# Patient Record
Sex: Female | Born: 1960 | Race: White | Hispanic: No | Marital: Married | Smoking: Never smoker
Health system: Southern US, Community
[De-identification: ages and names within clinical notes are randomized; demographics above are authoritative.]

## PROBLEM LIST (undated history)

## (undated) DIAGNOSIS — I10 Essential (primary) hypertension: Secondary | ICD-10-CM

## (undated) DIAGNOSIS — D219 Benign neoplasm of connective and other soft tissue, unspecified: Secondary | ICD-10-CM

## (undated) DIAGNOSIS — R768 Other specified abnormal immunological findings in serum: Secondary | ICD-10-CM

## (undated) DIAGNOSIS — G709 Myoneural disorder, unspecified: Secondary | ICD-10-CM

## (undated) DIAGNOSIS — K222 Esophageal obstruction: Secondary | ICD-10-CM

## (undated) DIAGNOSIS — E119 Type 2 diabetes mellitus without complications: Secondary | ICD-10-CM

## (undated) DIAGNOSIS — K219 Gastro-esophageal reflux disease without esophagitis: Secondary | ICD-10-CM

## (undated) DIAGNOSIS — E785 Hyperlipidemia, unspecified: Secondary | ICD-10-CM

## (undated) DIAGNOSIS — D259 Leiomyoma of uterus, unspecified: Secondary | ICD-10-CM

## (undated) DIAGNOSIS — E669 Obesity, unspecified: Secondary | ICD-10-CM

## (undated) DIAGNOSIS — Z87442 Personal history of urinary calculi: Secondary | ICD-10-CM

## (undated) DIAGNOSIS — Z8719 Personal history of other diseases of the digestive system: Secondary | ICD-10-CM

## (undated) HISTORY — DX: Essential (primary) hypertension: I10

## (undated) HISTORY — DX: Benign neoplasm of connective and other soft tissue, unspecified: D21.9

## (undated) HISTORY — DX: Obesity, unspecified: E66.9

## (undated) HISTORY — DX: Gastro-esophageal reflux disease without esophagitis: K21.9

## (undated) HISTORY — PX: COLONOSCOPY: SHX174

## (undated) HISTORY — DX: Hyperlipidemia, unspecified: E78.5

## (undated) HISTORY — DX: Leiomyoma of uterus, unspecified: D25.9

## (undated) HISTORY — DX: Other specified abnormal immunological findings in serum: R76.8

## (undated) HISTORY — PX: EXPLORATORY LAPAROTOMY: SUR591

## (undated) HISTORY — DX: Esophageal obstruction: K22.2

---

## 1999-05-27 ENCOUNTER — Other Ambulatory Visit: Admission: RE | Admit: 1999-05-27 | Discharge: 1999-05-27 | Payer: Self-pay | Admitting: Obstetrics and Gynecology

## 1999-08-23 ENCOUNTER — Encounter (INDEPENDENT_AMBULATORY_CARE_PROVIDER_SITE_OTHER): Payer: Self-pay

## 1999-08-23 ENCOUNTER — Ambulatory Visit (HOSPITAL_COMMUNITY): Admission: RE | Admit: 1999-08-23 | Discharge: 1999-08-23 | Payer: Self-pay | Admitting: Obstetrics and Gynecology

## 2000-02-12 ENCOUNTER — Encounter: Payer: Self-pay | Admitting: Family Medicine

## 2000-02-12 ENCOUNTER — Encounter: Admission: RE | Admit: 2000-02-12 | Discharge: 2000-02-12 | Payer: Self-pay | Admitting: Family Medicine

## 2000-06-30 ENCOUNTER — Other Ambulatory Visit: Admission: RE | Admit: 2000-06-30 | Discharge: 2000-06-30 | Payer: Self-pay | Admitting: Obstetrics and Gynecology

## 2001-07-23 ENCOUNTER — Other Ambulatory Visit: Admission: RE | Admit: 2001-07-23 | Discharge: 2001-07-23 | Payer: Self-pay | Admitting: Obstetrics and Gynecology

## 2002-08-23 ENCOUNTER — Other Ambulatory Visit: Admission: RE | Admit: 2002-08-23 | Discharge: 2002-08-23 | Payer: Self-pay | Admitting: Obstetrics and Gynecology

## 2003-09-18 ENCOUNTER — Other Ambulatory Visit: Admission: RE | Admit: 2003-09-18 | Discharge: 2003-09-18 | Payer: Self-pay | Admitting: Obstetrics and Gynecology

## 2004-10-28 ENCOUNTER — Other Ambulatory Visit: Admission: RE | Admit: 2004-10-28 | Discharge: 2004-10-28 | Payer: Self-pay | Admitting: Obstetrics and Gynecology

## 2005-06-23 ENCOUNTER — Ambulatory Visit: Payer: Self-pay | Admitting: Internal Medicine

## 2005-06-24 ENCOUNTER — Ambulatory Visit: Payer: Self-pay | Admitting: Cardiology

## 2005-07-10 ENCOUNTER — Ambulatory Visit: Payer: Self-pay | Admitting: Internal Medicine

## 2005-07-29 ENCOUNTER — Ambulatory Visit: Payer: Self-pay | Admitting: Internal Medicine

## 2005-09-08 HISTORY — PX: BREAST CYST EXCISION: SHX579

## 2006-01-29 ENCOUNTER — Encounter: Admission: RE | Admit: 2006-01-29 | Discharge: 2006-01-29 | Payer: Self-pay | Admitting: Obstetrics and Gynecology

## 2006-02-05 ENCOUNTER — Ambulatory Visit (HOSPITAL_COMMUNITY): Admission: RE | Admit: 2006-02-05 | Discharge: 2006-02-05 | Payer: Self-pay | Admitting: Obstetrics and Gynecology

## 2006-03-16 ENCOUNTER — Ambulatory Visit (HOSPITAL_BASED_OUTPATIENT_CLINIC_OR_DEPARTMENT_OTHER): Admission: RE | Admit: 2006-03-16 | Discharge: 2006-03-16 | Payer: Self-pay | Admitting: General Surgery

## 2006-03-16 ENCOUNTER — Encounter (INDEPENDENT_AMBULATORY_CARE_PROVIDER_SITE_OTHER): Payer: Self-pay | Admitting: General Surgery

## 2006-05-27 ENCOUNTER — Encounter: Admission: RE | Admit: 2006-05-27 | Discharge: 2006-05-27 | Payer: Self-pay | Admitting: Obstetrics and Gynecology

## 2006-09-08 HISTORY — PX: ABDOMINAL HYSTERECTOMY: SHX81

## 2007-05-04 ENCOUNTER — Encounter (INDEPENDENT_AMBULATORY_CARE_PROVIDER_SITE_OTHER): Payer: Self-pay | Admitting: Obstetrics and Gynecology

## 2007-05-04 ENCOUNTER — Ambulatory Visit (HOSPITAL_COMMUNITY): Admission: RE | Admit: 2007-05-04 | Discharge: 2007-05-05 | Payer: Self-pay | Admitting: Obstetrics and Gynecology

## 2007-08-11 ENCOUNTER — Ambulatory Visit (HOSPITAL_COMMUNITY): Admission: RE | Admit: 2007-08-11 | Discharge: 2007-08-11 | Payer: Self-pay | Admitting: Obstetrics and Gynecology

## 2007-09-03 ENCOUNTER — Ambulatory Visit: Payer: Self-pay | Admitting: Internal Medicine

## 2008-05-03 ENCOUNTER — Ambulatory Visit: Payer: Self-pay | Admitting: Cardiology

## 2010-06-19 ENCOUNTER — Encounter: Admission: RE | Admit: 2010-06-19 | Discharge: 2010-06-19 | Payer: Self-pay | Admitting: Obstetrics and Gynecology

## 2010-11-12 ENCOUNTER — Ambulatory Visit (INDEPENDENT_AMBULATORY_CARE_PROVIDER_SITE_OTHER): Payer: Self-pay | Admitting: Family Medicine

## 2010-11-12 DIAGNOSIS — Z713 Dietary counseling and surveillance: Secondary | ICD-10-CM

## 2011-01-21 NOTE — H&P (Signed)
NAMESUMIRE, HALBLEIB                 ACCOUNT NO.:  [id number]   MEDICAL RECORD NO.:  [id number]          PATIENT TYPE:  OIB   LOCATION:  9313                          FACILITY:  [facility]   PHYSICIAN:  Juluis Mire, M.D.   DATE OF BIRTH:  [birth year]   DATE OF ADMISSION:  05/04/2007  DATE OF DISCHARGE:                              HISTORY & PHYSICAL   The patient is a 50 year old nulligravida female presents for LAVH,  possible BSO.   HISTORY OF PRESENT ILLNESS:  The patient has had a longstanding history  of chronic pelvic pain.  She had previous laparoscopy and hysteroscopy  with findings of small fibroids and findings suggestive of adenomyosis  as well as some minimal pelvic endometriosis.  She is continued to have  pain and discomfort despite attempts at medical management.  Because of  this and increasing bleeding the patient now presents for laparoscopic-  assisted vaginal hysterectomy.  Recently she has been on Depo-Provera  and again with no response in terms of her pain.  She has had a  urological workup has revealed some minimal interstitial cystitis and  has been under evaluation by Loraine Leriche C. Vernie Ammons, M.D. and managed, she also  a complete GI evaluation with negative findings.   ALLERGIES:  The patient is allergic to DARVOCET.   MEDICATIONS:  Including Lipitor, multivitamins and calcium.   PAST MEDICAL HISTORY:  She had usual childhood disease without any  significant sequelae.  It is of note she does have that history of  interstitial cystitis for which she is under management.  She is also  had a history of cervical dysplasia treated cryotherapy in the past.   PAST SURGICAL HISTORY:  She has had a previous hysteroscopy, laparoscopy  in 2003 and has had previous breast surgery.   FAMILY HISTORY:  Mother has a history of hypertension.  Mother with  history of diabetes and heart disease.  Father has a history of heart  disease also.   SOCIAL HISTORY:  There is no tobacco or  alcohol use.   REVIEW OF SYSTEMS:  Noncontributory.   PHYSICAL EXAMINATION:  VITAL SIGNS:  The patient is afebrile, stable  vital signs.  HEENT:  Patient is normocephalic.  Pupils equal, round and reactive to  light and accommodation.  Extraocular movements were intact.  Sclerae  and conjunctivae are clear.  Oropharynx clear.  NECK:  Without thyromegaly.  BREASTS:  No discrete masses.  LUNGS:  Clear.  HEART:  Regular rhythm and rate without murmurs or gallops.  ABDOMEN:  Benign.  No mass, organomegaly or tenderness.  PELVIC:  Normal external genitalia.  Vaginal mucosa is clear.  Cervix  unremarkable.  Uterus upper limits of normal size irregular.  Adnexa  unremarkable.  EXTREMITIES:  Trace edema.  NEUROLOGY:  Grossly within normal limits.   IMPRESSION:  1. Continued pelvic pain and abnormal bleeding unresponsive      conservative management, possible adenomyosis.  2. Uterine fibroids.  3. Interstitial cystitis.  4. Hypercholesterolemia.   PLAN:  The patient will undergo laparoscopic assisted vaginal  hysterectomy, possible bilateral salpingo-oophorectomy.  If  ovaries are  removed the patient is understand the potential need for hormone  replacement therapy and its inherent risks.  The risks of surgery  explained including the risk of infection.  The risk of hemorrhage that  could require transfusion with the risk of AIDS or hepatitis.  Risk of  injury to adjacent organs including bladder, bowel, ureters that could  require further exploratory surgery.  Risk of deep venous thrombosis and  pulmonary emboli.  The patient does understand indications and risks.      Juluis Mire, M.D.  Electronically Signed     JSM/MEDQ  D:  05/04/2007  T:  05/04/2007  Job:  [id number]

## 2011-01-21 NOTE — Assessment & Plan Note (Signed)
[facility]                            CARDIOLOGY OFFICE NOTE   NAME:[name], Summer KAMAKA                        MRN:          [id number]  DATE:05/03/2008                            DOB:          08-Nov-1960    I was asked by Dr. Richardean Chimera to do a cardiovascular risk assessment on  [name].  She is 50 years of age and has a very strong family  history of coronary disease.  Her father specifically died of a massive  heart attack at age 89, was a nonsmoker.  She has a sister, at around  age 66 had several coronary stents placed.  She also had a brother who  died of heart attack at age 46, but he had diabetes, hyperlipidemia, and  hypertension.   I came to find out, she is on Crestor 5 mg a day with Dr. Nadyne Coombes.  She is due for followup blood work in about 4-6 weeks.  She has been on  Lipitor for about 5 years and her last lipid profile on 10 of Lipitor  showed a total cholesterol of 155, HDL 41, triglycerides of 93, LDL of  95.  Her HDL in the past has been in the low 30s.  Her LFTs were normal.   Fortunately she does not smoke.  She exercises on a regular basis,  walking daily greater than 3 hours a week.  She has no diabetes.  She  has no history of hypertension.   PAST MEDICAL HISTORY:  She is currently on Crestor 5 mg a day, she is on  81 mg of aspirin.  She is on calcium, multivitamin, and imipramine 25  mg.   ALLERGIES:  She has intolerance to DARVOCET, it causes nausea.  She has  no history of dye allergy.   PAST SURGICAL HISTORY:  She had a hysterectomy in August 2008 and breast  cyst removal in July 2007.   FAMILY HISTORY:  As above.   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  She is married and has no children.   REVIEW OF SYSTEMS:  All negative.  All systems queried.   PHYSICAL EXAMINATION:  VITAL SIGNS:  Her blood pressure today is 114/76,  her pulse was 107, she is kind of anxious.  She  says her heart rate is  usually normal.  She has had recent CBC and thyroid profile.  Her weight  is 153.  She is 5 feet and 3 inches.  GENERAL:  She has a plethoric color to her face, which might be just  from anxiety.  She is in no acute distress.  Alert and oriented x3.  SKIN:  Warm and dry.  HEENT:  Otherwise is negative.  Carotid upstrokes are equal bilaterally  without bruits.  No JVD.  Thyroid is not enlarged.  Trachea is midline.  LUNGS:  Clear.  HEART:  Reveals a regular rate and rhythm.  No gallop.  ABDOMEN:  Soft.  Good bowel sounds.  No midline bruit.  EXTREMITIES:  No cyanosis, clubbing,  or edema.  Pulses are intact.  NEURO:  Intact.   Electrocardiogram today shows sinus tachycardia, otherwise normal.   I have had a long talk with [name] today.  She is doing all what she  can to reduce her risk of cardiovascular disease in the future.  She is  being managed by Dr. Nadyne Coombes who does an excellent job.  I have  advised to get followup lipids on 5 of Crestor.  Her goal LDL, I would  treat at 70 or as close to 70 as possible.  Hope her HDL will remain  higher than it has in the past.  I will see her back on a p.r.n. basis.     Thomas C. Daleen Squibb, MD, [facility]  Electronically Signed    TCW/MedQ  DD: 05/03/2008  DT: 05/04/2008  Job #: [id number]   cc:   Juluis Mire, M.D.  Marcos Eke. Hal Hope, M.D.

## 2011-01-21 NOTE — Assessment & Plan Note (Signed)
[facility]                         GASTROENTEROLOGY OFFICE NOTE   NAME:[name], [name]                        MRN:          [id number]  DATE:09/03/2007                            DOB:          03-11-1961    REFERRING PHYSICIAN:  Juluis Mire, M.D.   REASON FOR CONSULTATION:  Chronic abdominal pain and abnormal CT scan.   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 Arelia Sneddon regarding left-sided  abdominal pain and abnormal CT scan. The patient has had chronic left-  sided pain for at least 8 years. She was evaluated in this office in  October 2006 for the same. At that time, she underwent laboratories and  urinalysis. These were unremarkable. She also underwent a CT scan of the  abdomen and pelvis. She was noted to have a uterine fibroid. No other  abnormalities. The rectosigmoid colon was incompletely opacified. She  subsequently underwent complete colonoscopy July 29, 2005. This was  entirely normal including visualization of the ileum. She was not  suspected to have a GI cause for her pain. Since that time, she tells me  that she underwent hysterectomy in hopes of alleviating the pain.  Unfortunately this did not help. She does have a history of  endometriosis for which she underwent surgical therapy about 8 years  ago. She underwent repeat CT scan at [facility] August 11, 2007.  There was a left-sided ovarian cyst. As well, a suggestion of thickening  of the sigmoid colon which have possibly been related to under  distention of the organ. In terms of the patient's discomfort, she tells  me that it occurs daily and has so for years. She describes it as a  knife twisting in her side. The intensity can vary but generally bothers  her most of the day. She sleeps okay without pain disrupting her sleep  pattern. She has taken Advil which eases the discomfort. She has also  tried  Vicodin though this makes her a bit too sleepy. She describes her  bowel habits as normal. Despite this, she was given fiber and MiraLax to  see if this would help the pain. No effect on the pain. She has had  gradual weight gain since her last office visit here though recently  losing some weight by intent as she is on a diet. No problems with back  pain. No urinary complaints, no bleeding.   CURRENT MEDICATIONS:  Lipitor, baby aspirin and calcium. She is  intolerant to DARVOCET which cause nausea.   PAST MEDICAL HISTORY:  1. Hyperlipidemia.  2. Endometriosis status post laparoscopy about 8 years ago.  3. Status post hysterectomy.   FAMILY HISTORY:  Negative for gastrointestinal disorders.   SOCIAL HISTORY:  The patient is married without children. 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   PHYSICAL EXAMINATION:  Well-appearing female in no acute distress.  Blood pressure is 122/70, heart rate 72, weight is 148.2 pounds.  HEENT:  Sclera anicteric, conjunctiva are pink.  LUNGS:  Clear.  HEART:  Regular.  ABDOMEN:  Soft. There is regional tenderness in the left mid abdomen,  left lower quadrant, in the muscle layer. No mass, good bowel sounds  heard.  EXTREMITIES:  Without edema.   IMPRESSION:  1. Abdominal wall tenderness. The patient's chronic pain is muscular      wall tenderness and not a primary gastrointestinal disorder.  2. Questionable thickening of the sigmoid colon on CT scan. A similar      finding two years ago with subsequent negative colonoscopy. Indeed,      I believe this is artifact secondary under distention, as      suggested, rather than true pathology.   RECOMMENDATIONS:  The patient can consider a heating pad to her abdomen.  As well, she can be given symptomatic therapies per her primary care  physician or even be referred to a pain clinic if these measures are not  effective and the discomfort affects her quality of  life. She needs no  further GI evaluation or therapies.     Wilhemina Bonito. Marina Goodell, MD  Electronically Signed    JNP/MedQ  DD: 09/03/2007  DT: 09/03/2007  Job #: [id number]   cc:   Juluis Mire, M.D.  Marcos Eke. Hal Hope, M.D.

## 2011-01-21 NOTE — Op Note (Signed)
[name], [name]                 ACCOUNT NO.:  [id number]   MEDICAL RECORD NO.:  [id number]          PATIENT TYPE:  OIB   LOCATION:  9313                          FACILITY:  [facility]   PHYSICIAN:  Juluis Mire, M.D.   DATE OF BIRTH:  19-Nov-1960   DATE OF PROCEDURE:  05/04/2007  DATE OF DISCHARGE:                               OPERATIVE REPORT   PREOPERATIVE DIAGNOSIS:  Menorrhagia, dysmenorrhea unresponsive  conservative management with known uterine fibroids.   POSTOPERATIVE DIAGNOSIS:  Menorrhagia, dysmenorrhea unresponsive  conservative management with known uterine fibroids.  Pathology pending.   PROCEDURES:  Laparoscopic-assisted vaginal hysterectomy.   SURGEON:  Juluis Mire, M.D.   ASSISTANT:  Zelphia Cairo, M.D.   ESTIMATED BLOOD LOSS:  Was 200 mL.   PACKS AND DRAINS:  None.   BLOOD REPLACED:  None.   COMPLICATIONS:  None.   INDICATIONS:  Noted in history and physical.   DESCRIPTION OF PROCEDURE:  The patient was taken to OR, placed supine  position.  After satisfactory level of general endotracheal anesthesia  obtained, the patient was placed in dorsal lithotomy position using the  Allen stirrups.  The abdomen, perineum and vagina were prepped out  Betadine.  A Hulka tenaculum put in place.  Bladder was emptied by in-  and-out catheterization.  Patient draped in sterile field.  A  subumbilical incision made knife extend to the fascia. Fascia was  entered sharply and incision to fascia extended laterally. We were able  to separate the rectus muscles into the peritoneum with blunt pressure.  An open laparoscopic trocar was put in place and secured. The abdomen  inflated with carbon dioxide.  Laparoscope was introduced.  A 5-mm  trocar was put in place in suprapubic area under direct visualization.  Visualization revealed uterus be enlarged with multiple fibroids.  Tubes  and ovaries were unremarkable with no evidence of pelvic pathology.  The  view of the upper  abdomen was benign, liver was smooth and unremarkable,  tip the gallbladder was seen and the appendix was noted be normal.  This  point time using the gyrus the right ovarian pedicle was cauterized  incised, the right tube and mesosalpinx as well as the right round  ligament cauterized incised.  Then went the left side with left utero-  ovarian pedicle was cauterized incised, the left tube and mesosalpinx  cauterized incised, the left round ligament cauterized incised. We had  good freeing of the uterus and good hemostasis.   Laparoscope was removed.  The abdomen was deflated of carbon dioxide.  The Hulka tenaculum was then taken out, the patient's legs were  repositioned.  Weighted speculum placed in vaginal vault.  The cervix  was grasped with Christella Hartigan tenaculum.  Cul-de-sac was entered sharply.  Both uterosacral ligaments were clamped, cut and suture ligated 0  Vicryl.  Reflection vaginal mucosa anteriorly was incised and bladder  was dissected superiorly. Using clamp, cut and tie technique with suture  ligature of 0 Vicryl the parametrium serially separated from side the  uterus. Vesicouterine space was entered and retractors put in place.  Uterus was then flipped, remaining pedicles were clamped and cut.  Uterus and cervix were passed off the operative field sent to pathology.  Held pedicles secured with three free ties of 0 Vicryl.  Uterosacral  plication stitch of 0 Vicryl was put in place and secured.  Vaginal  mucosa was closed with figure-of-eights of 0 Monocryl.  We had good  hemostasis and Foley was placed straight drain with clear urine output.   Laparoscope was reintroduced and abdomen inflated carbon dioxide.  Visualization revealed basically good hemostasis in all areas, small  amount and noted from the right side of the vaginal cuff brought under  control with the gyrus.  This point time and abdomen deflated carbon  dioxide.  All trocars removed.  Subumbilical fascia  closed figure-of-  eight of 0 Vicryl, skin with interrupted subcuticulars of 4-0 Vicryl.  Suprapubic incision closed Dermabond.  Sponge, instrument, and needle  count was correct by circulating nurse x2.  The patient tolerated the  procedure well and once alert and extubated, transferred to recovery  room in good condition.      Juluis Mire, M.D.  Electronically Signed     JSM/MEDQ  D:  05/04/2007  T:  05/04/2007  Job:  [id number]

## 2011-01-21 NOTE — Discharge Summary (Signed)
[name], [name]                 ACCOUNT NO.:  [id number]   MEDICAL RECORD NO.:  [id number]          PATIENT TYPE:  OIB   LOCATION:  9313                          [facility]ACILITY[facility]   PHYSICIAN:  Juluis Mire, M.D.   DATE OF BIRTH:  08/10/1961   DATE OF ADMISSION:  05/04/2007  DATE OF DISCHARGE:  05/05/2007                               DISCHARGE SUMMARY   POSTOPERATIVE DIAGNOSIS:  Uterine fibroids.   POSTOPERATIVE DIAGNOSIS:  Uterine fibroids.   OPERATIVE PROCEDURE:  Laparoscopic-assisted vaginal hysterectomy.   For complete history and physical, please see dictated note.   COURSE IN THE HOSPITAL:  The patient under the above-noted surgery.  Postoperatively, did well.  Postoperative hemoglobin was 12.1.  Discharged home the first postoperative day.  Pathology is still  pending.  At the time of discharge she was afebrile with stable vital  signs.  She was tolerating a regular diet and voiding without  difficulty.  Her abdominal exam is benign, all incisions were intact,  and bowel sounds were active.  She had no active vaginal bleeding.   COMPLICATIONS:  None encountered during her stay in the hospital.  The  patient is discharged home in stable condition.   DISPOSITION:  Routine postoperative instructions and orders were given.  She is to avoid heavy lifting, vaginal entrance, and should be careful  driving a car.  She should watch for signs of infection, nausea,  vomiting, increasing abdominal pain, or active vaginal bleeding.  She  also instructed to watch for signs of deep venous thrombosis and  pulmonary embolus.  Follow up in the office in 1 week.  Discharged home  on Vicodin as she needs for pain.      Juluis Mire, M.D.  Electronically Signed     JSM/MEDQ  D:  05/05/2007  T:  05/05/2007  Job:  [id number]

## 2011-01-24 NOTE — Op Note (Signed)
NAMEJULLIANNA, [name]                 ACCOUNT NO.:  [id number]   MEDICAL RECORD NO.:  [id number]          PATIENT TYPE:  AMB   LOCATION:  DSC                          FACILITY:  [facility]   PHYSICIAN:  Gabrielle Dare. Janee Morn, M.D.DATE OF BIRTH:  [birth year]   DATE OF PROCEDURE:  DATE OF DISCHARGE:                                 OPERATIVE REPORT   PREOPERATIVE DIAGNOSIS:  Right breast mass.   POSTOPERATIVE DIAGNOSIS:  Right breast mass.   PROCEDURES:  Right breast biopsy   SURGEON:  Violeta Gelinas, MD   HISTORY OF PRESENT ILLNESS:  The patient is a 50 year old female who I  evaluated for a right breast mass deep to the inferior edge of her areola.  Mammographic findings were indeterminate.  However due to the superficial  nature of it, surgical resection was recommended.   PROCEDURE IN DETAIL:  Informed consent was obtained.  The patient's site was  marked.  She received intravenous antibiotics.  She was brought to the  operating room.  MAC anesthesia was administered.  Her right breast was  prepped and draped in sterile fashion.  A mixture of 0.25% Marcaine and 1%  lidocaine was injected along the inferior portion of her areola.  The  incision was made along the lower edge of her areola.  Subcutaneous tissues  were dissected down.  This area was easily palpable.  It was  circumferentially dissected and about 5-6 mm in size.  A small extra piece  of tissue was also taken both superficial and deep to this area.  Specimens  were sent to pathology.  The wound was copiously irrigated.  Some additional  local anesthetic was injected.  Further palpation revealed no other  abnormalities.  Meticulous hemostasis was assured and the wound was closed  in two layers with the subcutaneous tissues approximated with interrupted 3-  0 Vicryl sutures and the skin closed with a running 4-0 Monocryl  subcuticular stitch.  Sponge, needle and instrument counts were correct.  Benzoin, Steri-Strips and sterile  dressings were applied.  The patient  tolerated the procedure well without apparent complication and was taken  recovery in stable condition.      Gabrielle Dare Janee Morn, M.D.  Electronically Signed     BET/MEDQ  D:  03/16/2006  T:  03/16/2006  Job:  [id number]   cc:   Bryan Lemma. Manus Gunning, M.D.  Fax: [phone]   Juluis Mire, M.D.  Fax: [phone]

## 2011-01-24 NOTE — H&P (Signed)
[name], [name]                 ACCOUNT NO.:  [id number]   MEDICAL RECORD NO.:  [id number]          PATIENT TYPE:  AMB   LOCATION:  SDC                           FACILITY[facility]   PHYSICIAN:  Juluis Mire, M.D.   DATE OF BIRTH:  Jun 20, 1961   DATE OF ADMISSION:  02/05/2006  DATE OF DISCHARGE:                                HISTORY & PHYSICAL   The patient is a 50 year old nulligravida married female who presents for  laparoscopic evaluation.   In relation to the present admission, the patient has a history of chronic  left lower quadrant discomfort.  Previous laparoscopic evaluation had been  performed in 2000 with finding of endometriosis that was treated.  She also  had a hysteroscopy at that time.  Pain has become more persistent and more  uncomfortable.  She has undergone GI evaluation with negative findings.  She  is being managed for interstitial cystitis, but this remains relatively  asymptomatic.  She has undergone ultrasound evaluation, which had been  unremarkable.  In view of the continued pain and discomfort, the patient  wished to have repeat laparoscopy to evaluate for recurrent endometriosis,  for which she is brought in at the present time.   In terms of allergies, she is allergic to DARVOCET.   MEDICATIONS:  1.  Lipitor 10 mg a day.  2.  Yasmin for birth control purposes.   PAST SURGICAL AND OBSTETRICAL HISTORY:  Past surgical history includes the  noted laparoscopy and hysteroscopy.  She has no obstetrical history.   FAMILY HISTORY:  Mother has a history of hypertension.  Father and brother  have what is described as heart disease, and brother has diabetes.   SOCIAL HISTORY:  No tobacco or alcohol use.   REVIEW OF SYSTEMS:  Noncontributory.   PHYSICAL EXAMINATION:  VITAL SIGNS:  The patient is afebrile with stable  vital signs.  HEENT:  The patient is normocephalic.  Pupils equal, round and reactive to  light and accommodation.  Extraocular movements  were intact.  Sclerae and  conjunctivae were clear.  Oropharynx clear.  NECK:  Without thyromegaly.  LUNGS:  Clear.  CARDIAC:  Regular rhythm and rate.  No murmurs or gallops.  ABDOMEN:  Benign.  No mass, organomegaly or tenderness.  PELVIC:  Normal external genitalia.  Vaginal mucosa clear.  Cervix  unremarkable.  Uterus normal size, shape and contour.  Adnexa free of masses  or tenderness.  EXTREMITIES:  Trace edema.  NEUROLOGIC:  Grossly within normal limits.   IMPRESSION:  1.  Persistent pelvic pain, possible recurrent endometriosis.  2.  Hypercholesterolemia.   PLAN:  The patient will undergo laparoscopic evaluation with a laser  standby.  The risks of surgery have been discussed, including the risk of  infection; the risk of hemorrhage that could require transfusion and the  risk of AIDS or hepatitis; the risk of injury to adjacent organs including  bladder, bowel or ureters that could require further exploratory surgery;  the risk of deep venous thrombosis and pulmonary embolus.  The patient  expressed an understanding of indications and  risks.      Juluis Mire, M.D.  Electronically Signed     JSM/MEDQ  D:  02/05/2006  T:  02/05/2006  Job:  [id number]

## 2011-01-24 NOTE — Op Note (Signed)
[name], [name]                 ACCOUNT NO.:  [id number]   MEDICAL RECORD NO.:  [id number]          PATIENT TYPE:  AMB   LOCATION:  SDC                           FACILITY:  [facility]   PHYSICIAN:  Juluis Mire, M.D.   DATE OF BIRTH:  1961-03-06   DATE OF PROCEDURE:  02/05/2006  DATE OF DISCHARGE:                                 OPERATIVE REPORT   PREOPERATIVE DIAGNOSIS:  Chronic pelvic pain, rule out endometriosis.   POSTOPERATIVE DIAGNOSIS:  Multiple uterine fibroids.   PROCEDURE:  Open laparoscopy.   SURGEON:  Juluis Mire, M.D.   ANESTHESIA:  General.   ESTIMATED BLOOD LOSS:  Minimal.   PACKS AND DRAINS:  None.   INTRAOPERATIVE BLOOD REPLACED:  None.   COMPLICATIONS:  None.   INDICATIONS FOR PROCEDURE:  As dictated in the history and physical.   DESCRIPTION OF PROCEDURE:  The patient was taken to the OR and placed in the  supine position.  After a satisfactory level of general anesthesia was  obtained, the patient was placed in the dorsal lithotomy position using the  Allen stirrups.  The abdomen, perineum, and vagina were prepped out with  Betadine.  The bladder was emptied via catheterization.  A Hulka tenaculum  was put in place and secured.  The patient was then draped as a sterile  field.   A subumbilical incision was made with the knife and extended to the  subcutaneous tissue.  The fascia was identified and entered sharply and the  incision in the fascia extended laterally.  The peritoneum was entered with  blunt pressure.  The laparoscopic trocar was put in place and secured.  The  laparoscope was introduced.  There was no evidence of injury to adjacent  organs.  A 5-mm trocar was put in place in the suprapubic area under direct  visualization.  Visualization revealed both ovaries to be normal.  The  uterus was enlarged with multiple fibroids.  There was no active  endometriosis or other signs of pelvic pathology.  The appendix was  visualized and noted to  be normal.  The upper abdomen, including the liver  and tip of the gallbladder were clear.  At this point in time, the abdomen  was deflated of the carbon dioxide.  All trocars were removed.  The  suprapubic incision fascia was closed with two figure-of-eight sutures of 0  Vicryl, the skin with interrupted subcuticular sutures of 4-0 Vicryl, and  the suprapubic incision was closed with Dermabond.  The Hulka tenaculum was  then removed.   The patient was taken out of the dorsal lithotomy position and once alert  and extubated was transferred to the recovery room in good condition.  Sponge, instrument, and needle counts were reported as correct by the  circulating nurse.      Juluis Mire, M.D.  Electronically Signed     JSM/MEDQ  D:  02/05/2006  T:  02/05/2006  Job:  [id number]

## 2011-03-26 ENCOUNTER — Ambulatory Visit: Payer: Self-pay | Admitting: *Deleted

## 2011-04-03 ENCOUNTER — Encounter: Payer: Self-pay | Admitting: *Deleted

## 2011-04-03 ENCOUNTER — Encounter: Payer: 59 | Attending: Family Medicine | Admitting: *Deleted

## 2011-04-03 DIAGNOSIS — R7309 Other abnormal glucose: Secondary | ICD-10-CM | POA: Insufficient documentation

## 2011-04-03 DIAGNOSIS — I10 Essential (primary) hypertension: Secondary | ICD-10-CM | POA: Insufficient documentation

## 2011-04-03 DIAGNOSIS — Z713 Dietary counseling and surveillance: Secondary | ICD-10-CM | POA: Insufficient documentation

## 2011-04-03 NOTE — Patient Instructions (Addendum)
Goals:  1200-1400 calories per day   Follow Diabetes Meal Plan as instructed (yellow card).  Eat 3 meals and 2 snacks, every 3-5 hrs  Limit sodium to <2000 mg and cholesterol to <300 mg daily  Aim for 25-30 g of fiber daily  Add lean protein foods to meals/snacks  Monitor glucose levels as instructed by your doctor  Aim for >30-45 mins of physical activity daily  Bring glucose log and food record to your next nutrition visit

## 2011-04-03 NOTE — Progress Notes (Signed)
  Medical Nutrition Therapy:  Appt start time: 0730 end time:  0830.   Assessment:  Primary concerns today: Pre-DM, Hypertension, Elevated TG. Pt here through [employer] for weight mgmt and achievement of prediabetes mgmt goals.  Pt has elevated BP (143/91 on 02/24/11), TGs (225 mg/dL on 16/1/09), and an U0A of 6.4% (11/13/2010).  Checks BG BID; fasting and 2 hrs post-prandial (rotates between lunch/dinner). Recent FBGs: 95-120 mg/dL; PP: 540-981 mg/dL.  Reports dietary changes including decreased soda from 5-6 cans to 1 can a day ~6 mos ago. Pt exercises regularly and does not eat increased amounts of red meat.   MEDICATIONS/SUPPLEMENTS: Losartan-HCTZ, buproprion, ASA 81mg , imipramine, crestor, nexium, caltrate 600+D    DIETARY INTAKE:  Usual eating pattern includes 3 meals and 1-2 snacks per day.  24-hr recall:  B ( AM): bagel w/ cream cheese; orange Hi-C juice box  Snk ( AM): apple  L ( PM): Chicken pot pie, grapes, 12 oz pepsi Snk ( PM): BBQ chips (1-2 oz) D ( PM): Lean Cuisine (spaghetti) Snk ( PM): none *Pt reports additional intake of ~50 oz of water daily.  Usual physical activity:  Walks 3-5 days/week from 30-60 min.  Estimated Nutrient Needs: 1200-1400 calories 140-165 g carbohydrates 85-95 g protein 35-40 g fat <10-12 g saturated fat 25-30 g fiber <2000 mg sodium <300 mg dietary cholesterol  Progress Towards Goal(s):  NEW.   Nutritional Diagnosis:  [place]-2.1 Inpaired nutrition utilization related to glucose as evidenced by A1c of 6.4% and diagnosis of pre-diabetes.    Intervention/Goals:  1200-1400 calories per day   Follow Diabetes Meal Plan as instructed (yellow card).  Eat 3 meals and 2 snacks, every 3-5 hrs  Limit sodium to <2000 mg and cholesterol to <300 mg daily  Aim for 25-30 g of fiber daily  Add lean protein foods to meals/snacks  Monitor glucose levels as instructed by your doctor  Aim for >30-45 mins of physical activity  daily  Bring glucose log and food record to your next nutrition visit  Monitoring/Evaluation:  Dietary intake, exercise, A1c, BG, and body weight in 6 week(s).

## 2011-05-07 ENCOUNTER — Ambulatory Visit: Payer: Self-pay | Admitting: Internal Medicine

## 2011-05-19 ENCOUNTER — Ambulatory Visit: Payer: Self-pay | Admitting: Internal Medicine

## 2011-05-19 ENCOUNTER — Ambulatory Visit: Payer: 59 | Admitting: *Deleted

## 2011-05-20 ENCOUNTER — Ambulatory Visit: Payer: Self-pay | Admitting: Internal Medicine

## 2011-05-20 ENCOUNTER — Encounter: Payer: Self-pay | Admitting: Internal Medicine

## 2011-05-20 ENCOUNTER — Ambulatory Visit (INDEPENDENT_AMBULATORY_CARE_PROVIDER_SITE_OTHER): Payer: 59 | Admitting: Internal Medicine

## 2011-05-20 VITALS — BP 138/74 | HR 72 | Ht 63.0 in | Wt 184.2 lb

## 2011-05-20 DIAGNOSIS — K219 Gastro-esophageal reflux disease without esophagitis: Secondary | ICD-10-CM

## 2011-05-20 DIAGNOSIS — R059 Cough, unspecified: Secondary | ICD-10-CM

## 2011-05-20 DIAGNOSIS — R1310 Dysphagia, unspecified: Secondary | ICD-10-CM

## 2011-05-20 DIAGNOSIS — R05 Cough: Secondary | ICD-10-CM

## 2011-05-20 NOTE — Patient Instructions (Signed)
EGD [facility] 07/04/11 11:00 am arrive at 10:00 am on 4th floor EGD brochure given for you to read.

## 2011-05-20 NOTE — Progress Notes (Signed)
HISTORY OF PRESENT ILLNESS:  [name] is a 50 y.o. female with the below listed medical history who presents today for evaluation of dysphagia.She reports being on Nexium 40 mg daily, presumably for GERD, for the past 12 - 18 months. She now reports a 6 month history of intermittent solid food dysphagia with transient impaction requiring self induced emesis for relief. No active GERD symptoms and stable weight. The frequency of dysphagia has not progressed.She does report having had her Nexium increased to bid, about a month ago, in an effort to treat chronic cough ( > 1 year) of uncertain cause (? GERD). GI ROS is otherwise negative. Prior colonoscopy 07-2005, to evaluate lower abdominal / pelvic pain, was normal.  REVIEW OF SYSTEMS:  All non-GI ROS negative except for cough.  Past Medical History  Diagnosis Date  . Hyperlipidemia   . Hypertension   . Pre-diabetes   . Obesity (BMI 30-39.9)   . Endometriosis     Past Surgical History  Procedure Date  . Abdominal hysterectomy 2008  . Breast surgery 2007    Removed cyst   . Exploratory laparotomy     Social History [name]  reports that she has never smoked. She has never used smokeless tobacco. She reports that she drinks alcohol. She reports that she does not use illicit drugs.  family history includes Diabetes in her brother and mother; Early death in her father; Heart disease in her brothers and sister; Hypertension in her father; and Stroke in her brother.  There is no history of Colon cancer.  Allergies  Allergen Reactions  . Darvocet (Propoxyphene N-Acetaminophen) Nausea Only       PHYSICAL EXAMINATION: Vital signs: BP 138/74  Pulse 72  Ht 5\' 3"  (1.6 m)  Wt 184 lb 3.2 oz (83.553 kg)  BMI 32.63 kg/m2  Constitutional: generally well-appearing, no acute distress Psychiatric: alert and oriented x3, cooperative Eyes: extraocular movements intact, anicteric, conjunctiva pink Mouth: oral pharynx moist, no  lesions Neck: supple no lymphadenopathy Cardiovascular: heart regular rate and rhythm, no murmur Lungs: clear to auscultation bilaterally Abdomen: soft,obese, nontender, nondistended, no obvious ascites, no peritoneal signs, normal bowel sounds, no organomegaly Extremities: no lower extremity edema bilaterally Skin: no lesions on visible extremities Neuro: No focal deficits.    ASSESSMENT:  #1. Dysphagia. Rule out stricture or ring. #2. GERD. No classic esophageal symptoms on Nexium #3. Cough. Unclear cause. No response to bid PPI  PLAN:  #1. EGD with esophageal dilation.The nature of the procedure, as well as the risks, benefits, and alternatives were carefully and thoroughly reviewed with the patient. Ample time for discussion and questions allowed. The patient understood, was satisfied, and agreed to proceed.  #2. Continue bid PPI for an additional 2 months. If no improvement in cough, return to lowest dose of PPI to control classic GERD symptoms and return to Dr. Hal Hope for further assessment of cough

## 2011-05-27 ENCOUNTER — Encounter: Payer: 59 | Attending: Family Medicine | Admitting: *Deleted

## 2011-05-27 ENCOUNTER — Encounter: Payer: Self-pay | Admitting: *Deleted

## 2011-05-27 DIAGNOSIS — R7309 Other abnormal glucose: Secondary | ICD-10-CM | POA: Insufficient documentation

## 2011-05-27 DIAGNOSIS — I10 Essential (primary) hypertension: Secondary | ICD-10-CM | POA: Insufficient documentation

## 2011-05-27 DIAGNOSIS — Z713 Dietary counseling and surveillance: Secondary | ICD-10-CM | POA: Insufficient documentation

## 2011-05-27 NOTE — Patient Instructions (Addendum)
Goals:  Continue previous nutrition goals.   Increase exercise to previous level.   See ADA's website (www.diabetes.org) for good recipe ideas.

## 2011-05-27 NOTE — Progress Notes (Signed)
  Medical Nutrition Therapy:  Appt start time:  5:30p  End time:  6:00p.  Assessment:  Primary concerns today: Pre-DM, Hypertension, Elevated TG. Pt here for follow up.  Reports elevated GERD sx recently and increased Nexium.  Consumes ~125-130 oz water daily and has d/c'd all sodas. Reports FBGs of 105-112 mg/dL and slightly decreased exercise.  Requests ideas for dinners other than chicken.   MEDICATIONS:  Nexium increased    DIETARY INTAKE:  Usual eating pattern includes 3 meals and 1-2 snacks per day.  Usual physical activity:  Walks 3 days/week for 30 min.  Estimated Nutrient Needs: 1200-1400 calories 140-165 g carbohydrates 85-95 g protein 35-40 g fat <10-12 g saturated fat 25-30 g fiber <2000 mg sodium <300 mg dietary cholesterol  Progress Towards Goal(s):  Some progress - continue.   Nutritional Diagnosis:  [place]-2.1 Impaired nutrient utilization related to previously excessive CHO intake as evidenced by A1c of 6.4% and diagnosis of pre-diabetes.    Intervention/Goals:  Continue previous nutrition goals.   Increase exercise to previous level.   See ADA's website (www.diabetes.org) for good recipe ideas.  Monitoring/Evaluation:  Dietary intake, exercise, A1c, BGs, and body weight in 6 week(s).

## 2011-05-29 ENCOUNTER — Encounter: Payer: Self-pay | Admitting: *Deleted

## 2011-06-20 LAB — CBC
HCT: 34.4 — ABNORMAL LOW
HCT: 39.8
Hemoglobin: 12.1
MCHC: 35.3
MCV: 89.6
RBC: 3.84 — ABNORMAL LOW
RBC: 4.44
RDW: 13.4
WBC: 11.1 — ABNORMAL HIGH
WBC: 7.9

## 2011-06-20 LAB — HCG, SERUM, QUALITATIVE: Preg, Serum: NEGATIVE

## 2011-06-30 ENCOUNTER — Encounter: Payer: Self-pay | Admitting: Internal Medicine

## 2011-07-04 ENCOUNTER — Ambulatory Visit (AMBULATORY_SURGERY_CENTER): Payer: 59 | Admitting: Internal Medicine

## 2011-07-04 ENCOUNTER — Encounter: Payer: Self-pay | Admitting: Internal Medicine

## 2011-07-04 VITALS — BP 148/87 | HR 111 | Temp 97.8°F | Resp 16 | Ht 63.0 in | Wt 184.0 lb

## 2011-07-04 DIAGNOSIS — K222 Esophageal obstruction: Secondary | ICD-10-CM

## 2011-07-04 DIAGNOSIS — R1310 Dysphagia, unspecified: Secondary | ICD-10-CM

## 2011-07-04 DIAGNOSIS — K220 Achalasia of cardia: Secondary | ICD-10-CM

## 2011-07-04 DIAGNOSIS — K219 Gastro-esophageal reflux disease without esophagitis: Secondary | ICD-10-CM

## 2011-07-04 MED ORDER — SODIUM CHLORIDE 0.9 % IV SOLN: 500.0000 mL | INTRAVENOUS | Status: DC

## 2011-07-04 NOTE — Patient Instructions (Signed)
Please read the handouts given to you by your recovery room nurse.    Take your nexium every day as directed to decrease the acid in your stomach.  Please, follow the dilation diet for today.   Make an appointment to see Dr. Marina Goodell in 6 weeks.   Resume your routine medications today.  If you have any questions, please call us at [phone].  Thank-you for choosing [place] for your medical needs today.

## 2011-07-07 ENCOUNTER — Telehealth: Payer: Self-pay | Admitting: *Deleted

## 2011-07-07 NOTE — Telephone Encounter (Signed)
No ID on answering machine, no message left 

## 2011-08-12 ENCOUNTER — Ambulatory Visit (INDEPENDENT_AMBULATORY_CARE_PROVIDER_SITE_OTHER): Payer: 59 | Admitting: Internal Medicine

## 2011-08-12 ENCOUNTER — Encounter: Payer: Self-pay | Admitting: Internal Medicine

## 2011-08-12 VITALS — BP 144/68 | HR 84 | Ht 63.0 in | Wt 185.6 lb

## 2011-08-12 DIAGNOSIS — K222 Esophageal obstruction: Secondary | ICD-10-CM

## 2011-08-12 DIAGNOSIS — R1310 Dysphagia, unspecified: Secondary | ICD-10-CM

## 2011-08-12 DIAGNOSIS — K219 Gastro-esophageal reflux disease without esophagitis: Secondary | ICD-10-CM

## 2011-08-12 NOTE — Progress Notes (Signed)
HISTORY OF PRESENT ILLNESS:  [name] is a 50 y.o. female with the below list of medical history who presents today for followup. She was seen in 05/20/2011 regarding dysphagia. See that dictation for details. Also problems with cough. She subsequently underwent upper endoscopy 07/04/2011. She was found to have a distal esophageal stricture which was dilated with a 54 [place] dilator. She is continued on Nexium 80 mg at night. She presents now for followup. She does tell me that her cough stopped after discontinuation of losartan. She is having no reflux symptoms. Her dysphagia has resolved.  REVIEW OF SYSTEMS:  All non-GI ROS negative. Past Medical History  Diagnosis Date  . Hyperlipidemia   . Hypertension   . Pre-diabetes   . Obesity (BMI 30-39.9)   . Endometriosis   . GERD (gastroesophageal reflux disease)   . Fibroids     uterine    Past Surgical History  Procedure Date  . Abdominal hysterectomy 2008  . Breast surgery 2007    Removed cyst   . Exploratory laparotomy     Social History CHELLSIE GOMER  reports that she has never smoked. She has never used smokeless tobacco. She reports that she drinks about .6 ounces of alcohol per week. She reports that she does not use illicit drugs.  family history includes Diabetes in her brother and mother; Heart attack in her father; Heart disease in her brother and sister; Hyperlipidemia in her brother; Hypertension in her father; and Stroke in her brother.  There is no history of Colon cancer.  Allergies  Allergen Reactions  . Darvocet (Propoxyphene N-Acetaminophen) Nausea Only       PHYSICAL EXAMINATION: Vital signs: BP 144/68  Pulse 84 General: Well-developed, well-nourished, no acute distress Abdomen: Not reexamined Psychiatric: alert and oriented x3. Cooperative    ASSESSMENT:  #1. GERD complicated by peptic stricture. Asymptomatic post dilation on PPI #2. Cough. Presumably due to losartan #3. Screening  colonoscopy. Index exam November 2006. Normal.   PLAN:  #1. Reflux precautions #2. Change Nexium to 40 mg every morning 30 minutes before her first meal #3. GI followup when necessary. Resume care with primary provider

## 2011-08-12 NOTE — Patient Instructions (Signed)
Follow up as needed

## 2011-11-19 ENCOUNTER — Other Ambulatory Visit (HOSPITAL_COMMUNITY): Payer: Self-pay | Admitting: Obstetrics and Gynecology

## 2011-11-19 DIAGNOSIS — R1032 Left lower quadrant pain: Secondary | ICD-10-CM

## 2011-11-20 ENCOUNTER — Ambulatory Visit (HOSPITAL_COMMUNITY)
Admission: RE | Admit: 2011-11-20 | Discharge: 2011-11-20 | Disposition: A | Discharge status: Home / Self Care | Payer: 59 | Source: Ambulatory Visit | Attending: Obstetrics and Gynecology | Admitting: Obstetrics and Gynecology

## 2011-11-20 DIAGNOSIS — R1032 Left lower quadrant pain: Secondary | ICD-10-CM

## 2011-11-20 DIAGNOSIS — K449 Diaphragmatic hernia without obstruction or gangrene: Secondary | ICD-10-CM | POA: Insufficient documentation

## 2011-11-20 DIAGNOSIS — R1031 Right lower quadrant pain: Secondary | ICD-10-CM | POA: Insufficient documentation

## 2011-11-20 MED ORDER — IOHEXOL 300 MG/ML  SOLN
80.0000 mL | Freq: Once | INTRAMUSCULAR | Status: AC | PRN
  Administered 2011-11-20: 80 mL via INTRAVENOUS

## 2011-12-29 ENCOUNTER — Ambulatory Visit: Payer: 59 | Admitting: Internal Medicine

## 2011-12-31 ENCOUNTER — Ambulatory Visit (INDEPENDENT_AMBULATORY_CARE_PROVIDER_SITE_OTHER): Payer: 59 | Admitting: Internal Medicine

## 2011-12-31 ENCOUNTER — Encounter: Payer: Self-pay | Admitting: Internal Medicine

## 2011-12-31 VITALS — BP 136/78 | HR 88 | Ht 63.0 in | Wt 182.0 lb

## 2011-12-31 DIAGNOSIS — IMO0001 Reserved for inherently not codable concepts without codable children: Secondary | ICD-10-CM

## 2011-12-31 DIAGNOSIS — K219 Gastro-esophageal reflux disease without esophagitis: Secondary | ICD-10-CM

## 2011-12-31 DIAGNOSIS — R933 Abnormal findings on diagnostic imaging of other parts of digestive tract: Secondary | ICD-10-CM

## 2011-12-31 DIAGNOSIS — R1032 Left lower quadrant pain: Secondary | ICD-10-CM

## 2011-12-31 DIAGNOSIS — K222 Esophageal obstruction: Secondary | ICD-10-CM

## 2011-12-31 NOTE — Patient Instructions (Signed)
Please follow up as needed 

## 2011-12-31 NOTE — Progress Notes (Signed)
HISTORY OF PRESENT ILLNESS:  [name] is a 51 y.o. female with the below listed medical history who presents today regarding lower abdominal pain and abnormal abdominal imaging. She is sent by her gynecology team. She has been seen in this office previously for GERD complicated by peptic stricture requiring esophageal dilation. As well, screening colonoscopy (normal) in November 2006. At the time of her last office visit (December 2012) she was doing well. Current history dates back about 5 weeks ago when she developed generalized malaise followed by left lower quadrant/left side discomfort. Actually, pain. The discomfort progress over the next few days to include lower pelvic cramping discomfort. No nausea, vomiting, fever, change in bowel habits, or bleeding. She saw her gynecology team (outside records reviewed). Blood work was unremarkable. Pelvic ultrasound was negative for gynecologic abnormalities. A CT scan of the abdomen and pelvis, with contrast, revealed mild inflammatory changes involving a loop of proximal sigmoid colon said to represent possible mild diverticulitis or colitis. She was treated with ciprofloxacin, which she took for 10 days. Her discomfort has subsequently resolved. I have reviewed the CT scan personally as well as with the radiologist, Dr. Lowella Dandy. There are no diverticula observed. The picture is most consistent with epiploic appendagitis. Her last colonoscopy did not show diverticula.  REVIEW OF SYSTEMS:  All non-GI ROS negative.  Past Medical History  Diagnosis Date  . Hyperlipidemia   . Hypertension   . Pre-diabetes   . Obesity (BMI 30-39.9)   . Endometriosis   . GERD (gastroesophageal reflux disease)   . Fibroids     uterine  . Esophageal stricture   . Hiatal hernia     Past Surgical History  Procedure Date  . Abdominal hysterectomy 2008  . Breast surgery 2007    Removed cyst   . Exploratory laparotomy     Social History LAXMI CHOUNG  reports that  she has never smoked. She has never used smokeless tobacco. She reports that she drinks about .6 ounces of alcohol per week. She reports that she does not use illicit drugs.  family history includes Diabetes in her brother and mother; Heart attack in her father; Heart disease in her brother and sister; Hyperlipidemia in her brother; Hypertension in her father; and Stroke in her brother.  There is no history of Colon cancer.  Allergies  Allergen Reactions  . Darvocet (Propoxacet-N) Nausea Only       PHYSICAL EXAMINATION: Vital signs: BP 136/78  Pulse 88  Ht 5\' 3"  (1.6 m)  Wt 182 lb (82.555 kg)  BMI 32.24 kg/m2 General: Well-developed, well-nourished, no acute distress HEENT: Sclerae are anicteric, conjunctiva pink. Oral mucosa intact Lungs: Clear Heart: Regular Abdomen: soft, nontender, nondistended, no obvious ascites, no peritoneal signs, normal bowel sounds. No organomegaly. Extremities: No edema Psychiatric: alert and oriented x3. Cooperative    ASSESSMENT:  #1. Recent problems with left lower quadrant pain and mild inflammatory changes on CT scan most likely epiploic appendagitis. Resolved #2. GERD complicated by peptic stricture. Currently asymptomatic post dilation on PPI #3. Negative screening colonoscopy 2006   PLAN:  #1. Expectant management #2. Continue PPI. Followup when necessary for recurrent dysphagia #3. Routine followup screening colonoscopy 2016. Interval GI followup as needed

## 2012-05-13 ENCOUNTER — Encounter: Payer: Self-pay | Admitting: Family Medicine

## 2012-06-01 ENCOUNTER — Encounter: Payer: Self-pay | Admitting: Family Medicine

## 2012-06-11 ENCOUNTER — Encounter: Payer: Self-pay | Admitting: Family Medicine

## 2012-06-11 ENCOUNTER — Ambulatory Visit (INDEPENDENT_AMBULATORY_CARE_PROVIDER_SITE_OTHER): Payer: 59 | Admitting: Family Medicine

## 2012-06-11 VITALS — BP 140/78 | HR 104 | Temp 98.6°F | Resp 16 | Ht 63.0 in | Wt 183.6 lb

## 2012-06-11 DIAGNOSIS — E785 Hyperlipidemia, unspecified: Secondary | ICD-10-CM

## 2012-06-11 DIAGNOSIS — I10 Essential (primary) hypertension: Secondary | ICD-10-CM

## 2012-06-11 DIAGNOSIS — Z79899 Other long term (current) drug therapy: Secondary | ICD-10-CM

## 2012-06-11 DIAGNOSIS — R7989 Other specified abnormal findings of blood chemistry: Secondary | ICD-10-CM

## 2012-06-11 LAB — COMPREHENSIVE METABOLIC PANEL
ALT: 27 U/L (ref 0–35)
Albumin: 4.8 g/dL (ref 3.5–5.2)
BUN: 14 mg/dL (ref 6–23)
Chloride: 103 mEq/L (ref 96–112)
Creat: 0.71 mg/dL (ref 0.50–1.10)
Total Bilirubin: 0.4 mg/dL (ref 0.3–1.2)

## 2012-06-11 MED ORDER — IMIPRAMINE HCL 50 MG PO TABS: 50.0000 mg | ORAL_TABLET | Freq: Every day | ORAL | Status: DC

## 2012-06-11 MED ORDER — ESOMEPRAZOLE MAGNESIUM 40 MG PO CPDR: 40.0000 mg | DELAYED_RELEASE_CAPSULE | Freq: Every day | ORAL | Status: DC

## 2012-06-11 MED ORDER — METFORMIN HCL 500 MG PO TABS: 500.0000 mg | ORAL_TABLET | Freq: Two times a day (BID) | ORAL | Status: DC

## 2012-06-11 MED ORDER — ATORVASTATIN CALCIUM 20 MG PO TABS: 20.0000 mg | ORAL_TABLET | Freq: Every day | ORAL | Status: DC

## 2012-06-18 ENCOUNTER — Other Ambulatory Visit: Payer: Self-pay | Admitting: Family Medicine

## 2012-06-18 ENCOUNTER — Encounter: Payer: Self-pay | Admitting: *Deleted

## 2012-06-18 ENCOUNTER — Other Ambulatory Visit (INDEPENDENT_AMBULATORY_CARE_PROVIDER_SITE_OTHER): Payer: 59 | Admitting: *Deleted

## 2012-06-18 DIAGNOSIS — I10 Essential (primary) hypertension: Secondary | ICD-10-CM

## 2012-06-18 DIAGNOSIS — E785 Hyperlipidemia, unspecified: Secondary | ICD-10-CM

## 2012-06-18 MED ORDER — AMLODIPINE BESYLATE 5 MG PO TABS: 5.0000 mg | ORAL_TABLET | Freq: Every day | ORAL | Status: DC

## 2012-06-18 NOTE — Progress Notes (Signed)
Pt here for labs only. 

## 2012-06-19 LAB — LIPID PANEL
Cholesterol: 169 mg/dL (ref 0–200)
LDL Cholesterol: 94 mg/dL (ref 0–99)
Total CHOL/HDL Ratio: 4.8 Ratio
VLDL: 40 mg/dL (ref 0–40)

## 2012-06-22 ENCOUNTER — Other Ambulatory Visit: Payer: Self-pay | Admitting: Family Medicine

## 2012-06-22 MED ORDER — ATORVASTATIN CALCIUM 20 MG PO TABS: 20.0000 mg | ORAL_TABLET | Freq: Every day | ORAL | Status: DC

## 2012-07-05 ENCOUNTER — Ambulatory Visit (INDEPENDENT_AMBULATORY_CARE_PROVIDER_SITE_OTHER): Payer: Self-pay | Admitting: Family Medicine

## 2012-07-05 DIAGNOSIS — R7309 Other abnormal glucose: Secondary | ICD-10-CM

## 2012-07-05 DIAGNOSIS — R7303 Prediabetes: Secondary | ICD-10-CM

## 2012-07-05 NOTE — Progress Notes (Signed)
Patient presents today for 3 month pre-diabetes follow-up as part of the employer-sponsored [employer] program. Medications and glucose readings have been reviewed. I have also discussed with patient lifestyle interventions such as healthy eating choices and physical activity. Details of this visit can be found in [employer] documenting program available through [employer] [facility]). Patient has set a series of personal goals and will follow-up in 3 months for further review of DM.

## 2012-07-10 ENCOUNTER — Emergency Department (HOSPITAL_BASED_OUTPATIENT_CLINIC_OR_DEPARTMENT_OTHER)
Admission: EM | Admit: 2012-07-10 | Discharge: 2012-07-10 | Disposition: A | Discharge status: Home / Self Care | Payer: 59 | Attending: Emergency Medicine | Admitting: Emergency Medicine

## 2012-07-10 ENCOUNTER — Emergency Department (HOSPITAL_BASED_OUTPATIENT_CLINIC_OR_DEPARTMENT_OTHER): Payer: 59

## 2012-07-10 ENCOUNTER — Encounter (HOSPITAL_BASED_OUTPATIENT_CLINIC_OR_DEPARTMENT_OTHER): Payer: Self-pay | Admitting: *Deleted

## 2012-07-10 DIAGNOSIS — Z8719 Personal history of other diseases of the digestive system: Secondary | ICD-10-CM | POA: Insufficient documentation

## 2012-07-10 DIAGNOSIS — I10 Essential (primary) hypertension: Secondary | ICD-10-CM | POA: Insufficient documentation

## 2012-07-10 DIAGNOSIS — Z7982 Long term (current) use of aspirin: Secondary | ICD-10-CM | POA: Insufficient documentation

## 2012-07-10 DIAGNOSIS — Z8742 Personal history of other diseases of the female genital tract: Secondary | ICD-10-CM | POA: Insufficient documentation

## 2012-07-10 DIAGNOSIS — Z79899 Other long term (current) drug therapy: Secondary | ICD-10-CM | POA: Insufficient documentation

## 2012-07-10 DIAGNOSIS — K219 Gastro-esophageal reflux disease without esophagitis: Secondary | ICD-10-CM | POA: Insufficient documentation

## 2012-07-10 DIAGNOSIS — R109 Unspecified abdominal pain: Secondary | ICD-10-CM | POA: Insufficient documentation

## 2012-07-10 DIAGNOSIS — E119 Type 2 diabetes mellitus without complications: Secondary | ICD-10-CM | POA: Insufficient documentation

## 2012-07-10 DIAGNOSIS — E669 Obesity, unspecified: Secondary | ICD-10-CM | POA: Insufficient documentation

## 2012-07-10 DIAGNOSIS — R319 Hematuria, unspecified: Secondary | ICD-10-CM | POA: Insufficient documentation

## 2012-07-10 DIAGNOSIS — E785 Hyperlipidemia, unspecified: Secondary | ICD-10-CM | POA: Insufficient documentation

## 2012-07-10 LAB — URINE MICROSCOPIC-ADD ON

## 2012-07-10 LAB — URINALYSIS, ROUTINE W REFLEX MICROSCOPIC
Bilirubin Urine: NEGATIVE
Nitrite: NEGATIVE
Protein, ur: NEGATIVE mg/dL
Urobilinogen, UA: 0.2 mg/dL (ref 0.0–1.0)

## 2012-07-10 MED ORDER — HYDROCODONE-ACETAMINOPHEN 5-325 MG PO TABS: 2.0000 | ORAL_TABLET | ORAL | Status: DC | PRN

## 2012-07-10 MED ORDER — PHENAZOPYRIDINE HCL 200 MG PO TABS: 200.0000 mg | ORAL_TABLET | Freq: Three times a day (TID) | ORAL | Status: DC

## 2012-07-10 MED ORDER — PHENAZOPYRIDINE HCL 100 MG PO TABS
200.0000 mg | ORAL_TABLET | Freq: Once | ORAL | Status: AC
  Administered 2012-07-10: 200 mg via ORAL
  Filled 2012-07-10: qty 2

## 2012-07-10 NOTE — ED Notes (Addendum)
C/o urgency with urine that started last night. C/o right flank pain that started at 615pm this evening. Denies fevers. States she is only able to "dribble urine" since 6pm today. Has not seen any blood in urine. Denies hx of kidney stone

## 2012-07-10 NOTE — ED Provider Notes (Signed)
History     CSN: [id number]  Arrival date & time 07/10/12  2014   First MD Initiated Contact with Patient 07/10/12 2132      Chief Complaint  Patient presents with  . Urinary Tract Infection    (Consider location/radiation/quality/duration/timing/severity/associated sxs/prior treatment) Patient is a 51 y.o. female presenting with dysuria. The history is provided by the patient. No language interpreter was used.  Dysuria  This is a new problem. The current episode started yesterday. The problem occurs every urination. The problem has been gradually worsening. The quality of the pain is described as burning. The pain is moderate. There has been no fever. Associated symptoms include flank pain. Pertinent negatives include no vomiting, no hematuria and no urgency. She has tried nothing for the symptoms. Her past medical history does not include kidney stones.   Pt reports she began having urgency last pm with decreased urine output.  Pt reports pain in right back tonight.   Past Medical History  Diagnosis Date  . Hyperlipidemia   . Hypertension   . Pre-diabetes   . Obesity (BMI 30-39.9)   . Endometriosis   . GERD (gastroesophageal reflux disease)   . Fibroids     uterine  . Esophageal stricture   . Hiatal hernia     Past Surgical History  Procedure Date  . Abdominal hysterectomy 2008  . Breast surgery 2007    Removed cyst   . Exploratory laparotomy     Family History  Problem Relation Age of Onset  . Diabetes Mother   . Hypertension Father   . Heart attack Father   . Heart disease Sister   . Heart disease Brother   . Diabetes Brother   . Hyperlipidemia Brother   . Stroke Brother   . Colon cancer Neg Hx     History  Substance Use Topics  . Smoking status: Never Smoker   . Smokeless tobacco: Never Used  . Alcohol Use: 0.6 oz/week    1 Glasses of wine per week     1-2 per week    OB History    Grav Para Term Preterm Abortions TAB SAB Ect Mult Living           Review of Systems  Gastrointestinal: Negative for vomiting.  Genitourinary: Positive for dysuria and flank pain. Negative for urgency and hematuria.  All other systems reviewed and are negative.    Allergies  Darvocet  Home Medications   Current Outpatient Rx  Name Route Sig Dispense Refill  . AMLODIPINE BESYLATE 5 MG PO TABS Oral Take 1 tablet (5 mg total) by mouth daily. 90 tablet 3  . ASPIRIN 81 MG PO TABS Oral Take 81 mg by mouth daily.      . ATORVASTATIN CALCIUM 20 MG PO TABS Oral Take 1 tablet (20 mg total) by mouth daily. 90 tablet 2  . CALTRATE 600 PLUS-VIT D PO Oral Take 1 tablet by mouth 2 (two) times daily.      Marland Kitchen ESOMEPRAZOLE MAGNESIUM 40 MG PO CPDR Oral Take 1 capsule (40 mg total) by mouth at bedtime. 30 capsule 11  . IMIPRAMINE HCL 50 MG PO TABS Oral Take 1 tablet (50 mg total) by mouth at bedtime. 30 tablet 11  . METFORMIN HCL 500 MG PO TABS Oral Take 1 tablet (500 mg total) by mouth 2 (two) times daily with a meal. 60 tablet 5    BP 151/91  Temp 98.1 F (36.7 C) (Oral)  Resp 18  Ht 5'  3" (1.6 m)  Wt 180 lb (81.647 kg)  BMI 31.89 kg/m2  SpO2 99%  Physical Exam  Nursing note and vitals reviewed. Constitutional: She is oriented to person, place, and time. She appears well-developed and well-nourished.  HENT:  Head: Normocephalic.  Right Ear: External ear normal.  Left Ear: External ear normal.  Eyes: Conjunctivae normal and EOM are normal. Pupils are equal, round, and reactive to light.  Neck: Normal range of motion. Neck supple.  Cardiovascular: Normal rate and normal heart sounds.   Pulmonary/Chest: Effort normal and breath sounds normal.  Abdominal: Soft. There is tenderness.       Tender right flank,  Abdomen soft,  nontender  Musculoskeletal: Normal range of motion.  Neurological: She is alert and oriented to person, place, and time. She has normal reflexes.  Skin: Skin is warm.    ED Course  Procedures (including critical care  time)  Labs Reviewed  URINALYSIS, ROUTINE W REFLEX MICROSCOPIC - Abnormal; Notable for the following:    Hgb urine dipstick LARGE (*)     All other components within normal limits  URINE MICROSCOPIC-ADD ON - Abnormal; Notable for the following:    Bacteria, UA FEW (*)     All other components within normal limits   No results found.   No diagnosis found.    MDM    Urine large blood 7-10 rbc's in urine.  Ct no stone,  No abnormality.   I will send a urine culture.  Pt given rx for pyridium and hydrocodone for pain.   Pt may have passed a stone.  I advised follow up with primary Md to make sure hematuria clears      Elson Areas, [place] 07/10/12 2322

## 2012-07-11 NOTE — ED Provider Notes (Signed)
Medical screening examination/treatment/procedure(s) were performed by non-physician practitioner and as supervising physician I was immediately available for consultation/collaboration.  John-Adam Keelon Zurn, M.D.     John-Adam Deamonte Sayegh, MD 07/11/12 1016 

## 2012-07-12 ENCOUNTER — Encounter: Payer: Self-pay | Admitting: Family Medicine

## 2012-07-12 ENCOUNTER — Ambulatory Visit (INDEPENDENT_AMBULATORY_CARE_PROVIDER_SITE_OTHER): Payer: 59 | Admitting: Family Medicine

## 2012-07-12 VITALS — BP 159/84 | HR 109 | Temp 97.9°F | Resp 20 | Ht 63.5 in | Wt 180.0 lb

## 2012-07-12 DIAGNOSIS — R319 Hematuria, unspecified: Secondary | ICD-10-CM

## 2012-07-12 DIAGNOSIS — N390 Urinary tract infection, site not specified: Secondary | ICD-10-CM

## 2012-07-12 LAB — POCT UA - MICROSCOPIC ONLY
Casts, Ur, LPF, POC: NEGATIVE
Mucus, UA: NEGATIVE
Yeast, UA: NEGATIVE

## 2012-07-12 LAB — POCT URINALYSIS DIPSTICK
Bilirubin, UA: NEGATIVE
Ketones, UA: NEGATIVE
Nitrite, UA: NEGATIVE
Protein, UA: NEGATIVE
pH, UA: 7

## 2012-07-12 MED ORDER — CIPROFLOXACIN HCL 500 MG PO TABS: 500.0000 mg | ORAL_TABLET | Freq: Two times a day (BID) | ORAL | Status: DC

## 2012-07-12 NOTE — Progress Notes (Signed)
Subjective:    Patient ID: [name], female    DOB: 1961-07-03, 51 y.o.   MRN: [id number]  HPI  Was asymptomatic till Fri night when she had nocturia about 6 times, not feeling great on Sat then Sat night had severe pain in right abd rad to back/flank with urinary hesitancy. Pain was so bad she could hardly sit in the car, was writhing while her husband drove her, and then while getting triaged at the hosp had to urinate really bad and a large volume came out and then pain went away almost totally.  Urine always looked normal.  Now sxs back to normal except for some dysuria.  At there ER there was a large amount of blood in her urine but a renal CT was neg so thought was that she had passed a kidney stone.    Past Medical History  Diagnosis Date  . Hyperlipidemia   . Hypertension   . Pre-diabetes   . Obesity (BMI 30-39.9)   . Endometriosis   . GERD (gastroesophageal reflux disease)   . Fibroids     uterine  . Esophageal stricture   . Hiatal hernia      Review of Systems  Constitutional: Negative for fever, chills, diaphoresis, activity change, appetite change, fatigue and unexpected weight change.  Gastrointestinal: Positive for abdominal pain. Negative for nausea, vomiting, diarrhea, constipation, blood in stool, abdominal distention, anal bleeding and rectal pain.  Genitourinary: Positive for dysuria. Negative for urgency, frequency, hematuria, flank pain, decreased urine volume, vaginal bleeding, vaginal discharge, difficulty urinating, genital sores, vaginal pain, menstrual problem, pelvic pain and dyspareunia.  Musculoskeletal: Negative for gait problem.  Skin: Negative for rash.  Neurological: Negative for weakness and light-headedness.  Hematological: Negative for adenopathy.      BP 159/84  Pulse 109  Temp 97.9 F (36.6 C) (Oral)  Resp 20  Ht 5' 3.5" (1.613 m)  Wt 180 lb (81.647 kg)  BMI 31.39 kg/m2  SpO2 97%  Objective:   Physical Exam  Constitutional: She is  oriented to person, place, and time. She appears well-developed and well-nourished. No distress.  HENT:  Head: Normocephalic and atraumatic.  Cardiovascular: Normal rate, regular rhythm, normal heart sounds and intact distal pulses.   Pulmonary/Chest: Effort normal and breath sounds normal.  Abdominal: Soft. Bowel sounds are normal. She exhibits no distension. There is no hepatosplenomegaly. There is no tenderness. There is no rebound, no guarding and no CVA tenderness.  Neurological: She is alert and oriented to person, place, and time.  Skin: Skin is warm and dry. She is not diaphoretic.  Psychiatric: She has a normal mood and affect. Her behavior is normal.          Results for orders placed in visit on 07/12/12  POCT URINALYSIS DIPSTICK      Component Value Range   Color, UA yellow     Clarity, UA clear     Glucose, UA neg     Bilirubin, UA neg     Ketones, UA neg     Spec Grav, UA 1.020     Blood, UA mod     pH, UA 7.0     Protein, UA neg     Urobilinogen, UA 0.2     Nitrite, UA neg     Leukocytes, UA small (1+)    POCT UA - MICROSCOPIC ONLY      Component Value Range   WBC, Ur, HPF, POC 0-2     RBC, urine, microscopic  2-6     Bacteria, U Microscopic trace     Mucus, UA neg     Epithelial cells, urine per micros neg     Crystals, Ur, HPF, POC neg     Casts, Ur, LPF, POC neg     Yeast, UA neg      Assessment & Plan:   1. Hematuria  POCT urinalysis dipstick, POCT UA - Microscopic Only  2. Urinary tract infection     Still w/ mod amount of blood but now some leuks and urine clx from the hosp grew out some e.coli so will go ahead and cover w/ cipro bid x 3d.  Recheck in 5d and if still having hematuria, will refer to urology at that time. However, suspect that this was all due to a passed kidney stone from the hx.

## 2012-07-12 NOTE — Progress Notes (Signed)
Patient ID: [name], female   DOB: [birth year], 51 y.o.   MRN: [id number] Reviewed.  Agree with management and documentation.

## 2012-07-13 LAB — URINE CULTURE

## 2012-07-14 NOTE — ED Notes (Signed)
+  Urine. Patient treated with Cipro. Sensitive to same. Per protocol MD. °

## 2012-07-17 ENCOUNTER — Encounter: Payer: Self-pay | Admitting: Family Medicine

## 2012-07-17 ENCOUNTER — Ambulatory Visit (INDEPENDENT_AMBULATORY_CARE_PROVIDER_SITE_OTHER): Payer: 59 | Admitting: Family Medicine

## 2012-07-17 VITALS — BP 155/80 | HR 112 | Temp 98.0°F | Resp 16 | Ht 63.5 in | Wt 182.0 lb

## 2012-07-17 DIAGNOSIS — I10 Essential (primary) hypertension: Secondary | ICD-10-CM

## 2012-07-17 DIAGNOSIS — R319 Hematuria, unspecified: Secondary | ICD-10-CM

## 2012-07-17 LAB — POCT UA - MICROSCOPIC ONLY: Bacteria, U Microscopic: NEGATIVE

## 2012-07-17 LAB — POCT URINALYSIS DIPSTICK
Bilirubin, UA: NEGATIVE
Glucose, UA: 100
Leukocytes, UA: NEGATIVE
Nitrite, UA: NEGATIVE
Urobilinogen, UA: 0.2

## 2012-07-17 MED ORDER — AMLODIPINE BESYLATE 10 MG PO TABS: 10.0000 mg | ORAL_TABLET | Freq: Every day | ORAL | Status: DC

## 2012-07-17 NOTE — Progress Notes (Signed)
  Subjective:    Patient ID: [name], female    DOB: 01/23/1961, 51 y.o.   MRN: [id number]  HPI  Is doing well. The pain has resolved. Norm urination.  Not checking BP outside office.  Has not taken her BP med yet today - often forgets on the wkend.  Past Medical History  Diagnosis Date  . Hyperlipidemia   . Hypertension   . Pre-diabetes   . Obesity (BMI 30-39.9)   . Endometriosis   . GERD (gastroesophageal reflux disease)   . Fibroids     uterine  . Esophageal stricture   . Hiatal hernia      Review of Systems  Constitutional: Positive for fatigue. Negative for fever, chills, diaphoresis and appetite change.  Cardiovascular: Negative for chest pain, palpitations and leg swelling.  Gastrointestinal: Negative for abdominal pain.  Genitourinary: Negative for dysuria, urgency, frequency, hematuria, flank pain, decreased urine volume and difficulty urinating.  Neurological: Negative for syncope.  Hematological: Negative for adenopathy. Does not bruise/bleed easily.      BP 155/80  Pulse 112  Temp 98 F (36.7 C) (Oral)  Resp 16  Ht 5' 3.5" (1.613 m)  Wt 182 lb (82.555 kg)  BMI 31.73 kg/m2  SpO2 97% Objective:   Physical Exam  Constitutional: She is oriented to person, place, and time. She appears well-developed and well-nourished. No distress.  HENT:  Head: Normocephalic and atraumatic.  Right Ear: External ear normal.  Eyes: Conjunctivae normal are normal. No scleral icterus.  Cardiovascular: Intact distal pulses.   Pulmonary/Chest: Effort normal.  Neurological: She is alert and oriented to person, place, and time.  Skin: Skin is warm and dry. She is not diaphoretic. No erythema.  Psychiatric: She has a normal mood and affect. Her behavior is normal.   Results for orders placed in visit on 07/17/12  POCT URINALYSIS DIPSTICK      Component Value Range   Color, UA yellow     Clarity, UA clear     Glucose, UA 100     Bilirubin, UA neg     Ketones, UA neg     Spec Grav, UA 1.020     Blood, UA neg     pH, UA 6.5     Protein, UA neg     Urobilinogen, UA 0.2     Nitrite, UA neg     Leukocytes, UA Negative    POCT UA - MICROSCOPIC ONLY      Component Value Range   WBC, Ur, HPF, POC 0-1     RBC, urine, microscopic 0-1     Bacteria, U Microscopic neg     Mucus, UA neg     Epithelial cells, urine per micros 0-1     Crystals, Ur, HPF, POC neg     Casts, Ur, LPF, POC neg     Yeast, UA neg            Assessment & Plan:   1. Hematuria  POCT urinalysis dipstick, POCT UA - Microscopic Only. Hematuria resolved - suspect she did have a kidney stone but reassuring that renal CT shows no others so needs no further trx at this time  2. Hypertension  amLODipine (NORVASC) 10 MG tablet - increase from 5mg  as BP persistently elev in office

## 2012-07-27 NOTE — Progress Notes (Signed)
Subjective:    Patient ID: [name], female    DOB: [birth year], 51 y.o.   MRN: [id number]  HPI  [name] is a delightful 51 yo woman who was a former co-worker of mine at [employer]. She is now working as a Associate Professor at [employer].  She is here today for med refills. Reports that she continues to be frustrated by her weight gain. Has been struggling with diet and exercise or years but don't seem to be making helping.  She is very interested in exploring medication options.  Pt has a support program through work which she is going to get involved with to support her tlc. Prev tried metformin but no longer needing anything for mood and did not see any weight loss with this. Otherwise is doing great w/o complaints.  Past Medical History  Diagnosis Date  . Hyperlipidemia   . Hypertension   . Pre-diabetes   . Obesity (BMI 30-39.9)   . Endometriosis   . GERD (gastroesophageal reflux disease)   . Fibroids     uterine  . Esophageal stricture   . Hiatal hernia    Current Outpatient Prescriptions on File Prior to Visit  Medication Sig Dispense Refill  . aspirin 81 MG tablet Take 81 mg by mouth daily.        . Calcium-Vitamin D (CALTRATE 600 PLUS-VIT D PO) Take 1 tablet by mouth 2 (two) times daily.        Marland Kitchen esomeprazole (NEXIUM) 40 MG capsule Take 1 capsule (40 mg total) by mouth at bedtime.  30 capsule  11  . imipramine (TOFRANIL) 50 MG tablet Take 1 tablet (50 mg total) by mouth at bedtime.  30 tablet  11  . metFORMIN (GLUCOPHAGE) 500 MG tablet Take 1 tablet (500 mg total) by mouth 2 (two) times daily with a meal.  60 tablet  5  . atorvastatin (LIPITOR) 20 MG tablet Take 1 tablet (20 mg total) by mouth daily.  90 tablet  2     Review of Systems  Constitutional: Positive for fatigue and unexpected weight change. Negative for fever, chills, diaphoresis, activity change and appetite change.  Eyes: Negative for visual disturbance.  Respiratory: Negative for cough and shortness of breath.    Cardiovascular: Negative for chest pain, palpitations and leg swelling.  Gastrointestinal: Positive for abdominal pain.  Genitourinary: Negative for decreased urine volume.  Musculoskeletal: Positive for arthralgias.  Neurological: Negative for syncope and headaches.  Hematological: Does not bruise/bleed easily.  Psychiatric/Behavioral: Positive for sleep disturbance. Negative for behavioral problems, confusion and dysphoric mood. The patient is nervous/anxious.       BP 140/78  Pulse 104  Temp 98.6 F (37 C) (Oral)  Resp 16  Ht 5\' 3"  (1.6 m)  Wt 183 lb 9.6 oz (83.28 kg)  BMI 32.52 kg/m2  SpO2 97% Objective:   Physical Exam  Constitutional: She is oriented to person, place, and time. She appears well-developed and well-nourished. No distress.  HENT:  Head: Normocephalic and atraumatic.  Right Ear: External ear normal.  Left Ear: External ear normal.  Eyes: Conjunctivae normal are normal. No scleral icterus.  Neck: Normal range of motion. Neck supple. No thyromegaly present.  Cardiovascular: Normal rate, regular rhythm, normal heart sounds and intact distal pulses.   Pulmonary/Chest: Effort normal and breath sounds normal. No respiratory distress.  Musculoskeletal: She exhibits no edema.  Lymphadenopathy:    She has no cervical adenopathy.  Neurological: She is alert and oriented to person, place, and  time.  Skin: Skin is warm and dry. She is not diaphoretic. No erythema.  Psychiatric: She has a normal mood and affect. Her behavior is normal.          Results for orders placed in visit on 06/11/12  POCT GLYCOSYLATED HEMOGLOBIN (HGB A1C)      Component Value Range   Hemoglobin A1C 6.0    COMPREHENSIVE METABOLIC PANEL      Component Value Range   Sodium 139  135 - 145 mEq/L   Potassium 3.9  3.5 - 5.3 mEq/L   Chloride 103  96 - 112 mEq/L   CO2 27  19 - 32 mEq/L   Glucose, Bld 104 (*) 70 - 99 mg/dL   BUN 14  6 - 23 mg/dL   Creat 1.61  0.96 - 0.45 mg/dL   Total  Bilirubin 0.4  0.3 - 1.2 mg/dL   Alkaline Phosphatase 103  39 - 117 U/L   AST 18  0 - 37 U/L   ALT 27  0 - 35 U/L   Total Protein 8.0  6.0 - 8.3 g/dL   Albumin 4.8  3.5 - 5.2 g/dL   Calcium 9.5  8.4 - 40.9 mg/dL    Assessment & Plan:   1. Encounter for long-term (current) use of other medications  POCT glycosylated hemoglobin (Hb A1C), Comprehensive metabolic panel  2. Other abnormal blood chemistry - glucose intolerance POCT glycosylated hemoglobin (Hb A1C), Comprehensive metabolic panel  3. Hyperlipidemia  Lipid panel.  On crestor 10 - will try switching back to atorvastatin 20 as should have equal efficacy and less expense.  4. Hypertension  BP not at goal. Pt wants to work on tlc but cons starting acei at f/u if cont to be elev.  5. Obesity - try metformin 500 bid insteady of 1000qhs and hoping that this can work with weight loss. If pt can start exercising and see some weightloss, I will be willing to consider a trial of lorcaserin (Belviq) though I am reluctant since I have not used this med before and since it is newer on the market may have some as of now unforseen side effects. 6. GERD - restart ppi

## 2012-08-27 ENCOUNTER — Encounter: Payer: Self-pay | Admitting: *Deleted

## 2012-08-30 ENCOUNTER — Ambulatory Visit: Payer: 59 | Admitting: Internal Medicine

## 2012-09-28 ENCOUNTER — Ambulatory Visit: Payer: 59 | Admitting: Internal Medicine

## 2012-10-29 ENCOUNTER — Ambulatory Visit (INDEPENDENT_AMBULATORY_CARE_PROVIDER_SITE_OTHER): Payer: Self-pay | Admitting: Family Medicine

## 2012-10-29 VITALS — BP 147/81 | HR 110 | Ht 63.0 in | Wt 183.0 lb

## 2012-10-29 DIAGNOSIS — R7303 Prediabetes: Secondary | ICD-10-CM

## 2012-10-29 DIAGNOSIS — R739 Hyperglycemia, unspecified: Secondary | ICD-10-CM

## 2012-10-29 DIAGNOSIS — R7309 Other abnormal glucose: Secondary | ICD-10-CM

## 2012-10-29 LAB — HEMOGLOBIN A1C: Hgb A1c MFr Bld: 6.4 % — AB (ref 4.0–6.0)

## 2012-11-01 DIAGNOSIS — R739 Hyperglycemia, unspecified: Secondary | ICD-10-CM | POA: Insufficient documentation

## 2012-11-01 NOTE — Progress Notes (Signed)
Patient presents today for 3 month pre-diabetes follow-up as part of the employer-sponsored [employer] program. Medications and glucose readings have been reviewed. I have also discussed with patient lifestyle interventions such as healthy eating choices and physical activity. Details of this visit can be found in [facility] documenting program available through [facility]). Patient has set a series of personal goals and will follow-up in 3 months for further review of pre-diabetes.

## 2012-11-05 ENCOUNTER — Ambulatory Visit: Payer: 59 | Admitting: Internal Medicine

## 2012-11-17 ENCOUNTER — Encounter: Payer: Self-pay | Admitting: Internal Medicine

## 2012-11-17 ENCOUNTER — Ambulatory Visit (INDEPENDENT_AMBULATORY_CARE_PROVIDER_SITE_OTHER): Payer: 59 | Admitting: Internal Medicine

## 2012-11-17 VITALS — BP 130/84 | HR 111 | Ht 63.0 in | Wt 181.0 lb

## 2012-11-17 DIAGNOSIS — E119 Type 2 diabetes mellitus without complications: Secondary | ICD-10-CM

## 2012-11-17 DIAGNOSIS — K219 Gastro-esophageal reflux disease without esophagitis: Secondary | ICD-10-CM

## 2012-11-17 DIAGNOSIS — R1310 Dysphagia, unspecified: Secondary | ICD-10-CM

## 2012-11-17 DIAGNOSIS — K222 Esophageal obstruction: Secondary | ICD-10-CM

## 2012-11-17 LAB — HM DIABETES EYE EXAM: HM Diabetic Eye Exam: NORMAL

## 2012-11-17 NOTE — Patient Instructions (Addendum)
You have been scheduled for an endoscopy with propofol. Please follow written instructions given to you at your visit today. If you use inhalers (even only as needed), please bring them with you on the day of your procedure. 

## 2012-11-17 NOTE — Progress Notes (Signed)
HISTORY OF PRESENT ILLNESS:  [name] is a 52 y.o. female with hypertension, hyperlipidemia, diabetes, and GERD complicated by peptic stricture requiring prior esophageal dilation. She presents today regarding worsening reflux symptoms and recurrent dysphagia. The patient was last seen in April of 2013 regarding abdominal pain and an abnormal CT scan. She was felt to have epiploic appendagitis which had resolved. She did well since that time until about 2 months ago when she developed significant problems with regurgitation. She increased her Nexium to twice daily. Those symptoms improved. She returned Nexium to once daily dosage. About 4-5 weeks ago developed recurrent intermittent solid food dysphagia. Not as bad as previous when she underwent upper endoscopy with [address] dilation (October 2012). Her GI review of systems is otherwise negative.  REVIEW OF SYSTEMS:  All non-GI ROS negative after complete review   Past Medical History  Diagnosis Date  . Hyperlipidemia   . Hypertension   . Pre-diabetes   . Obesity (BMI 30-39.9)   . Endometriosis   . GERD (gastroesophageal reflux disease)   . Fibroids     uterine  . Hiatal hernia   . Peptic stricture of esophagus   . Uterine fibroid   . Kidney stone     Past Surgical History  Procedure Laterality Date  . Abdominal hysterectomy  2008  . Breast cyst excision  2007  . Exploratory laparotomy      Social History [name]  reports that she has never smoked. She has never used smokeless tobacco. She reports that she drinks about 0.6 ounces of alcohol per week. She reports that she does not use illicit drugs.  family history includes Diabetes in her brother and mother; Heart attack in her father; Heart disease in her brother and sister; Hyperlipidemia in her brother; Hypertension in her father; and Stroke in her brother.  There is no history of Colon cancer.  Allergies  Allergen Reactions  . Darvocet  (Propoxyphene-Acetaminophen) Nausea Only       PHYSICAL EXAMINATION: Vital signs: BP 130/84  Pulse 111  Ht 5\' 3"  (1.6 m)  Wt 181 lb (82.101 kg)  BMI 32.07 kg/m2 General: Well-developed, well-nourished, no acute distress HEENT: Sclerae are anicteric, conjunctiva pink. Oral mucosa intact Lungs: Clear Heart: Regular Abdomen: soft, nontender, nondistended, no obvious ascites, no peritoneal signs, normal bowel sounds. No organomegaly. Extremities: No edema Psychiatric: alert and oriented x3. Cooperative     ASSESSMENT:  #1. GERD. Recent exacerbation as manifested by regurgitation #2. Recurrent dysphagia likely secondary to known peptic stricture #3. Negative screening colonoscopy 2006 #4. Multiple medical problems, stable   PLAN:  #1. Continue Nexium 40 mg daily #2. Reflux precautions #3. Upper endoscopy with esophageal dilation.The nature of the procedure, as well as the risks, benefits, and alternatives were carefully and thoroughly reviewed with the patient. Ample time for discussion and questions allowed. The patient understood, was satisfied, and agreed to proceed. #4. Hold diabetic medications the morning of the examination to avoid and wanted hypoglycemia #5. Continue with plans for routine followup screening colonoscopy 2016

## 2012-11-27 ENCOUNTER — Encounter: Payer: Self-pay | Admitting: *Deleted

## 2012-11-29 NOTE — Progress Notes (Signed)
Patient ID: [name], female   DOB: 1960-09-25, 52 y.o.   MRN: [id number] ATTENDING PHYSICIAN NOTE: I have reviewed the chart and agree with the plan as detailed above. Denny Levy MD Pager [phone]

## 2012-11-30 ENCOUNTER — Encounter: Payer: Self-pay | Admitting: *Deleted

## 2012-12-09 ENCOUNTER — Ambulatory Visit: Payer: 59 | Admitting: Internal Medicine

## 2012-12-13 ENCOUNTER — Other Ambulatory Visit: Payer: Self-pay | Admitting: Family Medicine

## 2013-01-03 ENCOUNTER — Encounter: Payer: 59 | Admitting: Internal Medicine

## 2013-01-07 ENCOUNTER — Encounter: Payer: 59 | Admitting: Internal Medicine

## 2013-01-11 ENCOUNTER — Other Ambulatory Visit: Payer: Self-pay | Admitting: Physician Assistant

## 2013-01-20 ENCOUNTER — Other Ambulatory Visit: Payer: Self-pay | Admitting: Internal Medicine

## 2013-01-20 ENCOUNTER — Encounter: Payer: Self-pay | Admitting: Internal Medicine

## 2013-01-20 ENCOUNTER — Ambulatory Visit (AMBULATORY_SURGERY_CENTER): Payer: 59 | Admitting: Internal Medicine

## 2013-01-20 VITALS — BP 121/64 | HR 92 | Temp 97.9°F | Resp 17 | Ht 63.0 in | Wt 181.0 lb

## 2013-01-20 DIAGNOSIS — K219 Gastro-esophageal reflux disease without esophagitis: Secondary | ICD-10-CM

## 2013-01-20 DIAGNOSIS — R1310 Dysphagia, unspecified: Secondary | ICD-10-CM

## 2013-01-20 DIAGNOSIS — K222 Esophageal obstruction: Secondary | ICD-10-CM

## 2013-01-20 LAB — GLUCOSE, CAPILLARY
Glucose-Capillary: 121 mg/dL — ABNORMAL HIGH (ref 70–99)
Glucose-Capillary: 109 mg/dL — ABNORMAL HIGH (ref 70–99)

## 2013-01-20 MED ORDER — SODIUM CHLORIDE 0.9 % IV SOLN: 500.0000 mL | INTRAVENOUS | Status: DC

## 2013-01-20 NOTE — Progress Notes (Signed)
Called to room to assist during endoscopic procedure.  Patient ID and intended procedure confirmed with present staff. Received instructions for my participation in the procedure from the performing physician.  

## 2013-01-20 NOTE — Op Note (Signed)
[facility] [address] [place], [address]   ENDOSCOPY PROCEDURE REPORT  PATIENT: [name], [name]  MR#: [id number] BIRTHDATE: [birth year] , 52  yrs. old GENDER: Female ENDOSCOPIST: Roxy Cedar, MD REFERRED BY:  .  Self / Office PROCEDURE DATE:  01/20/2013 PROCEDURE:  Balloon dilation of esophagus - 18,19,79mm ASA CLASS:     Class II INDICATIONS:  Dysphagia. MEDICATIONS: MAC sedation, administered by CRNA and propofol (Diprivan) 300mg  IV TOPICAL ANESTHETIC: Cetacaine Spray  DESCRIPTION OF PROCEDURE: After the risks benefits and alternatives of the procedure were thoroughly explained, informed consent was obtained.  The LB [id number]-[id number] endoscope was introduced through the mouth and advanced to the second portion of the duodenum. Without limitations.  The instrument was slowly withdrawn as the mucosa was fully examined.      EXAM: Shelf-like 14mm stricture at GEJ (35cm).  Stomach with benign fundic gland type polyps, but otherwise normal.  Normal duodenum. Retroflexed views revealed a hiatal hernia.  THERAPY: TTS sequential balloon to dilate stricture - 18-19-20mm. Heme present. Tolerated well. The scope was then withdrawn from the patient and the procedure completed.  COMPLICATIONS: There were no complications. ENDOSCOPIC IMPRESSION: 1. Esophageal stricture s/p dilation to 20mm 2. GERD  RECOMMENDATIONS: 1.  Clear liquids until 12 noon, then soft foods rest of day. Resume prior diet tomorrow. 2.  Continue Nexium  REPEAT EXAM:  eSigned:  Roxy Cedar, MD 01/20/2013 10:23 AM   XB:JYNW, Carley Hammed MD and The Patient

## 2013-01-20 NOTE — Progress Notes (Signed)
Patient did not experience any of the following events: a burn prior to discharge; a fall within the facility; wrong site/side/patient/procedure/implant event; or a hospital transfer or hospital admission upon discharge from the facility. (G8907) Patient did not have preoperative order for IV antibiotic SSI prophylaxis. (G8918)  

## 2013-01-20 NOTE — Patient Instructions (Addendum)
YOU HAD AN ENDOSCOPIC PROCEDURE TODAY AT THE [facility]: Refer to the procedure report that was given to you for any specific questions about what was found during the examination.  If the procedure report does not answer your questions, please call your gastroenterologist to clarify.  If you requested that your care partner not be given the details of your procedure findings, then the procedure report has been included in a sealed envelope for you to review at your convenience later.  YOU SHOULD EXPECT: Some feelings of bloating in the abdomen. Passage of more gas than usual.  Walking can help get rid of the air that was put into your GI tract during the procedure and reduce the bloating. If you had a lower endoscopy (such as a colonoscopy or flexible sigmoidoscopy) you may notice spotting of blood in your stool or on the toilet paper. If you underwent a bowel prep for your procedure, then you may not have a normal bowel movement for a few days.  DIET: see Dilation diet-  Drink plenty of fluids but you should avoid alcoholic beverages for 24 hours.  ACTIVITY: Your care partner should take you home directly after the procedure.  You should plan to take it easy, moving slowly for the rest of the day.  You can resume normal activity the day after the procedure however you should NOT DRIVE or use heavy machinery for 24 hours (because of the sedation medicines used during the test).    SYMPTOMS TO REPORT IMMEDIATELY: A gastroenterologist can be reached at any hour.  During normal business hours, 8:30 AM to 5:00 PM Monday through Friday, call [phone].  After hours and on weekends, please call the GI answering service at [phone] who will take a message and have the physician on call contact you.   Following upper endoscopy (EGD)  Vomiting of blood or coffee ground material  New chest pain or pain under the shoulder blades  Painful or persistently difficult swallowing  New  shortness of breath  Fever of 100F or higher  Black, tarry-looking stools  FOLLOW UP: If any biopsies were taken you will be contacted by phone or by letter within the next 1-3 weeks.  Call your gastroenterologist if you have not heard about the biopsies in 3 weeks.  Our staff will call the home number listed on your records the next business day following your procedure to check on you and address any questions or concerns that you may have at that time regarding the information given to you following your procedure. This is a courtesy call and so if there is no answer at the home number and we have not heard from you through the emergency physician on call, we will assume that you have returned to your regular daily activities without incident.  SIGNATURES/CONFIDENTIALITY: You and/or your care partner have signed paperwork which will be entered into your electronic medical record.  These signatures attest to the fact that that the information above on your After Visit Summary has been reviewed and is understood.  Full responsibility of the confidentiality of this discharge information lies with you and/or your care-partner.  Stricture, GERD, Dilation Diet-handouts given  Clear liquids til 12 noon, then soft diet rest of day, resume prior diet tomorrow  Continue nexium

## 2013-01-21 ENCOUNTER — Telehealth: Payer: Self-pay | Admitting: *Deleted

## 2013-01-21 NOTE — Telephone Encounter (Signed)
  Follow up Call-  Call back number 01/20/2013 07/04/2011  Post procedure Call Back phone  # [phone]  Permission to leave phone message Yes -     Patient questions:  Do you have a fever, pain , or abdominal swelling? no Pain Score  0 *  Have you tolerated food without any problems? yes  Have you been able to return to your normal activities? yes  Do you have any questions about your discharge instructions: Diet   no Medications  no Follow up visit  no  Do you have questions or concerns about your Care? no  Actions: * If pain score is 4 or above: No action needed, pain <4.

## 2013-02-11 ENCOUNTER — Other Ambulatory Visit: Payer: Self-pay | Admitting: Family Medicine

## 2013-02-18 LAB — HEMOGLOBIN A1C: Hgb A1c MFr Bld: 6.2 % — AB (ref 4.0–6.0)

## 2013-02-21 ENCOUNTER — Ambulatory Visit (INDEPENDENT_AMBULATORY_CARE_PROVIDER_SITE_OTHER): Payer: Self-pay | Admitting: Family Medicine

## 2013-02-21 VITALS — BP 138/82 | HR 108 | Ht 63.0 in | Wt 183.4 lb

## 2013-02-21 DIAGNOSIS — R7303 Prediabetes: Secondary | ICD-10-CM

## 2013-02-21 DIAGNOSIS — R7309 Other abnormal glucose: Secondary | ICD-10-CM

## 2013-02-21 NOTE — Progress Notes (Signed)
Subjective:  Patient presents today for 3 month diabetes follow-up as part of the employer-sponsored [employer] program. Current diabetes regimen includes metformin. Patient also continues on daily ASA and statin. Most recent MD follow-up was in October 2013. No med changes or major health changes at this time.   Recently hurt foot, not broken but had significant inflammation. This happened 4/29 and hasn't been able to walk much since then.    Disease Assessments:  Diabetes:  Type of Diabetes: Type 2; takes medications as prescribed; takes an aspirin a day; Sees Diabetes provider 2 times per year; uses glucometer; MD managing Diabetes Dr. Clelia Croft; checks blood glucose 1-2 times a week; checks feet weekly;  Other Diabetes History: She is still taking metformin 500 mg by mouth twice daily. Patient did not bring her meter to her appointment. She states that she is occasionally checking her blood sugar and it's running 125-130 in the mornings Social History:  Caffeine use: colaPepsi 16 oz daily; Denies alcohol use; Social work consult was not done; Denies drug use; Medication adherence adherent; Patient knows the purpose/use of medications; Pharmacist consult was done; Pharmacy assist consult was not needed; Patient cannot afford medications;   Diet adherence 50-75% of the time; 120 minutes of exercise per week; Exercise adherence Never.  Occupation: Associate Professor- [employer]   Physical Activity- since she hurt her foot, she has been unable to walk as much now  Nutrition- Has been eating more fruits and vegetables, drinking more milk (2%, 2 glasses/day). She continues snacking on sweet snacks 2-3 times a week. She has not reduced Pepsi down from 16 oz/day  B- grits or oatmeal + couple pieces of toast  L- Bring leftovers from home or sandwich  snack- fruit - grapes or apple, banana  D- meat 2-3x/week, salad, vegetables.   Drinks - water or [employer] mostly, plus one 16 oz Pepsi        Testing:  Blood Sugar Tests:  Hemoglobin A1c: 6.2    Assessment/Plan: 52 year old female with pre-diabetes. Glycemic control has improved since last visit and A1C has decreased to 6.2% (from 6.4%). She has not been exercsing for the last month and a half due to a foot inujury but she wants to get back into walking again.  Significant roadblocks for patient are eating habits. She drinks regular Pepsi, ~ 16 ounces each day. She has tried diet sodas before and does not like drinking them. After her last visit, she tried to cut back to 12 oz Pepsi daily but she said with the smaller bottle, she just opened another one so it didn't help. She continues to want to cut this back.   Will fax A1C results to Dr. Clelia Croft. Follow up with patient in 3 months..    Goals for Next Visit-  1. Start walking 2-3x/week again. Start slowly so you don't hurt over do it and hurt your foot again. 2. Cut back your Pepsi to half of a 16 oz drink.   Follow up in 3 months Next Appointment: September 12 at 3:30 pm

## 2013-03-13 ENCOUNTER — Emergency Department (HOSPITAL_COMMUNITY): Payer: No Typology Code available for payment source

## 2013-03-13 ENCOUNTER — Encounter (HOSPITAL_COMMUNITY): Payer: Self-pay | Admitting: Radiology

## 2013-03-13 ENCOUNTER — Other Ambulatory Visit: Payer: Self-pay | Admitting: Emergency Medicine

## 2013-03-13 ENCOUNTER — Emergency Department (HOSPITAL_COMMUNITY)
Admission: EM | Admit: 2013-03-13 | Discharge: 2013-03-13 | Disposition: A | Discharge status: Home / Self Care | Payer: No Typology Code available for payment source | Attending: Emergency Medicine | Admitting: Emergency Medicine

## 2013-03-13 DIAGNOSIS — Z8742 Personal history of other diseases of the female genital tract: Secondary | ICD-10-CM | POA: Insufficient documentation

## 2013-03-13 DIAGNOSIS — Z8719 Personal history of other diseases of the digestive system: Secondary | ICD-10-CM | POA: Insufficient documentation

## 2013-03-13 DIAGNOSIS — Y92410 Unspecified street and highway as the place of occurrence of the external cause: Secondary | ICD-10-CM | POA: Insufficient documentation

## 2013-03-13 DIAGNOSIS — S92002A Unspecified fracture of left calcaneus, initial encounter for closed fracture: Secondary | ICD-10-CM

## 2013-03-13 DIAGNOSIS — E785 Hyperlipidemia, unspecified: Secondary | ICD-10-CM | POA: Insufficient documentation

## 2013-03-13 DIAGNOSIS — Z7982 Long term (current) use of aspirin: Secondary | ICD-10-CM | POA: Insufficient documentation

## 2013-03-13 DIAGNOSIS — Z87442 Personal history of urinary calculi: Secondary | ICD-10-CM | POA: Insufficient documentation

## 2013-03-13 DIAGNOSIS — S6990XA Unspecified injury of unspecified wrist, hand and finger(s), initial encounter: Secondary | ICD-10-CM | POA: Insufficient documentation

## 2013-03-13 DIAGNOSIS — Z90710 Acquired absence of both cervix and uterus: Secondary | ICD-10-CM | POA: Insufficient documentation

## 2013-03-13 DIAGNOSIS — I10 Essential (primary) hypertension: Secondary | ICD-10-CM | POA: Insufficient documentation

## 2013-03-13 DIAGNOSIS — S0993XA Unspecified injury of face, initial encounter: Secondary | ICD-10-CM | POA: Insufficient documentation

## 2013-03-13 DIAGNOSIS — E669 Obesity, unspecified: Secondary | ICD-10-CM | POA: Insufficient documentation

## 2013-03-13 DIAGNOSIS — R1032 Left lower quadrant pain: Secondary | ICD-10-CM | POA: Insufficient documentation

## 2013-03-13 DIAGNOSIS — Z79899 Other long term (current) drug therapy: Secondary | ICD-10-CM | POA: Insufficient documentation

## 2013-03-13 DIAGNOSIS — S92009A Unspecified fracture of unspecified calcaneus, initial encounter for closed fracture: Secondary | ICD-10-CM | POA: Insufficient documentation

## 2013-03-13 DIAGNOSIS — K219 Gastro-esophageal reflux disease without esophagitis: Secondary | ICD-10-CM | POA: Insufficient documentation

## 2013-03-13 DIAGNOSIS — S3981XA Other specified injuries of abdomen, initial encounter: Secondary | ICD-10-CM | POA: Insufficient documentation

## 2013-03-13 DIAGNOSIS — S59909A Unspecified injury of unspecified elbow, initial encounter: Secondary | ICD-10-CM | POA: Insufficient documentation

## 2013-03-13 DIAGNOSIS — Y9389 Activity, other specified: Secondary | ICD-10-CM | POA: Insufficient documentation

## 2013-03-13 LAB — URINALYSIS, ROUTINE W REFLEX MICROSCOPIC
Glucose, UA: NEGATIVE mg/dL
Ketones, ur: 15 mg/dL — AB
Nitrite: NEGATIVE
Protein, ur: NEGATIVE mg/dL
Urobilinogen, UA: 0.2 mg/dL (ref 0.0–1.0)

## 2013-03-13 LAB — URINE MICROSCOPIC-ADD ON

## 2013-03-13 MED ORDER — OXYCODONE-ACETAMINOPHEN 5-325 MG PO TABS: 1.0000 | ORAL_TABLET | Freq: Four times a day (QID) | ORAL | Status: DC | PRN

## 2013-03-13 MED ORDER — IOHEXOL 300 MG/ML  SOLN
100.0000 mL | Freq: Once | INTRAMUSCULAR | Status: AC | PRN
  Administered 2013-03-13: 100 mL via INTRAVENOUS

## 2013-03-13 MED ORDER — KETOROLAC TROMETHAMINE 30 MG/ML IJ SOLN
30.0000 mg | Freq: Once | INTRAMUSCULAR | Status: AC
  Administered 2013-03-13: 30 mg via INTRAVENOUS
  Filled 2013-03-13: qty 1

## 2013-03-13 NOTE — ED Notes (Signed)
EDPA at [facility], c-collar removed by PA, pt reports thirst and hunger, food & drink given by PA, pt also reports L ankle pain, CMS intact, (denies: other sx), pt alert, NAD, calm, interactive, skin W&D, resps e/u, speaking in clear complete sentences, pharm tech at [facility], family at [facility] x2, ortho tech paged for splint and crutches, page returned pending arrival,

## 2013-03-13 NOTE — ED Notes (Addendum)
No deformity noted to left wrist or left ankle. Pulses good. Able to move all extremities. GCS 15.
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
## 2013-03-13 NOTE — ED Provider Notes (Signed)
Medical screening examination/treatment/procedure(s) were performed by non-physician practitioner and as supervising physician I was immediately available for consultation/collaboration.   Everette Dimauro R. Francine Hannan, MD 03/13/13 2352 

## 2013-03-13 NOTE — Progress Notes (Signed)
Orthopedic Tech Progress Note Patient Details:  [name] [birth year] [id number]  Ortho Devices Type of Ortho Device: Ace wrap;Post (short leg) splint;Crutches Ortho Device/Splint Location: left leg Ortho Device/Splint Interventions: Application   [name] 03/13/2013, 11:19 PM

## 2013-03-13 NOTE — ED Notes (Signed)
Pt not in room, pt in radiology.  

## 2013-03-13 NOTE — ED Notes (Signed)
orthotech at [facility] splinting leg, pt eating crackers.

## 2013-03-15 LAB — URINE CULTURE: Colony Count: 35000

## 2013-03-25 ENCOUNTER — Ambulatory Visit (INDEPENDENT_AMBULATORY_CARE_PROVIDER_SITE_OTHER): Payer: 59 | Admitting: Family Medicine

## 2013-03-25 ENCOUNTER — Encounter: Payer: Self-pay | Admitting: Family Medicine

## 2013-03-25 ENCOUNTER — Ambulatory Visit: Payer: 59 | Admitting: Family Medicine

## 2013-03-25 VITALS — BP 154/83 | HR 110 | Temp 98.8°F | Resp 16 | Ht 63.0 in | Wt 178.0 lb

## 2013-03-25 DIAGNOSIS — E785 Hyperlipidemia, unspecified: Secondary | ICD-10-CM | POA: Insufficient documentation

## 2013-03-25 DIAGNOSIS — R739 Hyperglycemia, unspecified: Secondary | ICD-10-CM

## 2013-03-25 DIAGNOSIS — I10 Essential (primary) hypertension: Secondary | ICD-10-CM | POA: Insufficient documentation

## 2013-03-25 DIAGNOSIS — Z79899 Other long term (current) drug therapy: Secondary | ICD-10-CM

## 2013-03-25 DIAGNOSIS — R7309 Other abnormal glucose: Secondary | ICD-10-CM

## 2013-03-25 MED ORDER — LISINOPRIL 10 MG PO TABS: 10.0000 mg | ORAL_TABLET | Freq: Every day | ORAL | Status: DC

## 2013-03-25 MED ORDER — METFORMIN HCL 1000 MG PO TABS: 500.0000 mg | ORAL_TABLET | Freq: Two times a day (BID) | ORAL | Status: DC

## 2013-03-25 MED ORDER — IMIPRAMINE HCL 50 MG PO TABS: 50.0000 mg | ORAL_TABLET | Freq: Every day | ORAL | Status: DC

## 2013-03-25 MED ORDER — AMLODIPINE BESYLATE 10 MG PO TABS: 10.0000 mg | ORAL_TABLET | Freq: Every day | ORAL | Status: DC

## 2013-03-25 MED ORDER — ESOMEPRAZOLE MAGNESIUM 40 MG PO CPDR: 40.0000 mg | DELAYED_RELEASE_CAPSULE | Freq: Every day | ORAL | Status: DC

## 2013-03-25 MED ORDER — ATORVASTATIN CALCIUM 20 MG PO TABS: 20.0000 mg | ORAL_TABLET | Freq: Every day | ORAL | Status: DC
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
## 2013-03-30 ENCOUNTER — Other Ambulatory Visit: Payer: Self-pay

## 2013-03-30 MED ORDER — LANCETS MISC: Status: AC

## 2013-03-30 MED ORDER — GLUCOSE BLOOD VI STRP: ORAL_STRIP | Status: AC

## 2013-04-07 ENCOUNTER — Encounter: Payer: Self-pay | Admitting: *Deleted

## 2013-04-08 ENCOUNTER — Ambulatory Visit: Payer: 59 | Attending: Orthopedic Surgery

## 2013-04-08 DIAGNOSIS — IMO0001 Reserved for inherently not codable concepts without codable children: Secondary | ICD-10-CM | POA: Insufficient documentation

## 2013-04-08 DIAGNOSIS — M25676 Stiffness of unspecified foot, not elsewhere classified: Secondary | ICD-10-CM | POA: Insufficient documentation

## 2013-04-08 DIAGNOSIS — R262 Difficulty in walking, not elsewhere classified: Secondary | ICD-10-CM | POA: Insufficient documentation

## 2013-04-08 DIAGNOSIS — M25673 Stiffness of unspecified ankle, not elsewhere classified: Secondary | ICD-10-CM | POA: Insufficient documentation

## 2013-04-11 ENCOUNTER — Ambulatory Visit: Payer: 59 | Admitting: Physical Therapy

## 2013-04-11 ENCOUNTER — Telehealth: Payer: Self-pay

## 2013-04-11 NOTE — Telephone Encounter (Signed)
PT STATES THE LISINOPRIL IS STILL MAKING HER COUGH THE SAME. WAS TOLD BY DR Clelia Croft TO LET HER KNOW PLEASE CALL [phone]

## 2013-04-14 ENCOUNTER — Other Ambulatory Visit: Payer: Self-pay | Admitting: Family Medicine

## 2013-04-14 NOTE — Telephone Encounter (Signed)
Patient advised.

## 2013-04-14 NOTE — Telephone Encounter (Signed)
Noted, placed on her allergy/intolerance list. She can stop it and I will take it off the med list.

## 2013-04-19 ENCOUNTER — Ambulatory Visit: Payer: 59 | Admitting: Physical Therapy

## 2013-04-22 ENCOUNTER — Ambulatory Visit: Payer: 59 | Admitting: Physical Therapy

## 2013-04-22 ENCOUNTER — Ambulatory Visit: Payer: 59 | Admitting: Family Medicine

## 2013-04-25 ENCOUNTER — Ambulatory Visit: Payer: 59 | Admitting: Physical Therapy

## 2013-04-28 ENCOUNTER — Ambulatory Visit: Payer: 59 | Admitting: Physical Therapy

## 2013-05-04 ENCOUNTER — Ambulatory Visit: Payer: 59 | Admitting: Physical Therapy

## 2013-05-06 ENCOUNTER — Ambulatory Visit: Payer: 59 | Admitting: Physical Therapy

## 2013-05-10 ENCOUNTER — Other Ambulatory Visit (HOSPITAL_COMMUNITY): Payer: Self-pay | Admitting: Orthopedic Surgery

## 2013-05-10 DIAGNOSIS — M25572 Pain in left ankle and joints of left foot: Secondary | ICD-10-CM

## 2013-05-11 ENCOUNTER — Ambulatory Visit: Payer: 59 | Attending: Orthopedic Surgery | Admitting: Physical Therapy

## 2013-05-11 DIAGNOSIS — M25676 Stiffness of unspecified foot, not elsewhere classified: Secondary | ICD-10-CM | POA: Insufficient documentation

## 2013-05-11 DIAGNOSIS — R262 Difficulty in walking, not elsewhere classified: Secondary | ICD-10-CM | POA: Insufficient documentation

## 2013-05-11 DIAGNOSIS — IMO0001 Reserved for inherently not codable concepts without codable children: Secondary | ICD-10-CM | POA: Insufficient documentation

## 2013-05-11 DIAGNOSIS — M25673 Stiffness of unspecified ankle, not elsewhere classified: Secondary | ICD-10-CM | POA: Insufficient documentation

## 2013-05-13 ENCOUNTER — Ambulatory Visit: Payer: 59 | Admitting: Physical Therapy

## 2013-05-16 ENCOUNTER — Ambulatory Visit (HOSPITAL_COMMUNITY)
Admission: RE | Admit: 2013-05-16 | Discharge: 2013-05-16 | Disposition: A | Discharge status: Home / Self Care | Payer: 59 | Source: Ambulatory Visit | Attending: Orthopedic Surgery | Admitting: Orthopedic Surgery

## 2013-05-16 ENCOUNTER — Ambulatory Visit: Payer: 59 | Admitting: Physical Therapy

## 2013-05-16 DIAGNOSIS — IMO0002 Reserved for concepts with insufficient information to code with codable children: Secondary | ICD-10-CM | POA: Insufficient documentation

## 2013-05-16 DIAGNOSIS — S92213A Displaced fracture of cuboid bone of unspecified foot, initial encounter for closed fracture: Secondary | ICD-10-CM | POA: Insufficient documentation

## 2013-05-16 DIAGNOSIS — M898X9 Other specified disorders of bone, unspecified site: Secondary | ICD-10-CM | POA: Insufficient documentation

## 2013-05-16 DIAGNOSIS — M6580 Other synovitis and tenosynovitis, unspecified site: Secondary | ICD-10-CM | POA: Insufficient documentation

## 2013-05-16 DIAGNOSIS — M25572 Pain in left ankle and joints of left foot: Secondary | ICD-10-CM

## 2013-05-16 DIAGNOSIS — M7750 Other enthesopathy of unspecified foot: Secondary | ICD-10-CM | POA: Insufficient documentation

## 2013-05-18 ENCOUNTER — Ambulatory Visit: Payer: 59 | Admitting: Physical Therapy

## 2013-05-23 ENCOUNTER — Ambulatory Visit: Payer: 59 | Admitting: Physical Therapy

## 2013-05-25 ENCOUNTER — Encounter: Payer: 59 | Admitting: Physical Therapy

## 2013-05-30 ENCOUNTER — Ambulatory Visit: Payer: 59 | Admitting: Physical Therapy

## 2013-05-30 NOTE — Progress Notes (Signed)
Patient ID: [name], female   DOB: Mar 25, 1961, 52 y.o.   MRN: [id number] ATTENDING PHYSICIAN NOTE: I have reviewed the chart and agree with the plan as detailed above. Denny Levy MD Pager [phone]

## 2013-06-01 ENCOUNTER — Ambulatory Visit: Payer: 59 | Admitting: Physical Therapy

## 2013-06-06 ENCOUNTER — Ambulatory Visit: Payer: 59 | Admitting: Physical Therapy

## 2013-06-08 ENCOUNTER — Ambulatory Visit: Payer: 59 | Attending: Orthopedic Surgery | Admitting: Physical Therapy

## 2013-06-08 DIAGNOSIS — R262 Difficulty in walking, not elsewhere classified: Secondary | ICD-10-CM | POA: Insufficient documentation

## 2013-06-08 DIAGNOSIS — IMO0001 Reserved for inherently not codable concepts without codable children: Secondary | ICD-10-CM | POA: Insufficient documentation

## 2013-06-08 DIAGNOSIS — M25673 Stiffness of unspecified ankle, not elsewhere classified: Secondary | ICD-10-CM | POA: Insufficient documentation

## 2013-06-08 DIAGNOSIS — M25676 Stiffness of unspecified foot, not elsewhere classified: Secondary | ICD-10-CM | POA: Insufficient documentation

## 2013-06-13 ENCOUNTER — Ambulatory Visit: Payer: 59 | Admitting: Physical Therapy

## 2013-06-15 ENCOUNTER — Ambulatory Visit: Payer: 59 | Admitting: Physical Therapy

## 2013-06-20 ENCOUNTER — Ambulatory Visit: Payer: 59 | Admitting: Physical Therapy

## 2013-06-22 ENCOUNTER — Ambulatory Visit: Payer: 59 | Admitting: Physical Therapy

## 2013-06-28 ENCOUNTER — Ambulatory Visit: Payer: 59 | Admitting: Physical Therapy

## 2013-06-30 ENCOUNTER — Ambulatory Visit: Payer: 59 | Admitting: Physical Therapy

## 2013-07-05 ENCOUNTER — Ambulatory Visit: Payer: 59 | Admitting: Physical Therapy

## 2013-07-07 ENCOUNTER — Ambulatory Visit: Payer: 59 | Admitting: Physical Therapy

## 2013-07-12 ENCOUNTER — Ambulatory Visit: Payer: 59 | Attending: Orthopedic Surgery | Admitting: Physical Therapy

## 2013-07-12 DIAGNOSIS — M25673 Stiffness of unspecified ankle, not elsewhere classified: Secondary | ICD-10-CM | POA: Insufficient documentation

## 2013-07-12 DIAGNOSIS — M25676 Stiffness of unspecified foot, not elsewhere classified: Secondary | ICD-10-CM | POA: Insufficient documentation

## 2013-07-12 DIAGNOSIS — R262 Difficulty in walking, not elsewhere classified: Secondary | ICD-10-CM | POA: Insufficient documentation

## 2013-07-12 DIAGNOSIS — IMO0001 Reserved for inherently not codable concepts without codable children: Secondary | ICD-10-CM | POA: Insufficient documentation

## 2013-07-14 ENCOUNTER — Ambulatory Visit: Payer: 59 | Admitting: Physical Therapy

## 2013-07-19 ENCOUNTER — Ambulatory Visit: Payer: 59 | Admitting: Physical Therapy

## 2013-07-21 ENCOUNTER — Ambulatory Visit: Payer: 59 | Admitting: Physical Therapy

## 2013-08-26 ENCOUNTER — Encounter: Payer: Self-pay | Admitting: Family Medicine

## 2013-08-26 ENCOUNTER — Ambulatory Visit (INDEPENDENT_AMBULATORY_CARE_PROVIDER_SITE_OTHER): Payer: 59 | Admitting: Family Medicine

## 2013-08-26 VITALS — BP 128/74 | HR 107 | Temp 98.2°F | Resp 16 | Ht 63.5 in | Wt 183.4 lb

## 2013-08-26 DIAGNOSIS — Z79899 Other long term (current) drug therapy: Secondary | ICD-10-CM

## 2013-08-26 DIAGNOSIS — E66811 Obesity, class 1: Secondary | ICD-10-CM

## 2013-08-26 DIAGNOSIS — E785 Hyperlipidemia, unspecified: Secondary | ICD-10-CM

## 2013-08-26 DIAGNOSIS — I10 Essential (primary) hypertension: Secondary | ICD-10-CM

## 2013-08-26 DIAGNOSIS — E119 Type 2 diabetes mellitus without complications: Secondary | ICD-10-CM

## 2013-08-26 DIAGNOSIS — E669 Obesity, unspecified: Secondary | ICD-10-CM

## 2013-08-26 LAB — CBC WITH DIFFERENTIAL/PLATELET
Basophils Absolute: 0 10*3/uL (ref 0.0–0.1)
Basophils Relative: 0 % (ref 0–1)
Lymphocytes Relative: 30 % (ref 12–46)
MCHC: 34.9 g/dL (ref 30.0–36.0)
Monocytes Absolute: 0.6 10*3/uL (ref 0.1–1.0)
Neutro Abs: 4.2 10*3/uL (ref 1.7–7.7)
Platelets: 369 10*3/uL (ref 150–400)
RDW: 14.2 % (ref 11.5–15.5)
WBC: 7.2 10*3/uL (ref 4.0–10.5)

## 2013-08-26 LAB — COMPREHENSIVE METABOLIC PANEL
AST: 18 U/L (ref 0–37)
Albumin: 4.7 g/dL (ref 3.5–5.2)
Alkaline Phosphatase: 124 U/L — ABNORMAL HIGH (ref 39–117)
BUN: 12 mg/dL (ref 6–23)
Glucose, Bld: 78 mg/dL (ref 70–99)
Potassium: 4 mEq/L (ref 3.5–5.3)
Sodium: 139 mEq/L (ref 135–145)
Total Bilirubin: 0.4 mg/dL (ref 0.3–1.2)

## 2013-08-26 LAB — POCT GLYCOSYLATED HEMOGLOBIN (HGB A1C): Hemoglobin A1C: 6.1
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
## 2013-08-26 NOTE — Patient Instructions (Signed)
Lorcaserin oral tablets  What is this medicine?  LORCASERIN (lor ca SER in) is used to promote and maintain weight loss in obese patients. This medicine should be used with a reduced calorie diet and, if appropriate, an exercise program.  This medicine may be used for other purposes; ask your health care provider or pharmacist if you have questions.  COMMON BRAND NAME(S): Belviq  What should I tell my health care provider before I take this medicine?  They need to know if you have any of these conditions:  -anatomical deformation of the penis, Peyronie's disease, or history of priapism (painful and prolonged erection)  -diabetes  -heart disease  -history of blood diseases, like sickle cell anemia or leukemia  -history of irregular heartbeat  -kidney disease  -liver disease  -suicidal thoughts, plans, or attempt; a previous suicide attempt by you or a family member  -an unusual or allergic reaction to lorcaserin, other medicines, foods, dyes, or preservatives  -pregnant or trying to get pregnant  -breast-feeding  How should I use this medicine?  Take this medicine by mouth with a glass of water. Follow the directions on the prescription label. You can take it with or without food. Take your medicine at regular intervals. Do not take it more often than directed. Do not stop taking except on your doctor's advice.  Talk to your pediatrician regarding the use of this medicine in children. Special care may be needed.  Overdosage: If you think you've taken too much of this medicine contact a poison control center or emergency room at once.  Overdosage: If you think you have taken too much of this medicine contact a poison control center or emergency room at once.  NOTE: This medicine is only for you. Do not share this medicine with others.  What if I miss a dose?  If you miss a dose, take it as soon as you can. If it is almost time for your next dose, take only that dose. Do not take double or extra doses.  What may  interact with this medicine?  -cabergoline  -certain medicines for depression, anxiety, or psychotic disturbances  -certain medicines for erectile dysfunction  -certain medicines for migraine headache like almotriptan, eletriptan, frovatriptan, naratriptan, rizatriptan, sumatriptan, zolmitriptan  -dextromethorphan  -linezolid  -MAOIs like Carbex, Eldepryl, Marplan, Nardil, and Parnate  -medicines for diabetes  -orlistat  -tramadol  -St. John's Wort  -stimulant medicines for attention disorders, weight loss, or to stay awake  This list may not describe all possible interactions. Give your health care provider a list of all the medicines, herbs, non-prescription drugs, or dietary supplements you use. Also tell them if you smoke, drink alcohol, or use illegal drugs. Some items may interact with your medicine.  What should I watch for while using this medicine?  This medicine is intended to be used in addition to a healthy diet and appropriate exercise. The best results are achieved this way. Do not increase or in any way change your dose without consulting your doctor or health care professional. Your doctor should tell you to stop taking this medicine if you do not lose a certain amount of weight within the first 12 weeks of treatment.  Visit your doctor or health care professional for regular checkups. Your doctor may order blood tests or other tests to see how you are doing.  Do not drive, use machinery, or do anything that needs mental alertness until you know how this medicine affects you.  This medicine   may affect blood sugar levels. If you have diabetes, check with your doctor or health care professional before you change your diet or the dose of your diabetic medicine.  Patients and their families should watch out for worsening depression or thoughts of suicide. Also watch out for sudden changes in feelings such as feeling anxious, agitated, panicky, irritable, hostile, aggressive, impulsive, severely restless,  overly excited and hyperactive, or not being able to sleep. If this happens, especially at the beginning of treatment or after a change in dose, call your health care professional.  Contact you doctor or health care professional right away if the erection lasts longer than 4 hours or if it becomes painful. This may be a sign of serious problem and must be treated right away to prevent permanent damage.  What side effects may I notice from receiving this medicine?  Side effects that you should report to your doctor or health care professional as soon as possible:  -allergic reactions like skin rash, itching or hives, swelling of the face, lips, or tongue  -abnormal production of milk  -breast enlargement in both males and females  -breathing problems  -changes in emotions or moods  -changes in vision  -confusion  -erection lasting more than 4 hours  -fast or irregular heart beat  -feeling faint or lightheaded, falls  -fever or chills, sore throat  -hallucination, loss of contact with reality  -high or low blood pressure  -menstrual changes  -restlessness  -seizures  -slow or irregular heartbeat  -stiff muscles  -sweating  -suicidal thoughts or other mood changes  -tremors  -trouble walking  -unusually weak or tired  -vomiting   Side effects that usually do not require medical attention (Report these to your doctor or health care professional if they continue or are bothersome.):  -back pain  -constipation  -cough  -dizziness  -dry mouth  -low blood sugar if you have diabetes (ask your doctor or healthcare professional for a list of these symptoms)  -nausea  -tiredness  This list may not describe all possible side effects. Call your doctor for medical advice about side effects. You may report side effects to FDA at 1-800-FDA-1088.  Where should I keep my medicine?  Keep out of the reach of children.  Store at room temperature between 15 and 30 degrees C (59 and 86 degrees F). Throw away any unused medicine after the  expiration date.  NOTE: This sheet is a summary. It may not cover all possible information. If you have questions about this medicine, talk to your doctor, pharmacist, or health care provider.  © 2014, [employer]. (2011-03-11 17:15:17)

## 2013-08-27 ENCOUNTER — Encounter: Payer: Self-pay | Admitting: Family Medicine

## 2013-08-27 MED ORDER — METFORMIN HCL 1000 MG PO TABS: ORAL_TABLET | ORAL | Status: DC

## 2013-08-27 MED ORDER — ATORVASTATIN CALCIUM 40 MG PO TABS: 40.0000 mg | ORAL_TABLET | Freq: Every day | ORAL | Status: DC
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
## 2014-06-23 LAB — HM DIABETES EYE EXAM

## 2014-07-26 ENCOUNTER — Encounter: Payer: Self-pay | Admitting: Family Medicine

## 2014-08-11 ENCOUNTER — Ambulatory Visit: Payer: 59 | Admitting: Family Medicine

## 2014-09-25 LAB — HM MAMMOGRAPHY

## 2014-09-29 ENCOUNTER — Ambulatory Visit: Payer: 59 | Admitting: Family Medicine

## 2014-10-27 ENCOUNTER — Ambulatory Visit (INDEPENDENT_AMBULATORY_CARE_PROVIDER_SITE_OTHER): Payer: 59 | Admitting: Family Medicine

## 2014-10-27 ENCOUNTER — Encounter: Payer: Self-pay | Admitting: Family Medicine

## 2014-10-27 ENCOUNTER — Other Ambulatory Visit: Payer: Self-pay

## 2014-10-27 VITALS — BP 124/80 | HR 95 | Temp 98.4°F | Resp 16 | Ht 63.5 in | Wt 171.8 lb

## 2014-10-27 DIAGNOSIS — M792 Neuralgia and neuritis, unspecified: Secondary | ICD-10-CM

## 2014-10-27 DIAGNOSIS — E669 Obesity, unspecified: Secondary | ICD-10-CM

## 2014-10-27 DIAGNOSIS — I10 Essential (primary) hypertension: Secondary | ICD-10-CM

## 2014-10-27 DIAGNOSIS — K219 Gastro-esophageal reflux disease without esophagitis: Secondary | ICD-10-CM

## 2014-10-27 DIAGNOSIS — J309 Allergic rhinitis, unspecified: Secondary | ICD-10-CM

## 2014-10-27 DIAGNOSIS — E785 Hyperlipidemia, unspecified: Secondary | ICD-10-CM

## 2014-10-27 DIAGNOSIS — E119 Type 2 diabetes mellitus without complications: Secondary | ICD-10-CM

## 2014-10-27 LAB — COMPLETE METABOLIC PANEL WITH GFR
ALBUMIN: 4.6 g/dL (ref 3.5–5.2)
ALT: 18 U/L (ref 0–35)
AST: 14 U/L (ref 0–37)
Alkaline Phosphatase: 119 U/L — ABNORMAL HIGH (ref 39–117)
BUN: 10 mg/dL (ref 6–23)
CO2: 27 meq/L (ref 19–32)
Calcium: 9.4 mg/dL (ref 8.4–10.5)
Chloride: 101 mEq/L (ref 96–112)
Creat: 0.64 mg/dL (ref 0.50–1.10)
GLUCOSE: 107 mg/dL — AB (ref 70–99)
POTASSIUM: 3.8 meq/L (ref 3.5–5.3)
Sodium: 139 mEq/L (ref 135–145)
TOTAL PROTEIN: 7.8 g/dL (ref 6.0–8.3)
Total Bilirubin: 0.6 mg/dL (ref 0.2–1.2)

## 2014-10-27 LAB — POCT GLYCOSYLATED HEMOGLOBIN (HGB A1C): Hemoglobin A1C: 6

## 2014-10-27 LAB — LIPID PANEL
CHOL/HDL RATIO: 3.3 ratio
Cholesterol: 161 mg/dL (ref 0–200)
HDL: 49 mg/dL (ref >39)
LDL CALC: 89 mg/dL (ref 0–99)
TRIGLYCERIDES: 114 mg/dL (ref <150)
VLDL: 23 mg/dL (ref 0–40)

## 2014-10-27 LAB — MICROALBUMIN, URINE: Microalb, Ur: 0.7 mg/dL (ref <2.0)

## 2014-10-27 MED ORDER — CETIRIZINE HCL 10 MG PO TABS: 10.0000 mg | ORAL_TABLET | Freq: Every day | ORAL | Status: DC

## 2014-10-27 MED ORDER — METFORMIN HCL 1000 MG PO TABS: ORAL_TABLET | ORAL | Status: DC

## 2014-10-27 MED ORDER — IMIPRAMINE HCL 50 MG PO TABS: 50.0000 mg | ORAL_TABLET | Freq: Every day | ORAL | Status: DC

## 2014-10-27 MED ORDER — METFORMIN HCL ER (OSM) 1000 MG PO TB24: 2000.0000 mg | ORAL_TABLET | Freq: Every day | ORAL | Status: DC

## 2014-10-27 MED ORDER — BENZONATATE 200 MG PO CAPS: 200.0000 mg | ORAL_CAPSULE | Freq: Three times a day (TID) | ORAL | Status: DC | PRN

## 2014-10-27 MED ORDER — ATORVASTATIN CALCIUM 40 MG PO TABS: 40.0000 mg | ORAL_TABLET | Freq: Every day | ORAL | Status: DC

## 2014-10-27 MED ORDER — FLUTICASONE PROPIONATE 50 MCG/ACT NA SUSP: 2.0000 | Freq: Every day | NASAL | Status: DC

## 2014-10-27 MED ORDER — METFORMIN HCL ER 500 MG PO TB24: 1500.0000 mg | ORAL_TABLET | Freq: Every day | ORAL | Status: DC

## 2014-10-27 MED ORDER — AMLODIPINE BESYLATE 10 MG PO TABS: 10.0000 mg | ORAL_TABLET | Freq: Every day | ORAL | Status: DC

## 2014-10-27 MED ORDER — ESOMEPRAZOLE MAGNESIUM 40 MG PO CPDR: 40.0000 mg | DELAYED_RELEASE_CAPSULE | Freq: Every day | ORAL | Status: DC

## 2014-10-27 NOTE — Progress Notes (Signed)
Subjective:    Patient ID: [name], female    DOB: 17-Jul-1961, 54 y.o.   MRN: [id number]  Chief Complaint  Patient presents with  . Medication Refill    all meds  . Bloodwork   Patient Active Problem List   Diagnosis Date Noted  . Other and unspecified hyperlipidemia 03/25/2013  . Hypertension 03/25/2013  . Hyperglycemia 11/01/2012   Prior to Admission medications   Medication Sig Start Date End Date Taking? Authorizing Provider  amLODipine (NORVASC) 10 MG tablet Take 1 tablet (10 mg total) by mouth daily. 03/25/13  Yes Shawnee Knapp, MD  aspirin 81 MG tablet Take 81 mg by mouth daily.     Yes Historical Provider, MD  atorvastatin (LIPITOR) 40 MG tablet Take 1 tablet (40 mg total) by mouth at bedtime. 08/27/13  Yes Shawnee Knapp, MD  Calcium-Vitamin D (CALTRATE 600 PLUS-VIT D PO) Take 1 tablet by mouth 2 (two) times daily.     Yes Historical Provider, MD  esomeprazole (NEXIUM) 40 MG capsule Take 1 capsule (40 mg total) by mouth at bedtime. 03/25/13  Yes Shawnee Knapp, MD  glucose blood test strip Use to check blood sugar daily. 03/30/13  Yes Shawnee Knapp, MD  imipramine (TOFRANIL) 50 MG tablet Take 1 tablet (50 mg total) by mouth at bedtime. 03/25/13  Yes Shawnee Knapp, MD  Lancets MISC USE TO CHECK BLOOD SUGAR DAILY. 03/30/13  Yes Shawnee Knapp, MD  metFORMIN (GLUCOPHAGE) 1000 MG tablet Take 1/2 tab po qbreakfast and 1 tab po qsupper. 08/27/13  Yes Shawnee Knapp, MD   Medications, allergies, past medical history, surgical history, family history, social history and problem list reviewed and updated.  HPI  67 yof with pmh dm2, htn, high cho presents today for lab work/med refills.  Doing well since she last saw [place] 14 months ago.  DM - Checks BG at home approx 2x/week. Runs 90-110 in am and 120s at night. Denies polyuria, polydipsia, ha, dizziness, vomiting. Denies vision changes. 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 She still experiences a bit of nausea with her am  metformin dose.   Avg diet: B: Cereal or pop tart. L: Tuna or chicken sandwich with fries from cafeteria. D: Meat (chicken/steak, veggies, salad with [place]. Drinks: One pepsi a day. Down from 2-3 day previously. Snacks: Fruit or chips. Sometimes donuts.   When she was last here she was offered belviq for continued struggles with weight. Pt never took this med. She is down 12# from last visit 14 months ago. States she thinks its from drinking less soda and being active at work.   HTN - Does not check at home. Denies cp, dizziness, syncope, ha, vision changes, LE edema.  High cho - see diet as above. Tolerating 40 mg lipitor well as we increased to this dose from 20 mg when we last saw her 14 months ago. Denies muscle aches/cramping.   She needs refills of amlodipine, metformin, lipitor, nexium, and imipramine (prescribed by diff provider for left sided flank nerve pain)  She mentions that she has stopped taking both ace and arb due to cough.   Received flu vaccine at work. Not interested in pneumovax.   Review of Systems See HPI.     Objective:   Physical Exam  Constitutional: She is oriented to person, place, and time. She appears well-developed and well-nourished.  Non-toxic appearance. She does not have a sickly appearance.  She does not appear ill. No distress.  BP 124/80 mmHg  Pulse 95  Temp(Src) 98.4 F (36.9 C) (Oral)  Resp 16  Ht 5' 3.5" (1.613 m)  Wt 171 lb 12.8 oz (77.928 kg)  BMI 29.95 kg/m2  SpO2 98%   Cardiovascular: Normal rate, regular rhythm and normal heart sounds.  Exam reveals no gallop.   No murmur heard. Pulmonary/Chest: Effort normal and breath sounds normal. She has no decreased breath sounds. She has no wheezes. She has no rhonchi. She has no rales.  Neurological: She is alert and oriented to person, place, and time.  Psychiatric: She has a normal mood and affect. Her speech is normal.   Results for orders placed or performed in visit on 10/27/14    POCT glycosylated hemoglobin (Hb A1C)  Result Value Ref Range   Hemoglobin A1C 6.0       Assessment & Plan:   64 yof with pmh dm2, htn, high cho presents today for lab work/med refills.  Diabetes mellitus without complication - Plan: [facility] Diabetes Foot Exam, POCT glycosylated hemoglobin (Hb A1C), COMPLETE METABOLIC PANEL WITH GFR, Lipid panel, Microalbumin, urine, metFORMIN (GLUCOPHAGE) 1000 MG tablet, metformin (FORTAMET) 1000 MG (OSM) 24 hr tablet --a1c down to 6.0 from 6.1 --urine ma today --as metformin am dose continues to cause nausea, change from metformin 500 am 1000 pm to 2000 mg xr metformin in am  --continue cutting back on soda --rtc one year   Essential hypertension - Plan: COMPLETE METABOLIC PANEL WITH GFR, amLODipine (NORVASC) 10 MG tablet --well controlled today --amlodipine one year --cmp/gfr today  Obesity (BMI 30.0-34.9) --down 12# from last visit --encouraged to continue limiting soda, encouraged to increase physical activity  Hyperlipidemia - Plan: Lipid panel, atorvastatin (LIPITOR) 40 MG tablet --refilled 40 lipitor, tolerating well --fasting lipids today  Neuropathic pain - Plan: imipramine (TOFRANIL) 50 MG tablet  Gastroesophageal reflux disease, esophagitis presence not specified - Plan: esomeprazole (NEXIUM) 40 MG capsule --denies reflux sx, nexium filled today  Allergic rhinitis, unspecified allergic rhinitis type --flonase, zyrtec recommended  Julieta Gutting, PA-C Physician Assistant-Certified [facility]  10/27/2014 11:35 AM

## 2014-10-27 NOTE — Telephone Encounter (Signed)
Looks like you prescribed both metformin and metformin XR. Was that intentional?  If you DO intend for the patient to receive the extended release product, please either change the Rx to 500 mg (2 PO.Marland Kitchen) or we'll need to initiate a prior authorization for the 100 mg tablets.

## 2014-10-27 NOTE — Patient Instructions (Addendum)
Congratulations on the weight loss and the decrease in A1C from 6.1 to 6.0. Continue to try to limit soda daily and eventually work your way down to no pepsi. I'll be in contact with you with the lab results that we drew today. We've refilled your labs for one year.  Please come back to see [place] in one year for follow up for the diabetes, high blood pressure, and high cholesterol.  We have changed the metformin to 2000 mg extended release in hopes that will help with the nausea. Please take this once at night. For the chronic cough, please take use the flonase and zyrtec in case allergies are a cause. If the cough persists or worsens please let us know as a chest xray may be in line at that time.  Please come see [place] if you need anything in the mean time. Let us know if you change your mind about the pneumonia vaccine.

## 2014-10-27 NOTE — Telephone Encounter (Signed)
Pt's original metformin was discontinued. Pt is now to take 1500 mg metformin xr qd. As these are 500 mg tabs she will take 3 tabs daily.

## 2014-10-27 NOTE — Telephone Encounter (Signed)
The pharmacy called and states the Metformin that was prescribed will need a PA. The insurance will pay for 500 XR. Can we change the Rx?  metformin (FORTAMET) 1000 MG (OSM) 24 hr tablet [[id number]]      Order Details    Dose: 2,000 mg Route: Oral Frequency: Daily with breakfast   Dispense Quantity:  60 tablet Refills:  11 Fills Remaining:  11          Sig: Take 2 tablets (2,000 mg total) by mouth daily with breakfast.         Rx pended.

## 2014-10-29 NOTE — Progress Notes (Signed)
Chronic cough - no sxs of gerd but pt continues on prilosec. No a.m. Water brash, sore throat, hoarseness, heartburn. No sig improvement since stopping acei/arb. Occurs daily - people often comment about it to her.  No h/o asthma or tob use. No wheezing or SHoB. Cough dry but feels deep not from a tickle in throat.  No sig seasonal allergies.  Suspect this is due to chronic post-nasal drip/upper airway cough syndrome.  Start qhs zyrtec and flonase for at least 1 mo w/ prn tessalon or delsym - if cough does not resolve, consider CXR, PFTs, and pulm referral for further eval as needed.  Pt assessed, reviewed documentation and agree w/ assessment and plan. Delman Cheadle, MD MPH

## 2015-02-14 ENCOUNTER — Encounter: Payer: Self-pay | Admitting: *Deleted

## 2015-03-02 ENCOUNTER — Telehealth: Payer: Self-pay | Admitting: *Deleted

## 2015-03-02 NOTE — Telephone Encounter (Signed)
Patient phoned in response to my letter and stated she had her mammo done at [facility] back in January 2016.  Am faxing request for results.

## 2015-03-07 NOTE — Telephone Encounter (Signed)
Re-faxed mammo request

## 2015-03-09 NOTE — Telephone Encounter (Signed)
Received mammo report from [facility] [place] dated 09/25/2014 - no mammographic evidence of malignancy.  Updated health maintenance, care team for gyn, abstracted & sent to med rec for scanning into EMR.

## 2015-03-21 ENCOUNTER — Encounter: Payer: Self-pay | Admitting: *Deleted

## 2015-03-29 ENCOUNTER — Encounter: Payer: Self-pay | Admitting: *Deleted

## 2015-07-18 ENCOUNTER — Encounter: Payer: Self-pay | Admitting: Internal Medicine

## 2015-09-03 ENCOUNTER — Telehealth: Payer: Self-pay | Admitting: Family Medicine

## 2015-09-03 NOTE — Telephone Encounter (Signed)
lmom to call and reschedule her appt th she had with Brigitte Pulse on 10-04-15

## 2015-09-21 MED FILL — ESOMEPRAZOLE MAG DR 40 MG C: 40 | 30 days supply | Qty: 30 | Fill #7

## 2015-09-21 MED FILL — ATORVASTATIN 40 MG TABLET: 40 | 90 days supply | Qty: 90 | Fill #3

## 2015-09-21 MED FILL — METFORMIN HCL ER 500 MG TAB: 500 | 30 days supply | Qty: 90 | Fill #5

## 2015-09-21 MED FILL — IMIPRAMINE HCL 50 MG TABLET: 50 | 30 days supply | Qty: 30 | Fill #8

## 2015-09-23 ENCOUNTER — Telehealth: Payer: Self-pay | Admitting: Family Medicine

## 2015-09-23 NOTE — Telephone Encounter (Signed)
lmom to call and reschedule her appt date and time from Thursday 11-01-15 @ 11:15 to Tuesday 10-30-15 @11 :15 with shaw

## 2015-10-04 ENCOUNTER — Ambulatory Visit: Payer: 59 | Admitting: Family Medicine

## 2015-11-01 ENCOUNTER — Ambulatory Visit: Payer: 59 | Admitting: Family Medicine

## 2015-11-07 DIAGNOSIS — R768 Other specified abnormal immunological findings in serum: Secondary | ICD-10-CM

## 2015-11-07 HISTORY — DX: Other specified abnormal immunological findings in serum: R76.8

## 2015-11-15 ENCOUNTER — Ambulatory Visit (INDEPENDENT_AMBULATORY_CARE_PROVIDER_SITE_OTHER): Payer: 59 | Admitting: Family Medicine

## 2015-11-15 ENCOUNTER — Encounter: Payer: Self-pay | Admitting: Family Medicine

## 2015-11-15 VITALS — BP 130/82 | HR 105 | Temp 98.0°F | Resp 16 | Ht 64.0 in | Wt 182.0 lb

## 2015-11-15 DIAGNOSIS — E119 Type 2 diabetes mellitus without complications: Secondary | ICD-10-CM | POA: Diagnosis not present

## 2015-11-15 DIAGNOSIS — Z113 Encounter for screening for infections with a predominantly sexual mode of transmission: Secondary | ICD-10-CM | POA: Diagnosis not present

## 2015-11-15 DIAGNOSIS — E785 Hyperlipidemia, unspecified: Secondary | ICD-10-CM

## 2015-11-15 DIAGNOSIS — Z1321 Encounter for screening for nutritional disorder: Secondary | ICD-10-CM | POA: Diagnosis not present

## 2015-11-15 DIAGNOSIS — Z136 Encounter for screening for cardiovascular disorders: Secondary | ICD-10-CM

## 2015-11-15 DIAGNOSIS — K219 Gastro-esophageal reflux disease without esophagitis: Secondary | ICD-10-CM | POA: Diagnosis not present

## 2015-11-15 DIAGNOSIS — M792 Neuralgia and neuritis, unspecified: Secondary | ICD-10-CM | POA: Diagnosis not present

## 2015-11-15 DIAGNOSIS — H52223 Regular astigmatism, bilateral: Secondary | ICD-10-CM | POA: Diagnosis not present

## 2015-11-15 DIAGNOSIS — H524 Presbyopia: Secondary | ICD-10-CM | POA: Diagnosis not present

## 2015-11-15 DIAGNOSIS — I10 Essential (primary) hypertension: Secondary | ICD-10-CM

## 2015-11-15 DIAGNOSIS — Z79899 Other long term (current) drug therapy: Secondary | ICD-10-CM

## 2015-11-15 DIAGNOSIS — Z1383 Encounter for screening for respiratory disorder NEC: Secondary | ICD-10-CM

## 2015-11-15 DIAGNOSIS — Z23 Encounter for immunization: Secondary | ICD-10-CM | POA: Diagnosis not present

## 2015-11-15 DIAGNOSIS — R739 Hyperglycemia, unspecified: Secondary | ICD-10-CM

## 2015-11-15 DIAGNOSIS — Z1389 Encounter for screening for other disorder: Secondary | ICD-10-CM | POA: Diagnosis not present

## 2015-11-15 DIAGNOSIS — H5203 Hypermetropia, bilateral: Secondary | ICD-10-CM | POA: Diagnosis not present

## 2015-11-15 DIAGNOSIS — R7309 Other abnormal glucose: Secondary | ICD-10-CM | POA: Diagnosis not present

## 2015-11-15 LAB — LIPID PANEL
CHOL/HDL RATIO: 5.4 ratio — AB (ref <=5.0)
CHOLESTEROL: 190 mg/dL (ref 125–200)
HDL: 35 mg/dL — ABNORMAL LOW (ref >=46)
LDL Cholesterol: 112 mg/dL (ref <130)
TRIGLYCERIDES: 216 mg/dL — AB (ref <150)
VLDL: 43 mg/dL — AB (ref <30)

## 2015-11-15 LAB — COMPREHENSIVE METABOLIC PANEL
ALBUMIN: 4.4 g/dL (ref 3.6–5.1)
ALK PHOS: 116 U/L (ref 33–130)
ALT: 27 U/L (ref 6–29)
AST: 18 U/L (ref 10–35)
BUN: 8 mg/dL (ref 7–25)
CHLORIDE: 102 mmol/L (ref 98–110)
CO2: 24 mmol/L (ref 20–31)
CREATININE: 0.56 mg/dL (ref 0.50–1.05)
Calcium: 9.2 mg/dL (ref 8.6–10.4)
Glucose, Bld: 112 mg/dL — ABNORMAL HIGH (ref 65–99)
POTASSIUM: 3.8 mmol/L (ref 3.5–5.3)
Sodium: 138 mmol/L (ref 135–146)
TOTAL PROTEIN: 7.9 g/dL (ref 6.1–8.1)
Total Bilirubin: 0.6 mg/dL (ref 0.2–1.2)

## 2015-11-15 LAB — HM DIABETES EYE EXAM

## 2015-11-15 LAB — MICROALBUMIN / CREATININE URINE RATIO
CREATININE, URINE: 112 mg/dL (ref 20–320)
MICROALB UR: 0.7 mg/dL
Microalb Creat Ratio: 6 mcg/mg creat (ref <30)

## 2015-11-15 LAB — POCT GLYCOSYLATED HEMOGLOBIN (HGB A1C): Hemoglobin A1C: 6.5

## 2015-11-15 MED ORDER — IMIPRAMINE HCL 50 MG PO TABS: 50.0000 mg | ORAL_TABLET | Freq: Every day | ORAL | Status: DC

## 2015-11-15 MED ORDER — ESOMEPRAZOLE MAGNESIUM 40 MG PO CPDR: 40.0000 mg | DELAYED_RELEASE_CAPSULE | Freq: Every day | ORAL | Status: DC

## 2015-11-15 MED ORDER — AMLODIPINE BESYLATE 10 MG PO TABS: 10.0000 mg | ORAL_TABLET | Freq: Every day | ORAL | Status: DC

## 2015-11-15 MED FILL — AMLODIPINE BESYLATE 10 MG T: 10 | 90 days supply | Qty: 90 | Fill #0

## 2015-11-15 MED FILL — ESOMEPRAZOLE MAG DR 40 MG C: 40 | 90 days supply | Qty: 90 | Fill #0

## 2015-11-15 MED FILL — IMIPRAMINE HCL 50 MG TABLET: 50 | 90 days supply | Qty: 90 | Fill #0

## 2015-11-15 NOTE — Patient Instructions (Signed)

## 2015-11-15 NOTE — Progress Notes (Addendum)
Subjective:    Patient ID: [name], female    DOB: [birth year], 55 y.o.   MRN: [id number] Chief Complaint  Patient presents with  . Medication Refill    HPI Has been working very long hours.  Husband has had the flu and shew got a few aches but had flu shot but then had a cough for weeks.  Has been takig 2 tabs in the metformin. Walking less with foot.  Had esophageal strictutres so stays on nexium but gets upper chest sxs if skips ppi fr a few days  Past Medical History  Diagnosis Date  . Hyperlipidemia   . Hypertension   . Pre-diabetes   . Obesity (BMI 30-39.9)   . Endometriosis   . GERD (gastroesophageal reflux disease)   . Fibroids     uterine  . Hiatal hernia   . Peptic stricture of esophagus   . Uterine fibroid   . Kidney stone 09-2012   Past Surgical History  Procedure Laterality Date  . Abdominal hysterectomy  2008  . Breast cyst excision  2007  . Exploratory laparotomy     Current Outpatient Prescriptions on File Prior to Visit  Medication Sig Dispense Refill  . aspirin 81 MG tablet Take 81 mg by mouth daily.      Marland Kitchen atorvastatin (LIPITOR) 40 MG tablet Take 1 tablet (40 mg total) by mouth at bedtime. 90 tablet 3  . Calcium-Vitamin D (CALTRATE 600 PLUS-VIT D PO) Take 1 tablet by mouth 2 (two) times daily.      Marland Kitchen glucose blood test strip Use to check blood sugar daily. 100 each 3  . Lancets MISC USE TO CHECK BLOOD SUGAR DAILY. 100 each 3  . metFORMIN (GLUCOPHAGE XR) 500 MG 24 hr tablet Take 3 tablets (1,500 mg total) by mouth daily with breakfast. 90 tablet 11   No current facility-administered medications on file prior to visit.   Allergies  Allergen Reactions  . Darvocet [Propoxyphene N-Acetaminophen] Nausea Only  . Lisinopril Cough  . Losartan Cough   Family History  Problem Relation Age of Onset  . Diabetes Mother   . Hypertension Father   . Heart attack Father   . Heart disease Sister   . Diabetes Sister   . Heart disease Brother   .  Diabetes Brother   . Hyperlipidemia Brother   . Stroke Brother   . Colon cancer Neg Hx    Social History   Social History  . Marital Status: Married    Spouse Name: N/A  . Number of Children: N/A  . Years of Education: N/A   Occupational History  . Pharmacy Tech     Social History Main Topics  . Smoking status: Never Smoker   . Smokeless tobacco: Never Used  . Alcohol Use: 0.6 oz/week    1 Glasses of wine per week     Comment: 1-2 per week  . Drug Use: No  . Sexual Activity: Yes   Other Topics Concern  . None   Social History Narrative   2 caffeine drinks daily    Depression screen [facility] 2/9 11/15/2015 10/27/2014  Decreased Interest 0 0  Down, Depressed, Hopeless 0 0  PHQ - 2 Score 0 0     Review of Systems  Constitutional: Positive for activity change and fatigue. Negative for fever, chills and appetite change.  HENT: Negative for sore throat, trouble swallowing and voice change.   Respiratory: Positive for cough. Negative for chest tightness, shortness of breath  and wheezing.   Cardiovascular: Negative for chest pain.  Gastrointestinal: Positive for nausea. Negative for vomiting, abdominal pain, diarrhea and constipation.  Musculoskeletal: Positive for myalgias, back pain, joint swelling, arthralgias and gait problem.  Allergic/Immunologic: Positive for environmental allergies.  Psychiatric/Behavioral: Positive for sleep disturbance. Negative for behavioral problems, confusion and dysphoric mood. The patient is hyperactive. The patient is not nervous/anxious.        Objective:  BP 130/82 mmHg  Pulse 105  Temp(Src) 98 F (36.7 C)  Resp 16  Ht 5\' 4"  (1.626 m)  Wt 182 lb (82.555 kg)  BMI 31.22 kg/m2  Physical Exam  Constitutional: She is oriented to person, place, and time. She appears well-developed and well-nourished. No distress.  HENT:  Head: Normocephalic and atraumatic.  Right Ear: External ear normal.  Left Ear: External ear normal.  Eyes:  Conjunctivae are normal. No scleral icterus.  Neck: Normal range of motion. Neck supple. No thyromegaly present.  Cardiovascular: Normal rate, regular rhythm, normal heart sounds and intact distal pulses.   Pulmonary/Chest: Effort normal and breath sounds normal. No respiratory distress.  Musculoskeletal: She exhibits no edema.  Lymphadenopathy:    She has no cervical adenopathy.  Neurological: She is alert and oriented to person, place, and time.  Skin: Skin is warm and dry. She is not diaphoretic. No erythema.  Psychiatric: She has a normal mood and affect. Her behavior is normal.   BP 130/82   Pulse (!) 105   Temp 98 F (36.7 C)   Resp 16   Ht 5\' 4"  (1.626 m)   Wt 182 lb (82.6 kg)   BMI 31.24 kg/m   [facility] reading (PRIMARY) by  Dr. Brigitte Pulse. EKG: NSR, rate 97, no ischemic change. No prior in Epic for comparison.    Assessment & Plan:  Needs pneumovax lipitor after lipid 1. Hyperglycemia   2. Essential hypertension   3. Hyperlipidemia LDL goal <100   4. Medication management   5. Encounter for vitamin deficiency screening   6. Screening for cardiovascular, respiratory, and genitourinary diseases   7. Routine screening for STI (sexually transmitted infection)   8. Need for prophylactic vaccination and inoculation against single disease   9. Gastroesophageal reflux disease, esophagitis presence not specified   10. Neuropathic pain     Orders Placed This Encounter  Procedures  . Pneumococcal polysaccharide vaccine 23-valent greater than or equal to 2yo subcutaneous/IM  . Comprehensive metabolic panel    Order Specific Question:  Has the patient fasted?    Answer:  Yes  . Lipid panel    Order Specific Question:  Has the patient fasted?    Answer:  Yes  . Microalbumin/Creatinine Ratio, Urine  . VITAMIN D 25 Hydroxy (Vit-D Deficiency, Fractures)  . HIV antibody  . Hepatitis C Antibody  . Hepatitis B surface antibody  . Measles/Mumps/Rubella Immunity  . Hepatitis C RNA  quantitative  . POCT glycosylated hemoglobin (Hb A1C)  . EKG 12-Lead  . HM Diabetes Foot Exam    Meds ordered this encounter  Medications  . amLODipine (NORVASC) 10 MG tablet    Sig: Take 1 tablet (10 mg total) by mouth daily.    Dispense:  90 tablet    Refill:  3  . esomeprazole (NEXIUM) 40 MG capsule    Sig: Take 1 capsule (40 mg total) by mouth at bedtime.    Dispense:  90 capsule    Refill:  3  . imipramine (TOFRANIL) 50 MG tablet    Sig: Take  1 tablet (50 mg total) by mouth at bedtime.    Dispense:  90 tablet    Refill:  3    Delman Cheadle, MD MPH

## 2015-11-16 LAB — HEPATITIS B SURFACE ANTIBODY, QUANTITATIVE: Hepatitis B-Post: 0 m[IU]/mL

## 2015-11-16 LAB — HIV ANTIBODY (ROUTINE TESTING W REFLEX): HIV: NONREACTIVE

## 2015-11-16 LAB — VITAMIN D 25 HYDROXY (VIT D DEFICIENCY, FRACTURES): VIT D 25 HYDROXY: 18 ng/mL — AB (ref 30–100)

## 2015-11-16 LAB — MEASLES/MUMPS/RUBELLA IMMUNITY
Mumps IgG: 141 AU/mL — ABNORMAL HIGH (ref <9.00)
Rubella: <0.9 Index (ref <0.90)
Rubeola IgG: >300 AU/mL — ABNORMAL HIGH (ref <25.00)

## 2015-11-16 LAB — HEPATITIS C ANTIBODY: HCV AB: REACTIVE — AB

## 2015-11-19 LAB — HEPATITIS C RNA QUANTITATIVE: HCV Quantitative: NOT DETECTED IU/mL (ref <15)

## 2016-01-02 ENCOUNTER — Encounter: Payer: Self-pay | Admitting: Family Medicine

## 2016-02-19 MED FILL — IMIPRAMINE HCL 50 MG TABLET: 50 | 90 days supply | Qty: 90 | Fill #1

## 2016-02-19 MED FILL — ESOMEPRAZOLE MAG DR 40 MG C: 40 | 90 days supply | Qty: 90 | Fill #1

## 2016-02-20 ENCOUNTER — Other Ambulatory Visit: Payer: Self-pay

## 2016-02-20 MED ORDER — METFORMIN HCL ER 500 MG PO TB24: 1500.0000 mg | ORAL_TABLET | Freq: Every day | ORAL | Status: DC

## 2016-02-20 MED FILL — METFORMIN HCL ER 500 MG TAB: 500 | 90 days supply | Qty: 270 | Fill #0

## 2016-03-24 DIAGNOSIS — Z01419 Encounter for gynecological examination (general) (routine) without abnormal findings: Secondary | ICD-10-CM | POA: Diagnosis not present

## 2016-03-24 DIAGNOSIS — Z8249 Family history of ischemic heart disease and other diseases of the circulatory system: Secondary | ICD-10-CM | POA: Diagnosis not present

## 2016-03-24 DIAGNOSIS — Z6832 Body mass index (BMI) 32.0-32.9, adult: Secondary | ICD-10-CM | POA: Diagnosis not present

## 2016-03-24 DIAGNOSIS — Z1321 Encounter for screening for nutritional disorder: Secondary | ICD-10-CM | POA: Diagnosis not present

## 2016-03-24 DIAGNOSIS — Z1231 Encounter for screening mammogram for malignant neoplasm of breast: Secondary | ICD-10-CM | POA: Diagnosis not present

## 2016-04-01 DIAGNOSIS — D1722 Benign lipomatous neoplasm of skin and subcutaneous tissue of left arm: Secondary | ICD-10-CM | POA: Diagnosis not present

## 2016-05-16 DIAGNOSIS — R2232 Localized swelling, mass and lump, left upper limb: Secondary | ICD-10-CM | POA: Diagnosis not present

## 2016-05-21 ENCOUNTER — Other Ambulatory Visit: Payer: Self-pay | Admitting: Surgery

## 2016-05-21 DIAGNOSIS — R229 Localized swelling, mass and lump, unspecified: Principal | ICD-10-CM

## 2016-05-21 DIAGNOSIS — IMO0002 Reserved for concepts with insufficient information to code with codable children: Secondary | ICD-10-CM

## 2016-05-26 ENCOUNTER — Ambulatory Visit
Admission: RE | Admit: 2016-05-26 | Discharge: 2016-05-26 | Disposition: A | Discharge status: Home / Self Care | Payer: 59 | Source: Ambulatory Visit | Attending: Surgery | Admitting: Surgery

## 2016-05-26 DIAGNOSIS — M9987 Other biomechanical lesions of upper extremity: Secondary | ICD-10-CM | POA: Diagnosis not present

## 2016-05-26 DIAGNOSIS — R229 Localized swelling, mass and lump, unspecified: Principal | ICD-10-CM

## 2016-05-26 DIAGNOSIS — IMO0002 Reserved for concepts with insufficient information to code with codable children: Secondary | ICD-10-CM

## 2016-06-02 ENCOUNTER — Ambulatory Visit: Payer: Self-pay | Admitting: Surgery

## 2016-06-18 ENCOUNTER — Emergency Department (HOSPITAL_BASED_OUTPATIENT_CLINIC_OR_DEPARTMENT_OTHER)
Admission: EM | Admit: 2016-06-18 | Discharge: 2016-06-18 | Disposition: A | Discharge status: Home / Self Care | Payer: 59 | Attending: Physician Assistant | Admitting: Physician Assistant

## 2016-06-18 ENCOUNTER — Emergency Department (HOSPITAL_BASED_OUTPATIENT_CLINIC_OR_DEPARTMENT_OTHER): Payer: 59

## 2016-06-18 ENCOUNTER — Encounter (HOSPITAL_BASED_OUTPATIENT_CLINIC_OR_DEPARTMENT_OTHER): Payer: Self-pay | Admitting: *Deleted

## 2016-06-18 DIAGNOSIS — K449 Diaphragmatic hernia without obstruction or gangrene: Secondary | ICD-10-CM | POA: Diagnosis not present

## 2016-06-18 DIAGNOSIS — Z7982 Long term (current) use of aspirin: Secondary | ICD-10-CM | POA: Insufficient documentation

## 2016-06-18 DIAGNOSIS — Z79899 Other long term (current) drug therapy: Secondary | ICD-10-CM | POA: Insufficient documentation

## 2016-06-18 DIAGNOSIS — M549 Dorsalgia, unspecified: Secondary | ICD-10-CM | POA: Insufficient documentation

## 2016-06-18 DIAGNOSIS — I10 Essential (primary) hypertension: Secondary | ICD-10-CM | POA: Diagnosis not present

## 2016-06-18 DIAGNOSIS — R109 Unspecified abdominal pain: Secondary | ICD-10-CM | POA: Insufficient documentation

## 2016-06-18 LAB — URINALYSIS, ROUTINE W REFLEX MICROSCOPIC
Glucose, UA: NEGATIVE mg/dL
HGB URINE DIPSTICK: NEGATIVE
KETONES UR: NEGATIVE mg/dL
NITRITE: NEGATIVE
PH: 6 (ref 5.0–8.0)
PROTEIN: NEGATIVE mg/dL
SPECIFIC GRAVITY, URINE: 1.022 (ref 1.005–1.030)

## 2016-06-18 LAB — BASIC METABOLIC PANEL
Anion gap: 6 (ref 5–15)
BUN: 14 mg/dL (ref 6–20)
CO2: 28 mmol/L (ref 22–32)
Calcium: 9.4 mg/dL (ref 8.9–10.3)
Chloride: 103 mmol/L (ref 101–111)
Creatinine, Ser: 0.54 mg/dL (ref 0.44–1.00)
GFR calc Af Amer: >60 mL/min (ref >60)
GFR calc non Af Amer: >60 mL/min (ref >60)
Glucose, Bld: 137 mg/dL — ABNORMAL HIGH (ref 65–99)
Potassium: 3.4 mmol/L — ABNORMAL LOW (ref 3.5–5.1)
Sodium: 137 mmol/L (ref 135–145)

## 2016-06-18 LAB — URINE MICROSCOPIC-ADD ON

## 2016-06-18 NOTE — ED Provider Notes (Signed)
[facility] Provider Note   CSN: [id number] Arrival date & time: 06/18/16  1425     History   Chief Complaint Chief Complaint  Patient presents with  . Flank Pain    HPI [name] is a 55 y.o. female.  HPI   55 year old female presents today with right flank pain. Patient reports symptoms started yesterday while driving her car, describes as sharp in nature and persistent. She reports symptoms are similar to previous in which she was diagnosed with kidney stones. She denies any abdominal pain, nausea, vomiting, dysuria, or any other concerning signs or symptoms. Patient denies any injury to the back, somewhat worsened with positioning, with no significant tenderness palpation.     Past Medical History:  Diagnosis Date  . Endometriosis   . Fibroids    uterine  . GERD (gastroesophageal reflux disease)   . Hiatal hernia   . Hyperlipidemia   . Hypertension   . Kidney stone 09-2012  . Obesity (BMI 30-39.9)   . Peptic stricture of esophagus   . Pre-diabetes   . Uterine fibroid     Patient Active Problem List   Diagnosis Date Noted  . Hyperlipidemia LDL goal <100 03/25/2013  . Hypertension 03/25/2013  . Hyperglycemia 11/01/2012    Past Surgical History:  Procedure Laterality Date  . ABDOMINAL HYSTERECTOMY  2008  . BREAST CYST EXCISION  2007  . EXPLORATORY LAPAROTOMY      OB History    No data available       Home Medications    Prior to Admission medications   Medication Sig Start Date End Date Taking? Authorizing Provider  amLODipine (NORVASC) 10 MG tablet Take 1 tablet (10 mg total) by mouth daily. 11/15/15   Shawnee Knapp, MD  aspirin 81 MG tablet Take 81 mg by mouth daily.      Historical Provider, MD  atorvastatin (LIPITOR) 40 MG tablet Take 1 tablet (40 mg total) by mouth at bedtime. 10/27/14   Araceli Bouche, PA  Calcium-Vitamin D (CALTRATE 600 PLUS-VIT D PO) Take 1 tablet by mouth 2 (two) times daily.      Historical Provider, MD    esomeprazole (NEXIUM) 40 MG capsule Take 1 capsule (40 mg total) by mouth at bedtime. 11/15/15   Shawnee Knapp, MD  glucose blood test strip Use to check blood sugar daily. 03/30/13   Shawnee Knapp, MD  imipramine (TOFRANIL) 50 MG tablet Take 1 tablet (50 mg total) by mouth at bedtime. 11/15/15   Shawnee Knapp, MD  Lancets MISC USE TO CHECK BLOOD SUGAR DAILY. 03/30/13   Shawnee Knapp, MD  metFORMIN (GLUCOPHAGE XR) 500 MG 24 hr tablet Take 3 tablets (1,500 mg total) by mouth daily with breakfast. 02/20/16   Wardell Honour, MD    Family History Family History  Problem Relation Age of Onset  . Diabetes Mother   . Hypertension Father   . Heart attack Father   . Heart disease Sister   . Diabetes Sister   . Heart disease Brother   . Diabetes Brother   . Hyperlipidemia Brother   . Stroke Brother   . Colon cancer Neg Hx     Social History Social History  Substance Use Topics  . Smoking status: Never Smoker  . Smokeless tobacco: Never Used  . Alcohol use 0.6 oz/week    1 Glasses of wine per week     Comment: 1-2 per week     Allergies  Darvocet [propoxyphene n-acetaminophen]; Lisinopril; and Losartan   Review of Systems Review of Systems  All other systems reviewed and are negative.    Physical Exam Updated Vital Signs BP 130/78 (BP Location: Right Arm)   Pulse 92   Temp 98.3 F (36.8 C) (Oral)   Resp 18   Ht 5\' 3"  (1.6 m)   Wt 85 kg   SpO2 98%   BMI 33.19 kg/m   Physical Exam  Constitutional: She is oriented to person, place, and time. She appears well-developed and well-nourished.  HENT:  Head: Normocephalic and atraumatic.  Eyes: Conjunctivae are normal. Pupils are equal, round, and reactive to light. Right eye exhibits no discharge. Left eye exhibits no discharge. No scleral icterus.  Neck: Normal range of motion. No JVD present. No tracheal deviation present.  Pulmonary/Chest: Effort normal. No stridor.  Abdominal: Soft. She exhibits no distension and no mass. There is no  tenderness. There is no rebound and no guarding. No hernia.  Tenderness with percussion of the right back and flank, NTTP  Neurological: She is alert and oriented to person, place, and time. Coordination normal.  Psychiatric: She has a normal mood and affect. Her behavior is normal. Judgment and thought content normal.  Nursing note and vitals reviewed.    ED Treatments / Results  Labs (all labs ordered are listed, but only abnormal results are displayed) Labs Reviewed  URINALYSIS, ROUTINE W REFLEX MICROSCOPIC (NOT AT [facility]) - Abnormal; Notable for the following:       Result Value   Color, Urine AMBER (*)    Bilirubin Urine SMALL (*)    Leukocytes, UA MODERATE (*)    All other components within normal limits  URINE MICROSCOPIC-ADD ON - Abnormal; Notable for the following:    Squamous Epithelial / LPF 0-5 (*)    Bacteria, UA RARE (*)    All other components within normal limits  BASIC METABOLIC PANEL - Abnormal; Notable for the following:    Potassium 3.4 (*)    Glucose, Bld 137 (*)    All other components within normal limits  URINE CULTURE    EKG  EKG Interpretation None       Radiology Ct Renal Stone Study  Result Date: 06/18/2016 CLINICAL DATA:  No CT findings to account for the EXAM: CT ABDOMEN AND PELVIS WITHOUT CONTRAST TECHNIQUE: Multidetector CT imaging of the abdomen and pelvis was performed following the standard protocol without IV contrast. COMPARISON:  03/13/2013 FINDINGS: Lower chest: The lung bases are clear of acute process. No pleural effusion or pulmonary lesions. The heart is normal in size. No pericardial effusion. The distal esophagus and aorta are unremarkable. Moderate-sized hiatal hernia. Hepatobiliary: No focal hepatic lesions or intrahepatic biliary dilatation. The gallbladder is normal. No common bile duct dilatation. Pancreas: No mass, inflammation or ductal dilatation. Spleen: Normal size.  No focal lesions. Adrenals/Urinary Tract: The adrenal  glands and the kidneys are unremarkable. No renal, ureteral or bladder calculi or mass. Stomach/Bowel: The stomach, duodenum, small bowel and colon are unremarkable without oral contrast. No inflammatory changes, mass lesions or obstructive findings. Masslike lesion near the ileocecal valve appears to be succus entericus entering from the terminal ileum. The terminal ileum is normal. The appendix is normal. Vascular/Lymphatic: No significant vascular findings are present. No enlarged abdominal or pelvic lymph nodes. Small periumbilical abdominal wall hernia containing fat. Reproductive: Surgically absent. Other: No pelvic mass or adenopathy. No free pelvic fluid collections. No inguinal mass or adenopathy. Musculoskeletal: No significant bony findings.  IMPRESSION: No CT findings to account the patient's right flank pain. No renal, ureteral or bladder calculi or mass. No acute abdominal/ pelvic findings, mass lesions or lymphadenopathy. Moderate-sized hiatal hernia. Electronically Signed   By: Marijo Sanes M.D.   On: 06/18/2016 16:43    Procedures Procedures (including critical care time)  Medications Ordered in ED Medications - No data to display   Initial Impression / Assessment and Plan / ED Course  I have reviewed the triage vital signs and the nursing notes.  Pertinent labs & imaging results that were available during my care of the patient were reviewed by me and considered in my medical decision making (see chart for details).  Clinical Course     Final Clinical Impressions(s) / ED Diagnoses   Final diagnoses:  Flank pain   Labs: BMP, urinalysis  Imaging: CT renal  Consults:  Therapeutics:  Discharge Meds:   Assessment/Plan:  Patient presents with right flank pain. CT scan shows no acute pathology. Chart review shows no identifiable stones on any of patient's previous CT scans, but did note blood in her urine. Although passing a stone is possible, I have low suspicion in  this patient. Patient was nontender to palpation, but had pain with percussion of the back. She was offered pain medication but declined here in the ED. I have low suspicion for intra-abdominal pathology, higher suspicion for musculoskeletal etiology. Patient's urine returned showing moderate leukocytes, but 0-5 white blood cells and rare bacteria. She has no dysuria or signs of urinary tract infection, low suspicion for UTI or pyelonephritis in this patient. Urine culture will be sent. Patient encouraged follow-up with primary care in 2 days for reevaluation, return to the emergency room immediately. Patient verbalized understanding and agreement to today's plan had no further questions concerns.     New Prescriptions New Prescriptions   No medications on file     Okey Regal, PA-C 06/18/16 1704    Courteney Lyn Thomasene Lot, MD 06/18/16 2258

## 2016-06-18 NOTE — ED Triage Notes (Signed)
Pt c/o right flank pain x 1 day , seen at [facility] and sent for eval

## 2016-06-18 NOTE — Discharge Instructions (Signed)
Please read attached information. If you experience any new or worsening signs or symptoms please return to the emergency room for evaluation. Please follow-up with your primary care provider or specialist as discussed.  °

## 2016-06-19 MED FILL — ESOMEPRAZOLE MAG DR 40 MG C: 40 | 90 days supply | Qty: 90 | Fill #2

## 2016-06-19 MED FILL — AMLODIPINE BESYLATE 10 MG T: 10 | 90 days supply | Qty: 90 | Fill #1

## 2016-06-19 MED FILL — IMIPRAMINE HCL 50 MG TABLET: 50 | 90 days supply | Qty: 90 | Fill #2

## 2016-06-20 ENCOUNTER — Other Ambulatory Visit: Payer: Self-pay

## 2016-06-20 LAB — URINE CULTURE: Culture: <10000 — AB

## 2016-06-20 MED ORDER — ATORVASTATIN CALCIUM 40 MG PO TABS: 40.0000 mg | ORAL_TABLET | Freq: Every day | ORAL | 0 refills | Status: DC

## 2016-06-20 MED ORDER — METFORMIN HCL ER 500 MG PO TB24: 1500.0000 mg | ORAL_TABLET | Freq: Every day | ORAL | 0 refills | Status: DC

## 2016-06-20 MED FILL — ATORVASTATIN 40 MG TABLET: 40 | 30 days supply | Qty: 30 | Fill #0

## 2016-06-20 MED FILL — METFORMIN HCL ER 500 MG TAB: 500 | 30 days supply | Qty: 90 | Fill #0

## 2016-07-03 ENCOUNTER — Encounter (HOSPITAL_BASED_OUTPATIENT_CLINIC_OR_DEPARTMENT_OTHER): Payer: Self-pay | Admitting: *Deleted

## 2016-07-10 ENCOUNTER — Encounter (HOSPITAL_BASED_OUTPATIENT_CLINIC_OR_DEPARTMENT_OTHER): Payer: Self-pay | Admitting: Surgery

## 2016-07-10 DIAGNOSIS — R2232 Localized swelling, mass and lump, left upper limb: Secondary | ICD-10-CM

## 2016-07-10 DIAGNOSIS — M7989 Other specified soft tissue disorders: Secondary | ICD-10-CM | POA: Diagnosis present

## 2016-07-10 NOTE — H&P (Signed)
General Surgery [facility]  [name] DOB: 03-26-1961 Married / Language: Cleophus Molt / Race: White Female  History of Present Illness  Patient words: lipoma.  The patient is a 55 year old female who presents with a soft tissue mass.  Patient is referred by Dr. Lovey Newcomer, dermatology, for evaluation of soft tissue mass on the medial aspect of the left upper arm. Patient states that this mass was first noted approximately 1 year ago. It has gradually increased in size. It does not cause any significant discomfort. She denies any history of trauma. She presents today for further evaluation and recommendations.   Other Problems Diabetes Mellitus Gastroesophageal Reflux Disease High blood pressure Hypercholesterolemia  Past Surgical History Hysterectomy (not due to cancer) - Partial Oral Surgery  Diagnostic Studies History Colonoscopy 1-5 years ago Mammogram within last year Pap Smear 1-5 years ago  Allergies Darvocet A500 *ANALGESICS - OPIOID* Nausea, Vomiting.  Medication History Esomeprazole Magnesium (40MG  Capsule DR, Oral) Active. Imipramine HCl (50MG  Tablet, Oral) Active. MetFORMIN HCl ER (500MG  Tablet ER 24HR, Oral) Active. Norvasc (10MG  Tablet, Oral) Active. Aspirin (81MG  Tablet DR, Oral) Active. Medications Reconciled  Social History  Alcohol use Occasional alcohol use. Caffeine use Carbonated beverages. No drug use Tobacco use Never smoker.  Family History  Diabetes Mellitus Mother, Sister. Heart disease in female family member before age 35 Heart disease in female family member before age 10 Hypertension Brother, Father, Mother, Sister.  Pregnancy / Birth History  Age at menarche 31 years. Gravida 0 Para 0  Review of Systems General Not Present- Appetite Loss, Chills, Fatigue, Fever, Night Sweats, Weight Gain and Weight Loss. Skin Not Present- Change in Wart/Mole, Dryness, Hives, Jaundice, New  Lesions, Non-Healing Wounds, Rash and Ulcer. HEENT Present- Wears glasses/contact lenses. Not Present- Earache, Hearing Loss, Hoarseness, Nose Bleed, Oral Ulcers, Ringing in the Ears, Seasonal Allergies, Sinus Pain, Sore Throat, Visual Disturbances and Yellow Eyes. Respiratory Present- Chronic Cough. Not Present- Bloody sputum, Difficulty Breathing, Snoring and Wheezing. Breast Not Present- Breast Mass, Breast Pain, Nipple Discharge and Skin Changes. Cardiovascular Not Present- Chest Pain, Difficulty Breathing Lying Down, Leg Cramps, Palpitations, Rapid Heart Rate, Shortness of Breath and Swelling of Extremities. Gastrointestinal Not Present- Abdominal Pain, Bloating, Bloody Stool, Change in Bowel Habits, Chronic diarrhea, Constipation, Difficulty Swallowing, Excessive gas, Gets full quickly at meals, Hemorrhoids, Indigestion, Nausea, Rectal Pain and Vomiting. Female Genitourinary Not Present- Frequency, Nocturia, Painful Urination, Pelvic Pain and Urgency. Musculoskeletal Not Present- Back Pain, Joint Pain, Joint Stiffness, Muscle Pain, Muscle Weakness and Swelling of Extremities. Neurological Not Present- Decreased Memory, Fainting, Headaches, Numbness, Seizures, Tingling, Tremor, Trouble walking and Weakness. Psychiatric Not Present- Anxiety, Bipolar, Change in Sleep Pattern, Depression, Fearful and Frequent crying. Endocrine Not Present- Cold Intolerance, Excessive Hunger, Hair Changes, Heat Intolerance, Hot flashes and New Diabetes. Hematology Not Present- Blood Thinners, Easy Bruising, Excessive bleeding, Gland problems, HIV and Persistent Infections.  Vitals Weight: 192 lb Height: 63in Body Surface Area: 1.9 m Body Mass Index: 34.01 kg/m  Temp.: 97.22F(Oral)  Pulse: 108 (Regular)  BP: 130/82 (Sitting, Left Arm, Standard)  Physical Exam The physical exam findings are as follows: Note:General - appears comfortable, no distress; not diaphorectic  HEENT - normocephalic;  sclerae clear, gaze conjugate; mucous membranes moist, dentition good; voice normal  Neck - symmetric on extension; no palpable anterior or posterior cervical adenopathy; no palpable masses in the thyroid bed  Chest - clear bilaterally without rhonchi, rales, or wheeze  Cor - regular rhythm with  normal rate; no significant murmur  Ext - non-tender without significant edema or lymphedema; at the medial aspect of the left upper arm near the lower aspect of the biceps muscle as a soft tissue mass measuring 3 x 2 x 2 cm; this is relatively soft and relatively mobile and nontender; it is difficult to ascertain whether this is related to the biceps muscle or biceps tendon or not  Neuro - grossly intact; no tremor   Assessment & Plan   MASS OF SOFT TISSUE OF LEFT UPPER EXTREMITY (R22.32)  Follow Up - Call [facility] office after tests / studies done to discuss further plans  Patient has a soft tissue mass at the lower medial aspect of the left upper arm. This may represent an enlarging benign lipoma although it is somewhat more firm and somewhat more fixed than a typical lipoma. I want to make sure that this is not related to the underlying biceps muscle or tendon. I am going to obtain an ultrasound examination to see if that confirms findings consistent with lipoma. If so, then I think we can excise this as an outpatient surgical procedure. If this appears to be related to the underlying tendon or muscle, then I will likely make referral to Dr. Laurelyn Sickle at [facility] for further evaluation and recommendations. The patient has been seen previously and that practice and is in agreement with this plan.  She'll undergo ultrasound exam of the left upper arm. We will contact her with those results and then make a decision about how to proceed.  Earnstine Regal, MD, [facility] Office: [phone]

## 2016-07-11 ENCOUNTER — Encounter (HOSPITAL_BASED_OUTPATIENT_CLINIC_OR_DEPARTMENT_OTHER)
Admission: RE | Disposition: A | Discharge status: Home / Self Care | Payer: Self-pay | Source: Ambulatory Visit | Attending: Surgery

## 2016-07-11 ENCOUNTER — Encounter (HOSPITAL_BASED_OUTPATIENT_CLINIC_OR_DEPARTMENT_OTHER): Payer: Self-pay | Admitting: Anesthesiology

## 2016-07-11 ENCOUNTER — Ambulatory Visit (HOSPITAL_BASED_OUTPATIENT_CLINIC_OR_DEPARTMENT_OTHER): Payer: 59 | Admitting: Anesthesiology

## 2016-07-11 ENCOUNTER — Ambulatory Visit (HOSPITAL_BASED_OUTPATIENT_CLINIC_OR_DEPARTMENT_OTHER)
Admission: RE | Admit: 2016-07-11 | Discharge: 2016-07-11 | Disposition: A | Discharge status: Home / Self Care | Payer: 59 | Source: Ambulatory Visit | Attending: Surgery | Admitting: Surgery

## 2016-07-11 DIAGNOSIS — R2232 Localized swelling, mass and lump, left upper limb: Secondary | ICD-10-CM

## 2016-07-11 DIAGNOSIS — Z7984 Long term (current) use of oral hypoglycemic drugs: Secondary | ICD-10-CM | POA: Diagnosis not present

## 2016-07-11 DIAGNOSIS — I10 Essential (primary) hypertension: Secondary | ICD-10-CM | POA: Diagnosis not present

## 2016-07-11 DIAGNOSIS — E119 Type 2 diabetes mellitus without complications: Secondary | ICD-10-CM | POA: Diagnosis not present

## 2016-07-11 DIAGNOSIS — Z7982 Long term (current) use of aspirin: Secondary | ICD-10-CM | POA: Insufficient documentation

## 2016-07-11 DIAGNOSIS — R739 Hyperglycemia, unspecified: Secondary | ICD-10-CM | POA: Diagnosis not present

## 2016-07-11 DIAGNOSIS — Z79899 Other long term (current) drug therapy: Secondary | ICD-10-CM | POA: Insufficient documentation

## 2016-07-11 DIAGNOSIS — K219 Gastro-esophageal reflux disease without esophagitis: Secondary | ICD-10-CM | POA: Insufficient documentation

## 2016-07-11 DIAGNOSIS — D1722 Benign lipomatous neoplasm of skin and subcutaneous tissue of left arm: Secondary | ICD-10-CM | POA: Insufficient documentation

## 2016-07-11 DIAGNOSIS — M7989 Other specified soft tissue disorders: Secondary | ICD-10-CM | POA: Diagnosis not present

## 2016-07-11 DIAGNOSIS — E7800 Pure hypercholesterolemia, unspecified: Secondary | ICD-10-CM | POA: Diagnosis not present

## 2016-07-11 DIAGNOSIS — E785 Hyperlipidemia, unspecified: Secondary | ICD-10-CM | POA: Diagnosis not present

## 2016-07-11 HISTORY — DX: Personal history of other diseases of the digestive system: Z87.19

## 2016-07-11 HISTORY — DX: Myoneural disorder, unspecified: G70.9

## 2016-07-11 HISTORY — PX: MASS EXCISION: SHX2000

## 2016-07-11 HISTORY — DX: Type 2 diabetes mellitus without complications: E11.9

## 2016-07-11 HISTORY — DX: Personal history of urinary calculi: Z87.442

## 2016-07-11 LAB — GLUCOSE, CAPILLARY
GLUCOSE-CAPILLARY: 137 mg/dL — AB (ref 65–99)
GLUCOSE-CAPILLARY: 119 mg/dL — AB (ref 65–99)

## 2016-07-11 SURGERY — EXCISION MASS
Anesthesia: General | Site: Arm Upper | Laterality: Left

## 2016-07-11 MED ORDER — FENTANYL CITRATE (PF) 100 MCG/2ML IJ SOLN
INTRAMUSCULAR | Status: AC
  Filled 2016-07-11: qty 2

## 2016-07-11 MED ORDER — CHLORHEXIDINE GLUCONATE CLOTH 2 % EX PADS: 6.0000 | MEDICATED_PAD | Freq: Once | CUTANEOUS | Status: DC

## 2016-07-11 MED ORDER — LIDOCAINE HCL (CARDIAC) 20 MG/ML IV SOLN
INTRAVENOUS | Status: DC | PRN
Start: 2016-07-11 — End: 2016-07-11
  Administered 2016-07-11: 10 mg via INTRAVENOUS
  Administered 2016-07-11: 50 mg via INTRAVENOUS

## 2016-07-11 MED ORDER — PROPOFOL 10 MG/ML IV BOLUS
INTRAVENOUS | Status: AC
  Filled 2016-07-11: qty 20

## 2016-07-11 MED ORDER — CEFAZOLIN SODIUM-DEXTROSE 2-4 GM/100ML-% IV SOLN
INTRAVENOUS | Status: AC
  Filled 2016-07-11: qty 100

## 2016-07-11 MED ORDER — HYDROMORPHONE HCL 1 MG/ML IJ SOLN
0.2500 mg | INTRAMUSCULAR | Status: DC | PRN
  Administered 2016-07-11: 0.5 mg via INTRAVENOUS

## 2016-07-11 MED ORDER — DEXAMETHASONE SODIUM PHOSPHATE 4 MG/ML IJ SOLN
INTRAMUSCULAR | Status: DC | PRN
Start: 2016-07-11 — End: 2016-07-11
  Administered 2016-07-11: 10 mg via INTRAVENOUS

## 2016-07-11 MED ORDER — MIDAZOLAM HCL 2 MG/2ML IJ SOLN
INTRAMUSCULAR | Status: AC
  Filled 2016-07-11: qty 2

## 2016-07-11 MED ORDER — PROPOFOL 10 MG/ML IV BOLUS
INTRAVENOUS | Status: DC | PRN
  Administered 2016-07-11: 150 mg via INTRAVENOUS

## 2016-07-11 MED ORDER — LACTATED RINGERS IV SOLN
INTRAVENOUS | Status: DC
  Administered 2016-07-11 (×2): via INTRAVENOUS

## 2016-07-11 MED ORDER — MIDAZOLAM HCL 2 MG/2ML IJ SOLN
1.0000 mg | INTRAMUSCULAR | Status: DC | PRN
  Administered 2016-07-11: 2 mg via INTRAVENOUS

## 2016-07-11 MED ORDER — CEFAZOLIN SODIUM-DEXTROSE 2-4 GM/100ML-% IV SOLN
2.0000 g | INTRAVENOUS | Status: AC
  Administered 2016-07-11: 2 g via INTRAVENOUS

## 2016-07-11 MED ORDER — HYDROCODONE-ACETAMINOPHEN 5-325 MG PO TABS: 1.0000 | ORAL_TABLET | ORAL | 0 refills | Status: DC | PRN

## 2016-07-11 MED ORDER — SCOPOLAMINE 1 MG/3DAYS TD PT72: 1.0000 | MEDICATED_PATCH | Freq: Once | TRANSDERMAL | Status: DC | PRN

## 2016-07-11 MED ORDER — HYDROMORPHONE HCL 1 MG/ML IJ SOLN
INTRAMUSCULAR | Status: AC
  Filled 2016-07-11: qty 1

## 2016-07-11 MED ORDER — BUPIVACAINE HCL 0.25 % IJ SOLN
INTRAMUSCULAR | Status: DC | PRN
  Administered 2016-07-11: 5 mL

## 2016-07-11 MED ORDER — FENTANYL CITRATE (PF) 100 MCG/2ML IJ SOLN
50.0000 ug | INTRAMUSCULAR | Status: DC | PRN
  Administered 2016-07-11: 100 ug via INTRAVENOUS

## 2016-07-11 MED ORDER — ONDANSETRON HCL 4 MG/2ML IJ SOLN
INTRAMUSCULAR | Status: DC | PRN
  Administered 2016-07-11: 4 mg via INTRAVENOUS

## 2016-07-11 MED ORDER — GLYCOPYRROLATE 0.2 MG/ML IJ SOLN
0.2000 mg | Freq: Once | INTRAMUSCULAR | Status: DC | PRN
Start: 2016-07-11 — End: 2016-07-11

## 2016-07-11 SURGICAL SUPPLY — 41 items
APL SKNCLS STERI-STRIP NONHPOA (GAUZE/BANDAGES/DRESSINGS)
BANDAGE ACE 4X5 VEL STRL LF (GAUZE/BANDAGES/DRESSINGS) ×1 IMPLANT
BENZOIN TINCTURE PRP APPL 2/3 (GAUZE/BANDAGES/DRESSINGS) IMPLANT
BLADE SURG 15 STRL LF DISP TIS (BLADE) ×1 IMPLANT
BLADE SURG 15 STRL SS (BLADE) ×2
CHLORAPREP W/TINT 26ML (MISCELLANEOUS) ×2 IMPLANT
CLEANER CAUTERY TIP 5X5 PAD (MISCELLANEOUS) IMPLANT
COVER BACK TABLE 60X90IN (DRAPES) ×2 IMPLANT
COVER MAYO STAND STRL (DRAPES) ×2 IMPLANT
DECANTER SPIKE VIAL GLASS SM (MISCELLANEOUS) IMPLANT
DRAPE LAPAROTOMY 100X72 PEDS (DRAPES) IMPLANT
DRAPE U-SHAPE 76X120 STRL (DRAPES) ×1 IMPLANT
DRAPE UTILITY XL STRL (DRAPES) ×2 IMPLANT
DRSG TEGADERM 4X4.75 (GAUZE/BANDAGES/DRESSINGS) IMPLANT
ELECT REM PT RETURN 9FT ADLT (ELECTROSURGICAL) ×2
ELECTRODE REM PT RTRN 9FT ADLT (ELECTROSURGICAL) ×1 IMPLANT
GLOVE BIOGEL PI IND STRL 7.5 (GLOVE) IMPLANT
GLOVE BIOGEL PI INDICATOR 7.5 (GLOVE) ×1
GLOVE SURG ORTHO 8.0 STRL STRW (GLOVE) ×2 IMPLANT
GLOVE SURG SS PI 7.0 STRL IVOR (GLOVE) ×1 IMPLANT
GOWN STRL REUS W/ TWL LRG LVL3 (GOWN DISPOSABLE) ×1 IMPLANT
GOWN STRL REUS W/ TWL XL LVL3 (GOWN DISPOSABLE) ×1 IMPLANT
GOWN STRL REUS W/TWL LRG LVL3 (GOWN DISPOSABLE) ×2
GOWN STRL REUS W/TWL XL LVL3 (GOWN DISPOSABLE) ×2
NDL HYPO 25X1 1.5 SAFETY (NEEDLE) ×1 IMPLANT
NEEDLE HYPO 25X1 1.5 SAFETY (NEEDLE) ×2 IMPLANT
PACK BASIN DAY SURGERY FS (CUSTOM PROCEDURE TRAY) ×2 IMPLANT
PAD CLEANER CAUTERY TIP 5X5 (MISCELLANEOUS)
PENCIL BUTTON HOLSTER BLD 10FT (ELECTRODE) ×2 IMPLANT
SHEET MEDIUM DRAPE 40X70 STRL (DRAPES) IMPLANT
SLEEVE SCD COMPRESS KNEE MED (MISCELLANEOUS) ×1 IMPLANT
SPONGE GAUZE 4X4 12PLY STER LF (GAUZE/BANDAGES/DRESSINGS) ×2 IMPLANT
STRIP CLOSURE SKIN 1/2X4 (GAUZE/BANDAGES/DRESSINGS) ×1 IMPLANT
SUT ETHILON 3 0 PS 1 (SUTURE) ×1 IMPLANT
SUT ETHILON 4 0 PS 2 18 (SUTURE) IMPLANT
SUT MNCRL AB 4-0 PS2 18 (SUTURE) ×1 IMPLANT
SUT VICRYL 3-0 CR8 SH (SUTURE) ×2 IMPLANT
SUT VICRYL 4-0 PS2 18IN ABS (SUTURE) IMPLANT
SYR CONTROL 10ML LL (SYRINGE) ×2 IMPLANT
TOWEL OR 17X24 6PK STRL BLUE (TOWEL DISPOSABLE) ×4 IMPLANT
TOWEL OR NON WOVEN STRL DISP B (DISPOSABLE) ×2 IMPLANT

## 2016-07-11 NOTE — Anesthesia Procedure Notes (Signed)
Procedure Name: LMA Insertion Date/Time: 07/11/2016 11:22 AM Performed by: Melynda Ripple D Pre-anesthesia Checklist: Patient identified, Emergency Drugs available, Suction available and Patient being monitored Patient Re-evaluated:Patient Re-evaluated prior to inductionOxygen Delivery Method: Circle system utilized Preoxygenation: Pre-oxygenation with 100% oxygen Intubation Type: IV induction Ventilation: Mask ventilation without difficulty LMA: LMA inserted LMA Size: 4.0 Number of attempts: 1 Airway Equipment and Method: Bite block Placement Confirmation: positive ETCO2 Tube secured with: Tape Dental Injury: Teeth and Oropharynx as per pre-operative assessment

## 2016-07-11 NOTE — Brief Op Note (Signed)
07/11/2016  11:57 AM  PATIENT:  Rosalie Gums  55 y.o. female  PRE-OPERATIVE DIAGNOSIS:  soft tissue mass left upper arm  POST-OPERATIVE DIAGNOSIS:  soft tissue mass left upper arm  PROCEDURE:  Procedure(s): EXCISION MASS LEFT UPPER ARM (Left)  SURGEON:  Surgeon(s) and Role:    * Armandina Gemma, MD - Primary  PHYSICIAN ASSISTANT:   ASSISTANTS: none   ANESTHESIA:   general  EBL:  Total I/O In: -  Out: 5 [Blood:5]  BLOOD ADMINISTERED:none  DRAINS: none   LOCAL MEDICATIONS USED:  MARCAINE     SPECIMEN:  Excision  DISPOSITION OF SPECIMEN:  PATHOLOGY  COUNTS:  YES  TOURNIQUET:  * No tourniquets in log *  DICTATION: .Other Dictation: Dictation Number [id number]  PLAN OF CARE: Discharge to home after PACU  PATIENT DISPOSITION:  PACU - hemodynamically stable.   Delay start of Pharmacological VTE agent (>24hrs) due to surgical blood loss or risk of bleeding: yes  Earnstine Regal, MD, [facility] Office: [phone]

## 2016-07-11 NOTE — Discharge Instructions (Signed)

## 2016-07-11 NOTE — Anesthesia Preprocedure Evaluation (Signed)
Anesthesia Evaluation  Patient identified by MRN, date of birth, ID band Patient awake    Reviewed: Allergy & Precautions, H&P , NPO status , Patient's Chart, lab work & pertinent test results  Airway Mallampati: II  TM Distance: >3 FB Neck ROM: Full    Dental no notable dental hx. (+) Teeth Intact, Dental Advisory Given   Pulmonary neg pulmonary ROS,    Pulmonary exam normal breath sounds clear to auscultation       Cardiovascular hypertension, Pt. on medications  Rhythm:Regular Rate:Normal     Neuro/Psych negative neurological ROS  negative psych ROS   GI/Hepatic Neg liver ROS, GERD  Medicated and Controlled,  Endo/Other  diabetes, Type 2, Oral Hypoglycemic Agents  Renal/GU negative Renal ROS  negative genitourinary   Musculoskeletal   Abdominal   Peds  Hematology negative hematology ROS (+)   Anesthesia Other Findings   Reproductive/Obstetrics negative OB ROS                           Anesthesia Physical Anesthesia Plan  ASA: II  Anesthesia Plan: General   Post-op Pain Management:    Induction: Intravenous  Airway Management Planned: LMA  Additional Equipment:   Intra-op Plan:   Post-operative Plan: Extubation in OR  Informed Consent: I have reviewed the patients History and Physical, chart, labs and discussed the procedure including the risks, benefits and alternatives for the proposed anesthesia with the patient or authorized representative who has indicated his/her understanding and acceptance.   Dental advisory given  Plan Discussed with: CRNA  Anesthesia Plan Comments:         Anesthesia Quick Evaluation  

## 2016-07-11 NOTE — Anesthesia Postprocedure Evaluation (Signed)
Anesthesia Post Note  Patient: [name]  Procedure(s) Performed: Procedure(s) (LRB): EXCISION MASS LEFT UPPER ARM (Left)  Patient location during evaluation: PACU Anesthesia Type: General Level of consciousness: awake and alert Pain management: pain level controlled Vital Signs Assessment: post-procedure vital signs reviewed and stable Respiratory status: spontaneous breathing, nonlabored ventilation and respiratory function stable Cardiovascular status: blood pressure returned to baseline and stable Postop Assessment: no signs of nausea or vomiting Anesthetic complications: no    Last Vitals:  Vitals:   07/11/16 1245 07/11/16 1306  BP: (!) 142/82 (!) 150/85  Pulse: 97 (!) 104  Resp: 13 20  Temp:  36.4 C    Last Pain:  Vitals:   07/11/16 1306  TempSrc:   PainSc: 0-No pain                 Jacquelyn Shadrick,W. EDMOND

## 2016-07-11 NOTE — Transfer of Care (Signed)
Immediate Anesthesia Transfer of Care Note  Patient: [name]  Procedure(s) Performed: Procedure(s): EXCISION MASS LEFT UPPER ARM (Left)  Patient Location: PACU  Anesthesia Type:General  Level of Consciousness: sedated  Airway & Oxygen Therapy: Patient Spontanous Breathing and Patient connected to face mask oxygen  Post-op Assessment: Report given to RN and Post -op Vital signs reviewed and stable  Post vital signs: Reviewed and stable  Last Vitals:  Vitals:   07/11/16 1007 07/11/16 1200  BP: (!) 163/82 134/81  Pulse: (!) 106 93  Resp: 18 12  Temp: 36.8 C (P) 36.5 C    Last Pain:  Vitals:   07/11/16 1007  TempSrc: Oral         Complications: No apparent anesthesia complications

## 2016-07-11 NOTE — Interval H&P Note (Signed)
History and Physical Interval Note:  07/11/2016 11:04 AM  [name]  has presented today for surgery, with the diagnosis of soft tissue mass left upper arm  The various methods of treatment have been discussed with the patient and family. After consideration of risks, benefits and other options for treatment, the patient has consented to    Procedure(s): EXCISION MASS LEFT UPPER ARM (Left) as a surgical intervention .    The patient's history has been reviewed, patient examined, no change in status, stable for surgery.  I have reviewed the patient's chart and labs.  Questions were answered to the patient's satisfaction.    Earnstine Regal, MD, [facility] Office: [place]

## 2016-07-14 NOTE — Op Note (Signed)
[name], [name]                 ACCOUNT NO.:  [id number]  MEDICAL RECORD NO.:  [id number]  LOCATION:                                 FACILITY:  PHYSICIAN:  Earnstine Regal, MD      DATE OF BIRTH:  1961-07-20  DATE OF PROCEDURE:  07/11/2016                              OPERATIVE REPORT   PREOPERATIVE DIAGNOSIS:  Soft tissue mass, left upper extremity.  POSTOPERATIVE DIAGNOSIS:  Soft tissue mass, left upper extremity.  PROCEDURE:  Excision of soft tissue mass, left upper extremity (4.0 x 3.5 x 1.5 cm).  SURGEON:  Earnstine Regal, MD  ANESTHESIA:  General.  ESTIMATED BLOOD LOSS:  Minimal.  PREPARATION:  ChloraPrep.  COMPLICATIONS:  None.  INDICATIONS:  The patient is a 55 year old female referred by Dr. Lovey Newcomer. from Dermatology for evaluation of soft tissue mass involving the medial aspect of the left upper arm.  The patient had first noted a mass approximately 1 year ago.  It has gradually increased in size.  The patient underwent ultrasound examination, which was largely unrevealing. The patient notes that the mass has increased in size over the past few weeks.  She now comes to Surgery for surgical excision for definitive diagnosis and management.  BODY OF REPORT:  Procedure was done in OR #2 at the [facility].  The patient was brought to the operating room and placed in supine position on the operating room table.  Following administration of general anesthesia, the left arm was placed on an armboard, positioned, and then prepped and draped in the usual aseptic fashion. After ascertaining that an adequate level of anesthesia had been achieved, the skin over the mass was anesthetized with local anesthetic. A 2.5-cm incision was made with a #15 blade.  Dissection was carried into the subcutaneous tissues.  Using the electrocautery for hemostasis, a soft tissue mass was excised from the subcutaneous space.  It extends down to the underlying biceps  muscle.  Mass was excised in its entirety. It measures 4.0 x 3.5 x 1.5 cm and looks like a multilobulated lipoma. It was submitted in its entirety to Pathology for review.  Good hemostasis was obtained throughout the wound.  Subcutaneous tissues were closed with interrupted 3-0 Vicryl sutures.  Skin was closed with a running 4-0 Monocryl subcuticular suture.  Wound was washed and dried, and Steri-Strips were applied.  Sterile dressing was applied.  The arm was then wrapped with an Ace wrap.  The patient was awakened from anesthesia and brought to the recovery room.  The patient tolerated the procedure well.   Earnstine Regal, MD, [facility] Office: [phone]   TMG/MEDQ  D:  07/11/2016  T:  07/12/2016  Job:  [id number]  cc:   Michelene Gardener., M.D.

## 2016-07-15 NOTE — Progress Notes (Signed)
Please contact patient and notify of benign pathology results.  Faithann Natal M. Jayr Lupercio, MD, FACS [facility] Office: [phone]   

## 2016-07-17 ENCOUNTER — Encounter (HOSPITAL_BASED_OUTPATIENT_CLINIC_OR_DEPARTMENT_OTHER): Payer: Self-pay | Admitting: Surgery

## 2016-07-18 MED FILL — HYDROCODON-APAP 5-325: 5-325 | 3 days supply | Qty: 20 | Fill #0

## 2016-12-01 DIAGNOSIS — H6980 Other specified disorders of Eustachian tube, unspecified ear: Secondary | ICD-10-CM | POA: Diagnosis not present

## 2017-03-09 ENCOUNTER — Telehealth: Payer: Self-pay

## 2017-03-09 ENCOUNTER — Encounter: Payer: Self-pay | Admitting: Family Medicine

## 2017-03-09 ENCOUNTER — Ambulatory Visit (INDEPENDENT_AMBULATORY_CARE_PROVIDER_SITE_OTHER): Payer: 59 | Admitting: Family Medicine

## 2017-03-09 VITALS — BP 147/80 | HR 108 | Temp 98.7°F | Resp 18 | Ht 63.0 in | Wt 189.0 lb

## 2017-03-09 DIAGNOSIS — Z5181 Encounter for therapeutic drug level monitoring: Secondary | ICD-10-CM

## 2017-03-09 DIAGNOSIS — M792 Neuralgia and neuritis, unspecified: Secondary | ICD-10-CM

## 2017-03-09 DIAGNOSIS — E559 Vitamin D deficiency, unspecified: Secondary | ICD-10-CM

## 2017-03-09 DIAGNOSIS — E119 Type 2 diabetes mellitus without complications: Secondary | ICD-10-CM | POA: Diagnosis not present

## 2017-03-09 DIAGNOSIS — I10 Essential (primary) hypertension: Secondary | ICD-10-CM

## 2017-03-09 DIAGNOSIS — E785 Hyperlipidemia, unspecified: Secondary | ICD-10-CM

## 2017-03-09 DIAGNOSIS — Z0000 Encounter for general adult medical examination without abnormal findings: Secondary | ICD-10-CM

## 2017-03-09 DIAGNOSIS — K219 Gastro-esophageal reflux disease without esophagitis: Secondary | ICD-10-CM

## 2017-03-09 LAB — POCT GLYCOSYLATED HEMOGLOBIN (HGB A1C): Hemoglobin A1C: 6.6

## 2017-03-09 MED ORDER — ESOMEPRAZOLE MAGNESIUM 40 MG PO CPDR: 40.0000 mg | DELAYED_RELEASE_CAPSULE | Freq: Every day | ORAL | 3 refills | Status: DC

## 2017-03-09 MED ORDER — ATORVASTATIN CALCIUM 40 MG PO TABS: 40.0000 mg | ORAL_TABLET | Freq: Every day | ORAL | 3 refills | Status: DC

## 2017-03-09 MED ORDER — AMLODIPINE BESYLATE 10 MG PO TABS: 10.0000 mg | ORAL_TABLET | Freq: Every day | ORAL | 3 refills | Status: DC

## 2017-03-09 MED ORDER — METFORMIN HCL ER 500 MG PO TB24: 1500.0000 mg | ORAL_TABLET | Freq: Every day | ORAL | 3 refills | Status: DC

## 2017-03-09 MED ORDER — IMIPRAMINE HCL 50 MG PO TABS: 50.0000 mg | ORAL_TABLET | Freq: Every day | ORAL | 3 refills | Status: DC

## 2017-03-09 MED ORDER — DULAGLUTIDE 0.75 MG/0.5ML ~~LOC~~ SOAJ: 1.0000 | SUBCUTANEOUS | 5 refills | Status: DC

## 2017-03-09 MED FILL — IMIPRAMINE HCL 50 MG TABLET: 50 | 90 days supply | Qty: 90 | Fill #0

## 2017-03-09 MED FILL — ESOMEPRAZOLE MAG DR 40 MG C: 40 | 90 days supply | Qty: 90 | Fill #0

## 2017-03-09 MED FILL — METFORMIN HCL ER 500 MG TAB: 500 | 90 days supply | Qty: 270 | Fill #0

## 2017-03-09 MED FILL — ATORVASTATIN 40 MG TABLET: 40 | 90 days supply | Qty: 90 | Fill #0

## 2017-03-09 MED FILL — AMLODIPINE BESYLATE 10 MG T: 10 | 90 days supply | Qty: 90 | Fill #0

## 2017-03-09 NOTE — Progress Notes (Signed)
Subjective:    Patient ID: [name], female    DOB: 10-09-1960, 56 y.o.   MRN: [id number] Chief Complaint  Patient presents with  . Medication Refill    all medications     HPI  Lab Results  Component Value Date   HGBA1C 6.5 11/15/2015   HGBA1C 6.0 10/27/2014   HGBA1C 6.1 08/26/2013   DM:  Trying to do diet herself and is not doing well. She is interested in medication for appetite suppression. No stomach problems with metformin.   Her ob-gyn Dr. Patrecia Pace prescribed her Minette [place]land and it made her feel like her tongue felt restless - was moving unintentionally.  No prior wellbutrin use.  Working all the time and, has new cat so has joy outside of work. No exercise though.  Depression screen [facility] 2/9 03/09/2017 11/15/2015 10/27/2014  Decreased Interest 0 0 0  Down, Depressed, Hopeless 0 0 0  PHQ - 2 Score 0 0 0    HTN: Did not take norvasc this a.m.  HLD: Taking lipitor every 2-3 nights.   Imipramine keeps left flank pain away.  Past Medical History:  Diagnosis Date  . Diabetes mellitus without complication ([place])   . Endometriosis   . Fibroids    uterine  . GERD (gastroesophageal reflux disease)   . History of hiatal hernia   . History of kidney stones 2014, 2017  . Hyperlipidemia   . Hypertension   . 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 Obesity (BMI 30-39.9)   . Peptic stricture of esophagus   . Uterine fibroid    Past Surgical History:  Procedure Laterality Date  . ABDOMINAL HYSTERECTOMY  2008  . BREAST CYST EXCISION  2007  . EXPLORATORY LAPAROTOMY  ~2005  . MASS EXCISION Left 07/11/2016   Procedure: EXCISION MASS LEFT UPPER ARM;  Surgeon: Armandina Gemma, MD;  Location: [facility];  Service: General;  Laterality: Left;   Current Outpatient Prescriptions on File Prior to Visit  Medication Sig Dispense Refill  . aspirin 81 MG tablet Take 81 mg by mouth daily.      . Calcium-Vitamin D (CALTRATE 600 PLUS-VIT D PO) Take 1  tablet by mouth 2 (two) times daily.      Marland Kitchen glucose blood test strip Use to check blood sugar daily. 100 each 3  . Lancets MISC USE TO CHECK BLOOD SUGAR DAILY. 100 each 3   No current facility-administered medications on file prior to visit.    Allergies  Allergen Reactions  . Darvocet [Propoxyphene N-Acetaminophen] Nausea Only  . Lisinopril Cough  . Losartan Cough   Family History  Problem Relation Age of Onset  . Diabetes Mother   . Hypertension Father   . Heart attack Father   . Heart disease Sister   . Diabetes Sister   . Heart disease Brother   . Diabetes Brother   . Hyperlipidemia Brother   . Stroke Brother   . Colon cancer Neg Hx    Social History   Social History  . Marital status: Married    Spouse name: N/A  . Number of children: N/A  . Years of education: N/A   Occupational History  . Pharmacy Tech     Social History Main Topics  . Smoking status: Never Smoker  . Smokeless tobacco: Never Used  . Alcohol use 0.6 oz/week    1 Glasses of wine per week     Comment: 1-2 per week  .  Drug use: No  . Sexual activity: Yes   Other Topics Concern  . None   Social History Narrative   2 caffeine drinks daily    Depression screen [facility] 2/9 03/09/2017 11/15/2015 10/27/2014  Decreased Interest 0 0 0  Down, Depressed, Hopeless 0 0 0  PHQ - 2 Score 0 0 0    Review of Systems See hpi    Objective:   Physical Exam  Constitutional: She is oriented to person, place, and time. She appears well-developed and well-nourished. No distress.  HENT:  Head: Normocephalic and atraumatic.  Right Ear: External ear normal.  Left Ear: External ear normal.  Eyes: Conjunctivae are normal. No scleral icterus.  Neck: Normal range of motion. Neck supple. No thyromegaly present.  Cardiovascular: Normal rate, regular rhythm, normal heart sounds and intact distal pulses.   Pulmonary/Chest: Effort normal and breath sounds normal. No respiratory distress.  Musculoskeletal: She exhibits  no edema.  Lymphadenopathy:    She has no cervical adenopathy.  Neurological: She is alert and oriented to person, place, and time.  Skin: Skin is warm and dry. She is not diaphoretic. No erythema.  Psychiatric: She has a normal mood and affect. Her behavior is normal.      BP (!) 147/80   Pulse (!) 108   Temp 98.7 F (37.1 C) (Oral)   Resp 18   Ht 5\' 3"  (1.6 m)   Wt 189 lb (85.7 kg)   SpO2 97%   BMI 33.48 kg/m   Diabetic Foot Exam - Simple   Simple Foot Form Visual Inspection No deformities, no ulcerations, no other skin breakdown bilaterally:  Yes Sensation Testing Intact to touch and monofilament testing bilaterally:  Yes Pulse Check Posterior Tibialis and Dorsalis pulse intact bilaterally:  Yes Comments    Results for orders placed or performed in visit on 03/09/17  POCT glycosylated hemoglobin (Hb A1C)  Result Value Ref Range   Hemoglobin A1C 6.6      Assessment & Plan:   1. Type 2 diabetes mellitus without complication, without long-term current use of insulin (HCC) - cont metformin XR 1500mg  qam. Cont to gradually increase over time despite prior working with health coach through employer program. a1c 6.6 but pt also disappointed in weight so will do trial of adding in trulicity. If hypoglycemic, decrease metformin dose by 500mg . Recheck in 4 mos.  Cont asa  2. Hyperlipidemia LDL goal <100 - admits to poor lipitor compliance qhs so change to morning dosing.  3. Essential hypertension - cont amlodipine 10, intolerance of lisinopril and losartan due to cough.   4. Medication monitoring encounter   5. Gastroesophageal reflux disease, esophagitis presence not specified - dependent upon nexium. Had esophageal strictutres so stays on nexium but gets upper chest sxs if skips ppi for a few days  6. Neuropathic pain - left flank, imipramine working well  7.      Vitamin D def - start once wk high dose replacement - was 18 last yr and did not get replaced.  Is not immune  to Hep B or Rubella - discuss poss revaccination at f/u.  Orders Placed This Encounter  Procedures  . Comprehensive metabolic panel    Order Specific Question:   Has the patient fasted?    Answer:   Yes  . Lipid panel    Order Specific Question:   Has the patient fasted?    Answer:   Yes  . Microalbumin/Creatinine Ratio, Urine  . Care order/instruction:  Scheduling Instructions:     Recheck BP and pulse  . POCT glycosylated hemoglobin (Hb A1C)  . HM DIABETES FOOT EXAM    Meds ordered this encounter  Medications  . Dulaglutide (TRULICITY) 8.02 MV/3.6PQ SOPN    Sig: Inject 1 applicator into the skin once a week.    Dispense:  4 pen    Refill:  5  . metFORMIN (GLUCOPHAGE XR) 500 MG 24 hr tablet    Sig: Take 3 tablets (1,500 mg total) by mouth daily with breakfast.    Dispense:  270 tablet    Refill:  3  . atorvastatin (LIPITOR) 40 MG tablet    Sig: Take 1 tablet (40 mg total) by mouth daily.    Dispense:  90 tablet    Refill:  3  . amLODipine (NORVASC) 10 MG tablet    Sig: Take 1 tablet (10 mg total) by mouth daily.    Dispense:  90 tablet    Refill:  3  . esomeprazole (NEXIUM) 40 MG capsule    Sig: Take 1 capsule (40 mg total) by mouth at bedtime.    Dispense:  90 capsule    Refill:  3  . imipramine (TOFRANIL) 50 MG tablet    Sig: Take 1 tablet (50 mg total) by mouth at bedtime.    Dispense:  90 tablet    Refill:  3      Delman Cheadle, M.D.  Primary Care at [facility] [address] [place], [place] [address] [phone] phone [phone] fax  03/10/17 10:40 PM

## 2017-03-09 NOTE — Patient Instructions (Addendum)
Change to morning dosing on the lisinopril. Start the trulicity. If you feel like you are getting lows decrease the metformin    IF you received an x-ray today, you will receive an invoice from [facility]. Please contact [facility] at [phone] with questions or concerns regarding your invoice.   IF you received labwork today, you will receive an invoice from [facility]. Please contact [facility] at [phone] with questions or concerns regarding your invoice.   Our billing staff will not be able to assist you with questions regarding bills from these companies.  You will be contacted with the lab results as soon as they are available. The fastest way to get your results is to activate your My Chart account. Instructions are located on the last page of this paperwork. If you have not heard from [place] regarding the results in 2 weeks, please contact this office.

## 2017-03-09 NOTE — Telephone Encounter (Signed)
Trulicity Rejected, not a covered drug  PA request sent to insurance Awaiting call, takes usually 1 business day  Additional Message: Trial of metformin (IR/ER0, a sulfonylurea, pioglitazone, or a combination product containing any two of the three aforementioned agents required within the past 120 days. (Authorization [id number])

## 2017-03-09 NOTE — Telephone Encounter (Signed)
Pt did have trial of metformin IR and ER within the past 120 d- and is still on, needs additional medication.

## 2017-03-10 ENCOUNTER — Encounter: Payer: Self-pay | Admitting: Family Medicine

## 2017-03-10 DIAGNOSIS — K449 Diaphragmatic hernia without obstruction or gangrene: Secondary | ICD-10-CM

## 2017-03-10 DIAGNOSIS — M792 Neuralgia and neuritis, unspecified: Secondary | ICD-10-CM | POA: Insufficient documentation

## 2017-03-10 DIAGNOSIS — E559 Vitamin D deficiency, unspecified: Secondary | ICD-10-CM | POA: Insufficient documentation

## 2017-03-10 DIAGNOSIS — K219 Gastro-esophageal reflux disease without esophagitis: Secondary | ICD-10-CM | POA: Insufficient documentation

## 2017-03-10 LAB — COMPREHENSIVE METABOLIC PANEL
ALBUMIN: 4.6 g/dL (ref 3.5–5.5)
ALK PHOS: 152 IU/L — AB (ref 39–117)
ALT: 34 IU/L — ABNORMAL HIGH (ref 0–32)
AST: 23 IU/L (ref 0–40)
Albumin/Globulin Ratio: 1.4 (ref 1.2–2.2)
BUN / CREAT RATIO: 18 (ref 9–23)
BUN: 13 mg/dL (ref 6–24)
Bilirubin Total: 0.5 mg/dL (ref 0.0–1.2)
CALCIUM: 9.6 mg/dL (ref 8.7–10.2)
CO2: 22 mmol/L (ref 20–29)
CREATININE: 0.71 mg/dL (ref 0.57–1.00)
Chloride: 101 mmol/L (ref 96–106)
GFR calc Af Amer: 111 mL/min/{1.73_m2} (ref >59)
GFR, EST NON AFRICAN AMERICAN: 96 mL/min/{1.73_m2} (ref >59)
GLUCOSE: 133 mg/dL — AB (ref 65–99)
Globulin, Total: 3.3 g/dL (ref 1.5–4.5)
Potassium: 4.1 mmol/L (ref 3.5–5.2)
Sodium: 141 mmol/L (ref 134–144)
Total Protein: 7.9 g/dL (ref 6.0–8.5)

## 2017-03-10 LAB — LIPID PANEL
Chol/HDL Ratio: 4.4 ratio (ref 0.0–4.4)
Cholesterol, Total: 172 mg/dL (ref 100–199)
HDL: 39 mg/dL — AB (ref >39)
LDL Calculated: 101 mg/dL — ABNORMAL HIGH (ref 0–99)
TRIGLYCERIDES: 160 mg/dL — AB (ref 0–149)
VLDL CHOLESTEROL CAL: 32 mg/dL (ref 5–40)

## 2017-03-10 MED ORDER — VITAMIN D (ERGOCALCIFEROL) 1.25 MG (50000 UNIT) PO CAPS: 50000.0000 [IU] | ORAL_CAPSULE | ORAL | 0 refills | Status: DC

## 2017-03-10 NOTE — Telephone Encounter (Signed)
[facility]. Informed that Pt did have trial of metformin IR and ER within the past 120 d- and is still on, needs additional medication. Awaiting Pharmacist review. May take 24 hours for response

## 2017-03-12 MED FILL — VIT D2 1.25 MG (50,000 UNIT: 1.25 MG | 84 days supply | Qty: 24 | Fill #0

## 2017-03-13 MED FILL — TRULICITY 0.75 MG/0.5 ML PE: 0.75 | 28 days supply | Qty: 2 | Fill #0

## 2017-03-16 NOTE — Telephone Encounter (Signed)
PA for Trulicity Approved Picked up by pt on March 13, 2017

## 2017-04-09 DIAGNOSIS — Z6833 Body mass index (BMI) 33.0-33.9, adult: Secondary | ICD-10-CM | POA: Diagnosis not present

## 2017-04-09 DIAGNOSIS — Z01419 Encounter for gynecological examination (general) (routine) without abnormal findings: Secondary | ICD-10-CM | POA: Diagnosis not present

## 2017-04-09 DIAGNOSIS — Z1231 Encounter for screening mammogram for malignant neoplasm of breast: Secondary | ICD-10-CM | POA: Diagnosis not present

## 2017-04-10 ENCOUNTER — Encounter: Payer: Self-pay | Admitting: Family Medicine

## 2017-04-13 ENCOUNTER — Ambulatory Visit: Payer: 59 | Admitting: Cardiology

## 2017-04-20 NOTE — Addendum Note (Signed)
Addended by: Shawnee Knapp on: 04/20/2017 04:32 PM   Modules accepted: Level of Service

## 2017-04-30 ENCOUNTER — Telehealth: Payer: Self-pay | Admitting: Cardiology

## 2017-04-30 NOTE — Telephone Encounter (Signed)
Received records from [facility] for appointment on 05/14/17 with Dr Ellyn Hack.  Records put with Dr Allison Quarry schedule for 05/14/17. lp

## 2017-05-01 MED FILL — TRULICITY 0.75 MG/0.5 ML PE: 0.75 | 28 days supply | Qty: 2 | Fill #1

## 2017-05-08 DIAGNOSIS — H52223 Regular astigmatism, bilateral: Secondary | ICD-10-CM | POA: Diagnosis not present

## 2017-05-08 DIAGNOSIS — H524 Presbyopia: Secondary | ICD-10-CM | POA: Diagnosis not present

## 2017-05-08 DIAGNOSIS — E119 Type 2 diabetes mellitus without complications: Secondary | ICD-10-CM | POA: Diagnosis not present

## 2017-05-08 DIAGNOSIS — Z7984 Long term (current) use of oral hypoglycemic drugs: Secondary | ICD-10-CM | POA: Diagnosis not present

## 2017-05-08 DIAGNOSIS — H5203 Hypermetropia, bilateral: Secondary | ICD-10-CM | POA: Diagnosis not present

## 2017-05-14 ENCOUNTER — Ambulatory Visit: Payer: 59 | Admitting: Cardiology

## 2017-06-12 MED FILL — METFORMIN HCL ER 500 MG TAB: 500 | 90 days supply | Qty: 270 | Fill #1

## 2017-06-12 MED FILL — ATORVASTATIN 40 MG TABLET: 40 | 90 days supply | Qty: 90 | Fill #1

## 2017-06-12 MED FILL — IMIPRAMINE HCL 50 MG TABLET: 50 | 90 days supply | Qty: 90 | Fill #1

## 2017-06-12 MED FILL — AMLODIPINE BESYLATE 10 MG T: 10 | 90 days supply | Qty: 90 | Fill #1

## 2017-06-12 MED FILL — TRULICITY 0.75 MG/0.5 ML PE: 0.75 | 28 days supply | Qty: 2 | Fill #2

## 2017-07-10 DIAGNOSIS — G8929 Other chronic pain: Secondary | ICD-10-CM | POA: Insufficient documentation

## 2017-07-10 DIAGNOSIS — E119 Type 2 diabetes mellitus without complications: Secondary | ICD-10-CM | POA: Insufficient documentation

## 2017-07-10 DIAGNOSIS — R109 Unspecified abdominal pain: Secondary | ICD-10-CM

## 2017-07-10 NOTE — Progress Notes (Signed)
Subjective:    Patient ID: [name], female    DOB: Mar 18, 1961, 56 y.o.   MRN: [id number]   Chief Complaint  Patient presents with  . Follow-up    4 month   HPI  DMII: Diagnosed .   Lab Results  Component Value Date   HGBA1C 6.6 03/09/2017   HGBA1C 6.5 11/15/2015   HGBA1C 6.0 10/27/2014   CBGs: Occasionally checks fasting a.m. 101-112; after meal  ; No hypoglycemic episodes. Is fasting today.  Meter type:  Diet: Not to much time to prepare foods so not  Exercising:  Not now. She has been working more hours - doing 2 jobs and working 11 hrs/d.   DM Med Regimen: Trulicity 0.32 qwk, metformin XR 1572m qd  - breaking up during the day to avoid nausea - noticed increased in appetite since we started the Trulicity.    Prior changes: started Trulicity 01.2273/05/02 eGFR: 96 Baseline Cr: 0.71 Last checked 03/09/17. Microalb: Done 11/2015. Normal. Not on acei/arb as both lisinopril and losartan caused cough Lipids:  LDL 101,  non-HDL 133 03/09/2017. On atorvastatin but only takes every 2-3 nights Taking asa 81 qd.  Optho: Seen by [facility] Otter- last exam 11/15/15 - did see this year recently - within the last 2 years. Feet: Monofilament exam done 03/09/17. Denies any no problems.  Not seen by podiatry prior.  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 Contrave (naltrexone/bupropion) -> made her tongue feel restless - moving unintentionally.  No prior wellbutrin use otherwise.  HTN: amlodipine 10. uncontrolled at last visit as had not taken a.m. bp meds. Checking every 2-3 weeks - running 130-140s.  Still has a chronic cough even though off of acei/arbs - takes an allergy pill every day and nexium very day.   HLD: Taking lipitor 40 only every 2-3 nights but lipid panel right near goal - LDL 101, non-HDL 133 03/09/2017  Vit D  def: 18 11/2015 so put on high dose x 6 mos after labs visit in July. No otc vitamins/supp.   NOT immune to hepatitis B or rubella  Imipramine keeps left flank pain away.  Seen at [facility] for pap and mammogram by Dr. MRadene Knee- seen last in June/July - [facility]   Past Medical History:  Diagnosis Date  . Diabetes mellitus without complication ([place]   . Endometriosis   . Fibroids    uterine  . GERD (gastroesophageal reflux disease)   . 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 History of hiatal hernia   . History of kidney stones 2014, 2017  . Hyperlipidemia   . Hypertension   . Neuromuscular disorder ([place] ~2000   "exposed nerve" Lt flank  . Obesity (BMI 30-39.9)   . Peptic stricture of esophagus   . Uterine fibroid    Past Surgical History:  Procedure Laterality Date  . ABDOMINAL HYSTERECTOMY  2008  . BREAST CYST EXCISION  2007  . EXPLORATORY LAPAROTOMY  ~2005  . MASS EXCISION Left 07/11/2016   Procedure: EXCISION MASS LEFT UPPER ARM;  Surgeon: TArmandina Gemma MD;  [facility] [facility]  Service: General;  Laterality: Left;   Current Outpatient Prescriptions on File Prior to Visit  Medication  Sig Dispense Refill  . amLODipine (NORVASC) 10 MG tablet Take 1 tablet (10 mg total) by mouth daily. 90 tablet 3  . aspirin 81 MG tablet Take 81 mg by mouth daily.      Marland Kitchen atorvastatin (LIPITOR) 40 MG tablet Take 1 tablet (40 mg total) by mouth daily. 90 tablet 3  . Calcium-Vitamin D (CALTRATE 600 PLUS-VIT D PO) Take 1 tablet by mouth 2 (two) times daily.      . Dulaglutide (TRULICITY) 6.46 OE/3.2ZY SOPN Inject 1 applicator into the skin once a week. 4 pen 5  . esomeprazole (NEXIUM) 40 MG capsule Take 1 capsule (40 mg total) by mouth at bedtime. 90 capsule 3  . glucose blood test strip Use to check blood sugar daily. 100 each 3  . imipramine (TOFRANIL) 50 MG tablet Take 1  tablet (50 mg total) by mouth at bedtime. 90 tablet 3  . Lancets MISC USE TO CHECK BLOOD SUGAR DAILY. 100 each 3  . metFORMIN (GLUCOPHAGE XR) 500 MG 24 hr tablet Take 3 tablets (1,500 mg total) by mouth daily with breakfast. 270 tablet 3  . Vitamin D, Ergocalciferol, (DRISDOL) 50000 units CAPS capsule Take 1 capsule (50,000 Units total) by mouth every 7 (seven) days. 24 capsule 0   No current facility-administered medications on file prior to visit.    Allergies  Allergen Reactions  . Darvocet [Propoxyphene N-Acetaminophen] Nausea Only  . Lisinopril Cough  . Losartan Cough   Family History  Problem Relation Age of Onset  . Diabetes Mother   . Hypertension Father   . Heart attack Father   . Heart disease Sister   . Diabetes Sister   . Heart disease Brother   . Diabetes Brother   . Hyperlipidemia Brother   . Stroke Brother   . Colon cancer Neg Hx    Social History   Social History  . Marital status: Married    Spouse name: N/A  . Number of children: N/A  . Years of education: N/A   Occupational History  . Pharmacy Tech     Social History Main Topics  . Smoking status: Never Smoker  . Smokeless tobacco: Never Used  . Alcohol use 0.6 oz/week    1 Glasses of wine per week     Comment: 1-2 per week  . Drug use: No  . Sexual activity: Yes   Other Topics Concern  . Not on file   Social History Narrative   2 caffeine drinks daily    Depression screen [facility] 2/9 03/09/2017 11/15/2015 10/27/2014  Decreased Interest 0 0 0  Down, Depressed, Hopeless 0 0 0  PHQ - 2 Score 0 0 0    Review of Systems See hpi Chronic cough Left clavicular prominence    Objective:   Physical Exam  Constitutional: She is oriented to person, place, and time. She appears well-developed and well-nourished. No distress.  HENT:  Head: Normocephalic and atraumatic.  Right Ear: External ear normal.  Left Ear: External ear normal.  Eyes: Conjunctivae are normal. No scleral icterus.  Neck: Normal  range of motion. Neck supple. No thyromegaly present.  Cardiovascular: Normal rate, regular rhythm, normal heart sounds and intact distal pulses.   Pulmonary/Chest: Effort normal and breath sounds normal. No respiratory distress.  Musculoskeletal: She exhibits no edema.  Lymphadenopathy:    She has no cervical adenopathy.  Neurological: She is alert and oriented to person, place, and time.  Skin: Skin is warm and dry. She is not diaphoretic. No  erythema.  Psychiatric: She has a normal mood and affect. Her behavior is normal.   BP (!) 164/84   Pulse (!) 110   Temp 98.8 F (37.1 C) (Oral)   Resp 16   Ht 5' 3" (1.6 m)   Wt 191 lb (86.6 kg)   SpO2 98%   BMI 33.83 kg/m   Results for orders placed or performed in visit on 07/13/17  Microalbumin/Creatinine Ratio, Urine  Result Value Ref Range   Creatinine, Urine 204.6 Not Estab. mg/dL   Albumin, Urine 28.9 Not Estab. ug/mL   Microalb/Creat Ratio 14.1 0.0 - 30.0 mg/g creat  Comprehensive metabolic panel  Result Value Ref Range   Glucose 104 (H) 65 - 99 mg/dL   BUN 12 6 - 24 mg/dL   Creatinine, Ser 0.72 0.57 - 1.00 mg/dL   GFR calc non Af Amer 95 >59 mL/min/1.73   GFR calc Af Amer 109 >59 mL/min/1.73   BUN/Creatinine Ratio 17 9 - 23   Sodium 140 134 - 144 mmol/L   Potassium 4.1 3.5 - 5.2 mmol/L   Chloride 100 96 - 106 mmol/L   CO2 24 20 - 29 mmol/L   Calcium 9.5 8.7 - 10.2 mg/dL   Total Protein 7.9 6.0 - 8.5 g/dL   Albumin 4.6 3.5 - 5.5 g/dL   Globulin, Total 3.3 1.5 - 4.5 g/dL   Albumin/Globulin Ratio 1.4 1.2 - 2.2   Bilirubin Total 0.4 0.0 - 1.2 mg/dL   Alkaline Phosphatase 149 (H) 39 - 117 IU/L   AST 20 0 - 40 IU/L   ALT 28 0 - 32 IU/L  VITAMIN D 25 Hydroxy (Vit-D Deficiency, Fractures)  Result Value Ref Range   Vit D, 25-Hydroxy 35.3 30.0 - 100.0 ng/mL  POCT glycosylated hemoglobin (Hb A1C)  Result Value Ref Range   Hemoglobin A1C 6.6    Dg Chest 2 View  Result Date: 07/13/2017 CLINICAL DATA:  Chronic cough.  Increased prominence of soft tissues over the left clavicle on clinical exam. EXAM: CHEST  2 VIEW COMPARISON:  Report of a chest x-ray of February 12, 2000. FINDINGS: The lungs are adequately inflated. The interstitial markings are coarse. There is no alveolar infiltrate or pleural effusion. No pulmonary parenchymal nodules or masses are observed. The mediastinum is normal in width. There is multilevel degenerative disc disease of the thoracic spine. The observed portions of the left clavicle and upper left ribcage and overlying soft tissues are unremarkable. IMPRESSION: Mild chronic bronchitic changes. No CHF nor other acute cardiopulmonary abnormality. Electronically Signed   By: David  Martinique M.D.   On: 07/13/2017 10:21        Assessment & Plan:  A1c, microalb, cmp, vit D mmr and hep B vaccine? - she did receive something at the hosp last year. She did get her flu shot at the employee clinic this year. Due for colonoscopy with Dr. Henrene Pastor Document pap and mammog in [facility] HGBa1c exactly the same today and has gained 2 lbs Consider adding in hctz  1. Type 2 diabetes mellitus without complication, without long-term current use of insulin ([place])   2. Essential hypertension   3. Hyperlipidemia LDL goal <100   4. Class 1 obesity due to excess calories with serious comorbidity and body mass index (BMI) of 33.0 to 33.9 in adult   5. Vitamin D deficiency   6. Left flank pain, chronic   7. Medication monitoring encounter   8. Cough     Orders Placed This Encounter  Procedures  .  DG Chest 2 View    Standing Status:   Future    Number of Occurrences:   1    Standing Expiration Date:   07/13/2018    Order Specific Question:   Reason for Exam (SYMPTOM  OR DIAGNOSIS REQUIRED)    Answer:   chronic cough, increased prominence of tissue over left clavicle    Order Specific Question:   Is the patient pregnant?    Answer:   No    Order Specific Question:   Preferred imaging location?    Answer:   External  .  Microalbumin/Creatinine Ratio, Urine  . Comprehensive metabolic panel  . VITAMIN D 25 Hydroxy (Vit-D Deficiency, Fractures)  . POCT glycosylated hemoglobin (Hb A1C)    Meds ordered this encounter  Medications  . Zoster Vaccine Adjuvanted [facility]) injection    Sig: Inject 0.5 mLs once for 1 dose into the muscle. Repeat once 2-6 months later    Dispense:  0.5 mL    Refill:  1  . dapagliflozin propanediol (FARXIGA) 5 MG TABS tablet    Sig: Take 5 mg daily by mouth.    Dispense:  30 tablet    Refill:  5  . hydrochlorothiazide (HYDRODIURIL) 25 MG tablet    Sig: Take 1 tablet (25 mg total) daily by mouth.    Dispense:  90 tablet    Refill:  1    Delman Cheadle, M.D.  Primary Care at [facility] [address] [place], [place] [address] [phone] phone [phone] fax  07/16/17 7:00 AM

## 2017-07-13 ENCOUNTER — Ambulatory Visit (INDEPENDENT_AMBULATORY_CARE_PROVIDER_SITE_OTHER): Payer: 59

## 2017-07-13 ENCOUNTER — Encounter: Payer: Self-pay | Admitting: Family Medicine

## 2017-07-13 ENCOUNTER — Ambulatory Visit (INDEPENDENT_AMBULATORY_CARE_PROVIDER_SITE_OTHER): Payer: 59 | Admitting: Family Medicine

## 2017-07-13 VITALS — BP 164/84 | HR 110 | Temp 98.8°F | Resp 16 | Ht 63.0 in | Wt 191.0 lb

## 2017-07-13 DIAGNOSIS — E119 Type 2 diabetes mellitus without complications: Secondary | ICD-10-CM | POA: Diagnosis not present

## 2017-07-13 DIAGNOSIS — E559 Vitamin D deficiency, unspecified: Secondary | ICD-10-CM | POA: Diagnosis not present

## 2017-07-13 DIAGNOSIS — G8929 Other chronic pain: Secondary | ICD-10-CM

## 2017-07-13 DIAGNOSIS — E785 Hyperlipidemia, unspecified: Secondary | ICD-10-CM | POA: Diagnosis not present

## 2017-07-13 DIAGNOSIS — R109 Unspecified abdominal pain: Secondary | ICD-10-CM | POA: Diagnosis not present

## 2017-07-13 DIAGNOSIS — E6609 Other obesity due to excess calories: Secondary | ICD-10-CM | POA: Diagnosis not present

## 2017-07-13 DIAGNOSIS — Z5181 Encounter for therapeutic drug level monitoring: Secondary | ICD-10-CM | POA: Diagnosis not present

## 2017-07-13 DIAGNOSIS — I10 Essential (primary) hypertension: Secondary | ICD-10-CM | POA: Diagnosis not present

## 2017-07-13 DIAGNOSIS — R05 Cough: Secondary | ICD-10-CM

## 2017-07-13 DIAGNOSIS — R059 Cough, unspecified: Secondary | ICD-10-CM

## 2017-07-13 DIAGNOSIS — Z6833 Body mass index (BMI) 33.0-33.9, adult: Secondary | ICD-10-CM | POA: Diagnosis not present

## 2017-07-13 LAB — POCT GLYCOSYLATED HEMOGLOBIN (HGB A1C): Hemoglobin A1C: 6.6

## 2017-07-13 MED ORDER — HYDROCHLOROTHIAZIDE 25 MG PO TABS: 25.0000 mg | ORAL_TABLET | Freq: Every day | ORAL | 1 refills | Status: DC

## 2017-07-13 MED ORDER — ZOSTER VAC RECOMB ADJUVANTED 50 MCG/0.5ML IM SUSR
0.5000 mL | Freq: Once | INTRAMUSCULAR | 1 refills | Status: AC
Start: 2017-07-13 — End: 2017-07-13

## 2017-07-13 MED ORDER — DAPAGLIFLOZIN PROPANEDIOL 5 MG PO TABS: 5.0000 mg | ORAL_TABLET | Freq: Every day | ORAL | 5 refills | Status: DC

## 2017-07-13 MED FILL — HYDROCHLOROTHIAZIDE 25 MG T: 25 | 90 days supply | Qty: 90 | Fill #0

## 2017-07-13 NOTE — Patient Instructions (Addendum)
Please sign a release on your way out of the office otday to get your immunization record sent to [place] from [place].    IF you received an x-ray today, you will receive an invoice from [facility]. Please contact [facility] at [phone] with questions or concerns regarding your invoice.   IF you received labwork today, you will receive an invoice from [facility]. Please contact [facility] at [phone] with questions or concerns regarding your invoice.   Our billing staff will not be able to assist you with questions regarding bills from these companies.  You will be contacted with the lab results as soon as they are available. The fastest way to get your results is to activate your My Chart account. Instructions are located on the last page of this paperwork. If you have not heard from [place] regarding the results in 2 weeks, please contact this office.      Cough, Adult Coughing is a reflex that clears your throat and your airways. Coughing helps to heal and protect your lungs. It is normal to cough occasionally, but a cough that happens with other symptoms or lasts a long time may be a sign of a condition that needs treatment. A cough may last only 2-3 weeks (acute), or it may last longer than 8 weeks (chronic). What are the causes? Coughing is commonly caused by:  Breathing in substances that irritate your lungs.  A viral or bacterial respiratory infection.  Allergies.  Asthma.  Postnasal drip.  Smoking.  Acid backing up from the stomach into the esophagus (gastroesophageal reflux).  Certain medicines.  Chronic lung problems, including COPD (or rarely, lung cancer).  Other medical conditions such as heart failure.  Follow these instructions at home: Pay attention to any changes in your symptoms. Take these actions to help with your discomfort:  Take medicines only as told by your health care provider. ? If you were prescribed an antibiotic  medicine, take it as told by your health care provider. Do not stop taking the antibiotic even if you start to feel better. ? Talk with your health care provider before you take a cough suppressant medicine.  Drink enough fluid to keep your urine clear or pale yellow.  If the air is dry, use a cold steam vaporizer or humidifier in your bedroom or your home to help loosen secretions.  Avoid anything that causes you to cough at work or at home.  If your cough is worse at night, try sleeping in a semi-upright position.  Avoid cigarette smoke. If you smoke, quit smoking. If you need help quitting, ask your health care provider.  Avoid caffeine.  Avoid alcohol.  Rest as needed.  Contact a health care provider if:  You have new symptoms.  You cough up pus.  Your cough does not get better after 2-3 weeks, or your cough gets worse.  You cannot control your cough with suppressant medicines and you are losing sleep.  You develop pain that is getting worse or pain that is not controlled with pain medicines.  You have a fever.  You have unexplained weight loss.  You have night sweats. Get help right away if:  You cough up blood.  You have difficulty breathing.  Your heartbeat is very fast. This information is not intended to replace advice given to you by your health care provider. Make sure you discuss any questions you have with your health care provider. Document Released: 02/21/2011 Document Revised: 01/31/2016 Document Reviewed: 11/01/2014  Elsevier Interactive Patient Education  2017 Elsevier Inc.  

## 2017-07-14 LAB — COMPREHENSIVE METABOLIC PANEL
ALBUMIN: 4.6 g/dL (ref 3.5–5.5)
ALK PHOS: 149 IU/L — AB (ref 39–117)
ALT: 28 IU/L (ref 0–32)
AST: 20 IU/L (ref 0–40)
Albumin/Globulin Ratio: 1.4 (ref 1.2–2.2)
BUN / CREAT RATIO: 17 (ref 9–23)
BUN: 12 mg/dL (ref 6–24)
Bilirubin Total: 0.4 mg/dL (ref 0.0–1.2)
CO2: 24 mmol/L (ref 20–29)
CREATININE: 0.72 mg/dL (ref 0.57–1.00)
Calcium: 9.5 mg/dL (ref 8.7–10.2)
Chloride: 100 mmol/L (ref 96–106)
GFR calc non Af Amer: 95 mL/min/{1.73_m2} (ref >59)
GFR, EST AFRICAN AMERICAN: 109 mL/min/{1.73_m2} (ref >59)
GLOBULIN, TOTAL: 3.3 g/dL (ref 1.5–4.5)
Glucose: 104 mg/dL — ABNORMAL HIGH (ref 65–99)
Potassium: 4.1 mmol/L (ref 3.5–5.2)
SODIUM: 140 mmol/L (ref 134–144)
TOTAL PROTEIN: 7.9 g/dL (ref 6.0–8.5)

## 2017-07-14 LAB — MICROALBUMIN / CREATININE URINE RATIO
CREATININE, UR: 204.6 mg/dL
Microalb/Creat Ratio: 14.1 mg/g creat (ref 0.0–30.0)
Microalbumin, Urine: 28.9 ug/mL

## 2017-07-14 LAB — VITAMIN D 25 HYDROXY (VIT D DEFICIENCY, FRACTURES): VIT D 25 HYDROXY: 35.3 ng/mL (ref 30.0–100.0)

## 2017-07-15 MED FILL — FARXIGA 5 MG TABLET: 5 | 30 days supply | Qty: 30 | Fill #0

## 2017-09-22 MED FILL — AMLODIPINE BESYLATE 10 MG T: 10 | 90 days supply | Qty: 90 | Fill #2

## 2017-09-22 MED FILL — IMIPRAMINE HCL 50 MG TABLET: 50 | 90 days supply | Qty: 90 | Fill #2

## 2017-09-22 MED FILL — FARXIGA 5 MG TABLET: 5 | 30 days supply | Qty: 30 | Fill #1

## 2017-09-22 MED FILL — ATORVASTATIN 40 MG TABLET: 40 | 90 days supply | Qty: 90 | Fill #2

## 2017-09-22 MED FILL — METFORMIN HCL ER 500 MG TAB: 500 | 90 days supply | Qty: 270 | Fill #2

## 2017-10-29 MED FILL — FARXIGA 5 MG TABLET: 5 | 30 days supply | Qty: 30 | Fill #2

## 2017-10-29 MED FILL — HYDROCHLOROTHIAZIDE 25 MG T: 25 | 90 days supply | Qty: 90 | Fill #1

## 2017-11-11 ENCOUNTER — Telehealth: Payer: Self-pay | Admitting: Family Medicine

## 2017-11-11 NOTE — Telephone Encounter (Signed)
Called and left message to remind pt of their apt tomorrow. Advised of building number and time restrictions. °

## 2017-11-12 ENCOUNTER — Encounter: Payer: Self-pay | Admitting: Family Medicine

## 2017-11-12 ENCOUNTER — Other Ambulatory Visit: Payer: Self-pay

## 2017-11-12 ENCOUNTER — Ambulatory Visit: Payer: 59 | Admitting: Family Medicine

## 2017-11-12 VITALS — BP 143/74 | HR 107 | Temp 98.3°F | Resp 18 | Ht 63.0 in | Wt 187.4 lb

## 2017-11-12 DIAGNOSIS — E785 Hyperlipidemia, unspecified: Secondary | ICD-10-CM | POA: Diagnosis not present

## 2017-11-12 DIAGNOSIS — R05 Cough: Secondary | ICD-10-CM | POA: Diagnosis not present

## 2017-11-12 DIAGNOSIS — E559 Vitamin D deficiency, unspecified: Secondary | ICD-10-CM

## 2017-11-12 DIAGNOSIS — I10 Essential (primary) hypertension: Secondary | ICD-10-CM

## 2017-11-12 DIAGNOSIS — Z5181 Encounter for therapeutic drug level monitoring: Secondary | ICD-10-CM

## 2017-11-12 DIAGNOSIS — E119 Type 2 diabetes mellitus without complications: Secondary | ICD-10-CM | POA: Diagnosis not present

## 2017-11-12 DIAGNOSIS — Z1211 Encounter for screening for malignant neoplasm of colon: Secondary | ICD-10-CM | POA: Diagnosis not present

## 2017-11-12 DIAGNOSIS — J42 Unspecified chronic bronchitis: Secondary | ICD-10-CM | POA: Diagnosis not present

## 2017-11-12 DIAGNOSIS — R053 Chronic cough: Secondary | ICD-10-CM

## 2017-11-12 LAB — POCT GLYCOSYLATED HEMOGLOBIN (HGB A1C): HEMOGLOBIN A1C: 7.3

## 2017-11-12 MED ORDER — HYDROCHLOROTHIAZIDE 25 MG PO TABS: 25.0000 mg | ORAL_TABLET | Freq: Every day | ORAL | 3 refills | Status: DC

## 2017-11-12 MED ORDER — ALBUTEROL SULFATE (2.5 MG/3ML) 0.083% IN NEBU
2.5000 mg | INHALATION_SOLUTION | Freq: Once | RESPIRATORY_TRACT | Status: AC
  Administered 2017-11-12: 2.5 mg via RESPIRATORY_TRACT

## 2017-11-12 MED ORDER — MONTELUKAST SODIUM 10 MG PO TABS: 10.0000 mg | ORAL_TABLET | Freq: Every day | ORAL | 3 refills | Status: DC

## 2017-11-12 MED ORDER — DAPAGLIFLOZIN PROPANEDIOL 10 MG PO TABS: 10.0000 mg | ORAL_TABLET | Freq: Every day | ORAL | 3 refills | Status: DC

## 2017-11-12 MED FILL — FARXIGA 10 MG TABLET: 10 | 90 days supply | Qty: 90 | Fill #0

## 2017-11-12 NOTE — Progress Notes (Addendum)
Subjective:  By signing my name below, I, [name], attest that this documentation has been prepared under the direction and in the presence of Summer Cheadle, MD Electronically Signed: Ladene [name], ED Scribe 11/12/2017 at 9:36 AM.   Patient ID: [name], female    DOB: [birth year], 57 y.o.   MRN: [id number]  Chief Complaint  Patient presents with  . Diabetes    pt states she checks sugar about once a week and range between 105 and 110.  . Weight Check  . Follow-up   HPI [name] is a 57 y.o. female who presents to Primary Care at [facility] for f/u. Lost 4 lbs since visit 4 months ago. Started pt on once a day farxiga. Tried trulicity to help with weight loss but she noticed an increase in appetite with weight gain x 4 months and metformin xr causes frequent nausea. Tried Contrave which caused unintentional movement and her tongue to feel restless. Has not tried wellbutrin alone.  Pt checks her blood glucose regularly with average readings of 105-110. Reports occasionally feeling like her blood glucose is low at home, however, she doesn't actually check her blood glucose during these times. Denies feet pain.  HTN On Amlodipine 10. Chronic cough even though off ACE and ARBs, takes Zyrtec or Claritin in the summer and Nexium daily. Added HCTZ 25 at last visit. She doesn't check her BP regularly outside of the office. Typically takes her BP meds in the morning but skipped this morning since she is fasting. No h/o asthma or inhaler use.  Vitamins/OTC Pt took high dose Vit D x 26 wks; has not taken OTC Vit D since she ran out. Still doing OTC calcium and daily asa.  Past Medical History:  Diagnosis Date  . Diabetes mellitus without complication ([place])   . Endometriosis   . Fibroids    uterine  . GERD (gastroesophageal reflux disease)   . 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 History of hiatal hernia   .  History of kidney stones 2014, 2017  . Hyperlipidemia   . Hypertension   . Neuromuscular disorder ([place]) ~2000   "exposed nerve" Lt flank  . Obesity (BMI 30-39.9)   . Peptic stricture of esophagus   . Uterine fibroid    Current Outpatient Medications on File Prior to Visit  Medication Sig Dispense Refill  . amLODipine (NORVASC) 10 MG tablet Take 1 tablet (10 mg total) by mouth daily. 90 tablet 3  . aspirin 81 MG tablet Take 81 mg by mouth daily.      Marland Kitchen atorvastatin (LIPITOR) 40 MG tablet Take 1 tablet (40 mg total) by mouth daily. 90 tablet 3  . Calcium-Vitamin D (CALTRATE 600 PLUS-VIT D PO) Take 1 tablet by mouth 2 (two) times daily.      . dapagliflozin propanediol (FARXIGA) 5 MG TABS tablet Take 5 mg daily by mouth. 30 tablet 5  . esomeprazole (NEXIUM) 40 MG capsule Take 1 capsule (40 mg total) by mouth at bedtime. 90 capsule 3  . glucose blood test strip Use to check blood sugar daily. 100 each 3  . hydrochlorothiazide (HYDRODIURIL) 25 MG tablet Take 1 tablet (25 mg total) daily by mouth. 90 tablet 1  . imipramine (TOFRANIL) 50 MG tablet Take 1 tablet (50 mg total) by mouth at bedtime. 90 tablet 3  . Lancets MISC USE TO CHECK BLOOD SUGAR DAILY. 100  each 3  . metFORMIN (GLUCOPHAGE XR) 500 MG 24 hr tablet Take 3 tablets (1,500 mg total) by mouth daily with breakfast. 270 tablet 3  . Vitamin D, Ergocalciferol, (DRISDOL) 50000 units CAPS capsule Take 1 capsule (50,000 Units total) by mouth every 7 (seven) days. 24 capsule 0   No current facility-administered medications on file prior to visit.    Allergies  Allergen Reactions  . Darvocet [Propoxyphene N-Acetaminophen] Nausea Only  . Lisinopril Cough  . Losartan Cough   Past Surgical History:  Procedure Laterality Date  . ABDOMINAL HYSTERECTOMY  2008  . BREAST CYST EXCISION  2007  . EXPLORATORY LAPAROTOMY  ~2005  . MASS EXCISION Left 07/11/2016   Procedure: EXCISION MASS LEFT UPPER ARM;  Surgeon: Armandina Gemma, MD;  Location: [place];  Service: General;  Laterality: Left;   Family History  Problem Relation Age of Onset  . Diabetes Mother   . Hypertension Father   . Heart attack Father   . Heart disease Sister   . Diabetes Sister   . Heart disease Brother   . Diabetes Brother   . Hyperlipidemia Brother   . Stroke Brother   . Colon cancer Neg Hx    Social History   Socioeconomic History  . Marital status: Married    Spouse name: None  . Number of children: None  . Years of education: None  . Highest education level: None  Social Needs  . Financial resource strain: None  . Food insecurity - worry: None  . Food insecurity - inability: None  . Transportation needs - medical: None  . Transportation needs - non-medical: None  Occupational History  . Occupation: Occupational psychologist   Tobacco Use  . Smoking status: Never Smoker  . Smokeless tobacco: Never Used  Substance and Sexual Activity  . Alcohol use: Yes    Alcohol/week: 0.6 oz    Types: 1 Glasses of wine per week    Comment: 1-2 per week  . Drug use: No  . Sexual activity: Yes  Other Topics Concern  . None  Social History Narrative   2 caffeine drinks daily    Depression screen [facility] 2/9 11/12/2017 03/09/2017 11/15/2015 10/27/2014  Decreased Interest 0 0 0 0  Down, Depressed, Hopeless 0 0 0 0  PHQ - 2 Score 0 0 0 0    Review of Systems  Respiratory: Positive for cough (chronic).   Musculoskeletal: Negative for myalgias.  Neurological: Negative for numbness.      Objective:   Physical Exam  Constitutional: She is oriented to person, place, and time. She appears well-developed and well-nourished. No distress.  HENT:  Head: Normocephalic and atraumatic.  Eyes: Conjunctivae and EOM are normal.  Neck: Neck supple. No tracheal deviation present.  Cardiovascular: Regular rhythm, S1 normal, S2 normal and normal heart sounds. Tachycardia present. Exam reveals no gallop and no friction rub.  No murmur heard. Pulmonary/Chest: Effort  normal and breath sounds normal. No respiratory distress.  Musculoskeletal: Normal range of motion.  Neurological: She is alert and oriented to person, place, and time.  Skin: Skin is warm and dry.  Psychiatric: She has a normal mood and affect. Her behavior is normal.  Nursing note and vitals reviewed.  BP (!) 143/74 (BP Location: Right Arm, Patient Position: Sitting, Cuff Size: Normal)   Pulse (!) 107   Temp 98.3 F (36.8 C) (Oral)   Resp 18   Ht 5\' 3"  (1.6 m)   Wt 187 lb 6.4 oz (  85 kg)   SpO2 96%   BMI 33.20 kg/m      Results for orders placed or performed in visit on 11/12/17  POCT glycosylated hemoglobin (Hb A1C)  Result Value Ref Range   Hemoglobin A1C 7.3    Preneb spirometry:   Post-neb spirometry:    Assessment & Plan:  Ok to refill any meds x 6 months when requested.  1. Type 2 diabetes mellitus without complication, without long-term current use of insulin ([place])   2. Hyperlipidemia LDL goal <100   3. Medication monitoring encounter   4. Essential hypertension   5. Special screening for malignant neoplasms, colon   6. Vitamin D deficiency   7. Chronic bronchitis, unspecified chronic bronchitis type ([place])   8. Chronic cough     Orders Placed This Encounter  Procedures  . Lipid panel    Order Specific Question:   Has the patient fasted?    Answer:   Yes  . Comprehensive metabolic panel    Order Specific Question:   Has the patient fasted?    Answer:   Yes  . Cologuard  . VITAMIN D 25 Hydroxy (Vit-D Deficiency, Fractures)  . VITAMIN D 25 Hydroxy (Vit-D Deficiency, Fractures)  . Care order/instruction:    Scheduling Instructions:     Spirometry IF NEB IS ORDERED PLEASE DO A SPIROMETRY BEFORE AND AFTER.  Marland Kitchen POCT glycosylated hemoglobin (Hb A1C)    Meds ordered this encounter  Medications  . albuterol (PROVENTIL) (2.5 MG/3ML) 0.083% nebulizer solution 2.5 mg  . dapagliflozin propanediol (FARXIGA) 10 MG TABS tablet    Sig: Take 10 mg by mouth daily.      Dispense:  90 tablet    Refill:  3  . hydrochlorothiazide (HYDRODIURIL) 25 MG tablet    Sig: Take 1 tablet (25 mg total) by mouth daily.    Dispense:  90 tablet    Refill:  3  . montelukast (SINGULAIR) 10 MG tablet    Sig: Take 1 tablet (10 mg total) by mouth at bedtime.    Dispense:  30 tablet    Refill:  3    I personally performed the services described in this documentation, which was scribed in my presence. The recorded information has been reviewed and considered, and addended by me as needed.   [name], M.D.  Primary Care at [facility] [address] [place], [place] [address] [phone] phone [phone] fax  11/15/17 9:01 AM

## 2017-11-12 NOTE — Patient Instructions (Addendum)
Please stop by the front office to sign a release for Dr. Ophelia Charter office so we can get a copy of your last mammogram for our system.  Also sign a release for [facility] so we can get your vaccinations from there into the system so we can check to see if you have had the repeat MMR and hepatitis B vaccine that you need.  Your hemoglobin a1c has increased so we increase the Farxiga dose form 5 to 49m and recheck in 4 mos.  Try the singulair for a few months as that might help a cough from both allergies or asthma. If that doesn't work, we could always do a trial of an inhaler. Your baseline breathing test looked good.  IF you received an x-ray today, you will [facility]. Please contact [facility] at [phone] questions or concerns regarding your invoice.   IF you received labwork today, you will receive an invoice from [facility] Please contact [facility] at [phone] questions or concerns regarding your invoice.   Our billing staff will not be able to assist you with questions regarding bills from these companies.  You will be contacted with the lab results as soon as they are available. The fastest way to get your results is to activate your My Chart account. Instructions are located on the last page of this paperwork. If you have not heard from [place] results in [place] weeks, please contact this office.     Diabetes Mellitus and Standards of Medical Care Managing diabetes (diabetes mellitus) can be complicated. Your diabetes treatment may be managed by a team of health care providers, including:  A diet and nutrition specialist (registered dietitian).  A nurse.  A certified diabetes educator (CDE).  A diabetes specialist (endocrinologist).  An eye doctor.  A primary care provider.  A dentist.  Your health care providers follow a schedule in order to help you get the best quality of care. The following schedule  is a general guideline for your diabetes management plan. Your health care providers may also give you more specific instructions. HbA1c ( hemoglobin A1c) test This test provides information about blood sugar (glucose) control over the previous 2-3 months. It is used to check whether your diabetes management plan needs to be adjusted.  If you are meeting your treatment goals, this test is done at least 2 times a year.  If you are not meeting treatment goals or if your treatment goals have changed, this test is done 4 times a year.  Blood pressure test  This test is done at every routine medical visit. For most people, the goal is less than 130/80. Ask your health care provider what your goal blood pressure should be. Dental and eye exams  Visit your dentist two times a year.  If you have type 1 diabetes, get an eye exam 3-5 years after you are diagnosed, and then once a year after your first exam. ? If you were diagnosed with type 1 diabetes as a child, get an eye exam when you are age 3664or older and have had diabetes for 3-5 years. After the first exam, you should get an eye exam once a year.  If you have type 2 diabetes, have an eye exam as soon as you are diagnosed, and then once a year after your first exam. Foot care exam  Visual foot exams are done at every routine medical visit. The exams check for cuts, bruises,  redness, blisters, sores, or other problems with the feet.  A complete foot exam is done by your health care provider once a year. This exam includes an inspection of the structure and skin of your feet, and a check of the pulses and sensation in your feet. ? Type 1 diabetes: Get your first exam 3-5 years after diagnosis. ? Type 2 diabetes: Get your first exam as soon as you are diagnosed.  Check your feet every day for cuts, bruises, redness, blisters, or sores. If you have any of these or other problems that are not healing, contact your health care provider. Kidney  function test ( urine microalbumin)  This test is done once a year. ? Type 1 diabetes: Get your first test 5 years after diagnosis. ? Type 2 diabetes: Get your first test as soon as you are diagnosed.  If you have chronic kidney disease (CKD), get a serum creatinine and estimated glomerular filtration rate (eGFR) test once a year. Lipid profile (cholesterol, HDL, LDL, triglycerides)  This test should be done when you are diagnosed with diabetes, and every 5 years after the first test. If you are on medicines to lower your cholesterol, you may need to get this test done every year. ? The goal for LDL is less than 100 mg/dL (5.5 mmol/L). If you are at high risk, the goal is less than 70 mg/dL (3.9 mmol/L). ? The goal for HDL is 40 mg/dL (2.2 mmol/L) for men and 50 mg/dL(2.8 mmol/L) for women. An HDL cholesterol of 60 mg/dL (3.3 mmol/L) or higher gives some protection against heart disease. ? The goal for triglycerides is less than 150 mg/dL (8.3 mmol/L). Immunizations  The yearly flu (influenza) vaccine is recommended for everyone 6 months or older who has diabetes.  The pneumonia (pneumococcal) vaccine is recommended for everyone 2 years or older who has diabetes. If you are 57 or older, you may get the pneumonia vaccine as a series of two separate shots.  The hepatitis B vaccine is recommended for adults shortly after they have been diagnosed with diabetes.  The Tdap (tetanus, diphtheria, and pertussis) vaccine should be given: ? According to normal childhood vaccination schedules, for children. ? Every 10 years, for adults who have diabetes.  The shingles vaccine is recommended for people who have had chicken pox and are 50 years or older. Mental and emotional health  Screening for symptoms of eating disorders, anxiety, and depression is recommended at the time of diagnosis and afterward as needed. If your screening shows that you have symptoms (you have a positive screening result),  you may need further evaluation and be referred to a mental health care provider. Diabetes self-management education  Education about how to manage your diabetes is recommended at diagnosis and ongoing as needed. Treatment plan  Your treatment plan will be reviewed at every medical visit. Summary  Managing diabetes (diabetes mellitus) can be complicated. Your diabetes treatment may be managed by a team of health care providers.  Your health care providers follow a schedule in order to help you get the best quality of care.  Standards of care including having regular physical exams, blood tests, blood pressure monitoring, immunizations, screening tests, and education about how to manage your diabetes.  Your health care providers may also give you more specific instructions based on your individual health. This information is not intended to replace advice given to you by your health care provider. Make sure you discuss any questions you have with your health  care provider. Document Released: 06/22/2009 Document Revised: 05/23/2016 Document Reviewed: 05/23/2016 Elsevier Interactive Patient Education  [employer].

## 2017-11-13 LAB — COMPREHENSIVE METABOLIC PANEL
A/G RATIO: 1.5 (ref 1.2–2.2)
ALT: 35 IU/L — ABNORMAL HIGH (ref 0–32)
AST: 26 IU/L (ref 0–40)
Albumin: 4.5 g/dL (ref 3.5–5.5)
Alkaline Phosphatase: 142 IU/L — ABNORMAL HIGH (ref 39–117)
BUN/Creatinine Ratio: 18 (ref 9–23)
BUN: 12 mg/dL (ref 6–24)
Bilirubin Total: 0.6 mg/dL (ref 0.0–1.2)
CALCIUM: 9.6 mg/dL (ref 8.7–10.2)
CO2: 27 mmol/L (ref 20–29)
CREATININE: 0.65 mg/dL (ref 0.57–1.00)
Chloride: 93 mmol/L — ABNORMAL LOW (ref 96–106)
GFR, EST AFRICAN AMERICAN: 115 mL/min/{1.73_m2} (ref >59)
GFR, EST NON AFRICAN AMERICAN: 100 mL/min/{1.73_m2} (ref >59)
GLOBULIN, TOTAL: 3.1 g/dL (ref 1.5–4.5)
Glucose: 128 mg/dL — ABNORMAL HIGH (ref 65–99)
POTASSIUM: 3.3 mmol/L — AB (ref 3.5–5.2)
Sodium: 137 mmol/L (ref 134–144)
TOTAL PROTEIN: 7.6 g/dL (ref 6.0–8.5)

## 2017-11-13 LAB — LIPID PANEL
CHOL/HDL RATIO: 4.1 ratio (ref 0.0–4.4)
CHOLESTEROL TOTAL: 165 mg/dL (ref 100–199)
HDL: 40 mg/dL (ref >39)
LDL Calculated: 92 mg/dL (ref 0–99)
TRIGLYCERIDES: 164 mg/dL — AB (ref 0–149)
VLDL Cholesterol Cal: 33 mg/dL (ref 5–40)

## 2017-11-13 LAB — VITAMIN D 25 HYDROXY (VIT D DEFICIENCY, FRACTURES): Vit D, 25-Hydroxy: 29.2 ng/mL — ABNORMAL LOW (ref 30.0–100.0)

## 2017-12-23 ENCOUNTER — Ambulatory Visit: Payer: 59 | Admitting: Podiatry

## 2017-12-23 ENCOUNTER — Encounter: Payer: Self-pay | Admitting: Podiatry

## 2017-12-23 DIAGNOSIS — L600 Ingrowing nail: Secondary | ICD-10-CM

## 2017-12-23 NOTE — Patient Instructions (Signed)
Place 1/4 cup of epsom salts in a quart of warm tap water.  Submerge your foot or feet in the solution and soak for 20 minutes.  This soak should be done twice a day.  Next, remove your foot or feet from solution, blot dry the affected area. Apply ointment and cover if instructed by your doctor.   IF YOUR SKIN BECOMES IRRITATED WHILE USING THESE INSTRUCTIONS, IT IS OKAY TO SWITCH TO  WHITE VINEGAR AND WATER.  As another alternative soak, you may use antibacterial soap and water.  Monitor for any signs/symptoms of infection. Call the office immediately if any occur or go directly to the emergency room. Call with any questions/concerns.  Ingrown Toenail An ingrown toenail occurs when the corner or sides of your toenail grow into the surrounding skin. The big toe is most commonly affected, but it can happen to any of your toes. If your ingrown toenail is not treated, you will be at risk for infection. What are the causes? This condition may be caused by:  Wearing shoes that are too small or tight.  Injury or trauma, such as stubbing your toe or having your toe stepped on.  Improper cutting or care of your toenails.  Being born with (congenital) nail or foot abnormalities, such as having a nail that is too big for your toe.  What increases the risk? Risk factors for an ingrown toenail include:  Age. Your nails tend to thicken as you get older, so ingrown nails are more common in older people.  Diabetes.  Cutting your toenails incorrectly.  Blood circulation problems.  What are the signs or symptoms? Symptoms may include:  Pain, soreness, or tenderness.  Redness.  Swelling.  Hardening of the skin surrounding the toe.  Your ingrown toenail may be infected if there is fluid, pus, or drainage. How is this diagnosed? An ingrown toenail may be diagnosed by medical history and physical exam. If your toenail is infected, your health care provider may test a sample of the  drainage. How is this treated? Treatment depends on the severity of your ingrown toenail. Some ingrown toenails may be treated at home. More severe or infected ingrown toenails may require surgery to remove all or part of the nail. Infected ingrown toenails may also be treated with antibiotic medicines. Follow these instructions at home:  If you were prescribed an antibiotic medicine, finish all of it even if you start to feel better.  Soak your foot in warm soapy water for 20 minutes, 3 times per day or as directed by your health care provider.  Carefully lift the edge of the nail away from the sore skin by wedging a small piece of cotton under the corner of the nail. This may help with the pain. Be careful not to cause more injury to the area.  Wear shoes that fit well. If your ingrown toenail is causing you pain, try wearing sandals, if possible.  Trim your toenails regularly and carefully. Do not cut them in a curved shape. Cut your toenails straight across. This prevents injury to the skin at the corners of the toenail.  Keep your feet clean and dry.  If you are having trouble walking and are given crutches by your health care provider, use them as directed.  Do not pick at your toenail or try to remove it yourself.  Take medicines only as directed by your health care provider.  Keep all follow-up visits as directed by your health care provider. This   is important. Contact a health care provider if:  Your symptoms do not improve with treatment. Get help right away if:  You have red streaks that start at your foot and go up your leg.  You have a fever.  You have increased redness, swelling, or pain.  You have fluid, blood, or pus coming from your toenail. This information is not intended to replace advice given to you by your health care provider. Make sure you discuss any questions you have with your health care provider. Document Released: 08/22/2000 Document Revised:  01/25/2016 Document Reviewed: 07/19/2014 Elsevier Interactive Patient Education  2018 Elsevier Inc.  

## 2017-12-29 NOTE — Progress Notes (Signed)
   Subjective: 57 year old female with PMHx of T2DM presents today for evaluation of pain to the lateral border of the left great toe that began a few weeks ago. She reports associated redness and swelling. Patient is concerned for possible ingrown nail. Applying pressure to the toe increases the pain. She has not done anything to treat the symptoms. Patient presents today for further treatment and evaluation.  Past Medical History:  Diagnosis Date  . Diabetes mellitus without complication ([place])   . Endometriosis   . Fibroids    uterine  . GERD (gastroesophageal reflux disease)   . 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 History of hiatal hernia   . History of kidney stones 2014, 2017  . Hyperlipidemia   . Hypertension   . 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 Obesity (BMI 30-39.9)   . Peptic stricture of esophagus   . Uterine fibroid     Objective:  General: Well developed, nourished, in no acute distress, alert and oriented x3   Dermatology: Skin is warm, dry and supple bilateral. Lateral border of the left great toe appears to be erythematous with evidence of an ingrowing nail. Pain on palpation noted to the border of the nail fold. The remaining nails appear unremarkable at this time. There are no open sores, lesions.  Vascular: Dorsalis Pedis artery and Posterior Tibial artery pedal pulses palpable. No lower extremity edema noted.   Neruologic: Grossly intact via light touch bilateral.  Musculoskeletal: Muscular strength within normal limits in all groups bilateral. Normal range of motion noted to all pedal and ankle joints.   Assesement: #1 Paronychia with ingrowing nail lateral border left great toe #2 Pain in toe #3 Incurvated nail  Plan of Care:  1. Patient evaluated.  2. Discussed treatment alternatives and plan of care. Explained nail avulsion procedure and post  procedure course to patient. 3. Patient opted for temporary partial nail avulsion.  4. Prior to procedure, local anesthesia infiltration utilized using 3 ml of a 50:50 mixture of 2% plain lidocaine and 0.5% plain marcaine in a normal hallux block fashion and a betadine prep performed.  5. Partial temporary nail avulsion performed.  6. Light dressing applied. 7. Return to clinic as needed.   Edrick Kins, DPM [facility]  Dr. Edrick Kins, [address]                                        [place], [place] [address]                Office [phone]  Fax [phone]

## 2018-02-22 MED FILL — AMLODIPINE BESYLATE 10 MG T: 10 | 90 days supply | Qty: 90 | Fill #3

## 2018-03-09 MED FILL — FARXIGA 10 MG TABLET: 10 | 90 days supply | Qty: 90 | Fill #1

## 2018-03-25 ENCOUNTER — Ambulatory Visit: Payer: 59 | Admitting: Family Medicine

## 2018-03-25 VITALS — BP 136/77 | HR 100 | Temp 98.8°F | Resp 16 | Ht 63.0 in | Wt 186.0 lb

## 2018-03-25 DIAGNOSIS — M792 Neuralgia and neuritis, unspecified: Secondary | ICD-10-CM | POA: Diagnosis not present

## 2018-03-25 DIAGNOSIS — E559 Vitamin D deficiency, unspecified: Secondary | ICD-10-CM

## 2018-03-25 DIAGNOSIS — K219 Gastro-esophageal reflux disease without esophagitis: Secondary | ICD-10-CM | POA: Diagnosis not present

## 2018-03-25 DIAGNOSIS — E119 Type 2 diabetes mellitus without complications: Secondary | ICD-10-CM | POA: Diagnosis not present

## 2018-03-25 DIAGNOSIS — Z1212 Encounter for screening for malignant neoplasm of rectum: Secondary | ICD-10-CM

## 2018-03-25 DIAGNOSIS — I10 Essential (primary) hypertension: Secondary | ICD-10-CM | POA: Diagnosis not present

## 2018-03-25 DIAGNOSIS — E785 Hyperlipidemia, unspecified: Secondary | ICD-10-CM | POA: Diagnosis not present

## 2018-03-25 DIAGNOSIS — Z1211 Encounter for screening for malignant neoplasm of colon: Secondary | ICD-10-CM

## 2018-03-25 LAB — POCT GLYCOSYLATED HEMOGLOBIN (HGB A1C): Hemoglobin A1C: 6.9 % — AB (ref 4.0–5.6)

## 2018-03-25 MED ORDER — AMLODIPINE-OLMESARTAN 10-40 MG PO TABS: 1.0000 | ORAL_TABLET | Freq: Every day | ORAL | 1 refills | Status: DC

## 2018-03-25 MED ORDER — ESOMEPRAZOLE MAGNESIUM 40 MG PO CPDR: 40.0000 mg | DELAYED_RELEASE_CAPSULE | Freq: Every day | ORAL | 3 refills | Status: DC

## 2018-03-25 MED ORDER — ATORVASTATIN CALCIUM 40 MG PO TABS: 40.0000 mg | ORAL_TABLET | Freq: Every day | ORAL | 3 refills | Status: DC

## 2018-03-25 MED ORDER — IMIPRAMINE HCL 50 MG PO TABS: 50.0000 mg | ORAL_TABLET | Freq: Every day | ORAL | 3 refills | Status: DC

## 2018-03-25 MED ORDER — METFORMIN HCL ER 500 MG PO TB24: 1500.0000 mg | ORAL_TABLET | Freq: Every day | ORAL | 3 refills | Status: DC

## 2018-03-25 MED FILL — AMLODIPINE-OLMESARTAN 10-40: 10-40 | 90 days supply | Qty: 90 | Fill #0

## 2018-03-25 MED FILL — ATORVASTATIN 40 MG TABLET: 40 | 90 days supply | Qty: 90 | Fill #0

## 2018-03-25 MED FILL — ESOMEPRAZOLE MAG DR 40 MG C: 40 | 90 days supply | Qty: 90 | Fill #0

## 2018-03-25 MED FILL — IMIPRAMINE HCL 50 MG TABLET: 50 | 90 days supply | Qty: 90 | Fill #0

## 2018-03-25 MED FILL — METFORMIN HCL ER 500 MG TAB: 500 | 90 days supply | Qty: 270 | Fill #0

## 2018-03-25 NOTE — Patient Instructions (Addendum)
IF you received an x-ray today, you will receive an invoice from [facility]. Please contact [facility] at [phone] with questions or concerns regarding your invoice.   IF you received labwork today, you will receive an invoice from [place]. Please contact [facility] at [phone] with questions or concerns regarding your invoice.   Our billing staff will not be able to assist you with questions regarding bills from these companies.  You will be contacted with the lab results as soon as they are available. The fastest way to get your results is to activate your My Chart account. Instructions are located on the last page of this paperwork. If you have not heard from [place] regarding the results in 2 weeks, please contact this office.      Diabetes Mellitus and Exercise Exercising regularly is important for your overall health, especially when you have diabetes (diabetes mellitus). Exercising is not only about losing weight. It has many health benefits, such as increasing muscle strength and bone density and reducing body fat and stress. This leads to improved fitness, flexibility, and endurance, all of which result in better overall health. Exercise has additional benefits for people with diabetes, including:  Reducing appetite.  Helping to lower and control blood glucose.  Lowering blood pressure.  Helping to control amounts of fatty substances (lipids) in the blood, such as cholesterol and triglycerides.  Helping the body to respond better to insulin (improving insulin sensitivity).  Reducing how much insulin the body needs.  Decreasing the risk for heart disease by: ? Lowering cholesterol and triglyceride levels. ? Increasing the levels of good cholesterol. ? Lowering blood glucose levels.  What is my activity plan? Your health care provider or certified diabetes educator can help you make a plan for the type and frequency of exercise (activity plan)  that works for you. Make sure that you:  Do at least 150 minutes of moderate-intensity or vigorous-intensity exercise each week. This could be brisk walking, biking, or water aerobics. ? Do stretching and strength exercises, such as yoga or weightlifting, at least 2 times a week. ? Spread out your activity over at least 3 days of the week.  Get some form of physical activity every day. ? Do not go more than 2 days in a row without some kind of physical activity. ? Avoid being inactive for more than 90 minutes at a time. Take frequent breaks to walk or stretch.  Choose a type of exercise or activity that you enjoy, and set realistic goals.  Start slowly, and gradually increase the intensity of your exercise over time.  What do I need to know about managing my diabetes?  Check your blood glucose before and after exercising. ? If your blood glucose is higher than 240 mg/dL (13.3 mmol/L) before you exercise, check your urine for ketones. If you have ketones in your urine, do not exercise until your blood glucose returns to normal.  Know the symptoms of low blood glucose (hypoglycemia) and how to treat it. Your risk for hypoglycemia increases during and after exercise. Common symptoms of hypoglycemia can include: ? Hunger. ? Anxiety. ? Sweating and feeling clammy. ? Confusion. ? Dizziness or feeling light-headed. ? Increased heart rate or palpitations. ? Blurry vision. ? Tingling or numbness around the mouth, lips, or tongue. ? Tremors or shakes. ? Irritability.  Keep a rapid-acting carbohydrate snack available before, during, and after exercise to help prevent or treat hypoglycemia.  Avoid injecting insulin into areas of the  body that are going to be exercised. For example, avoid injecting insulin into: ? The arms, when playing tennis. ? The legs, when jogging.  Keep records of your exercise habits. Doing this can help you and your health care provider adjust your diabetes management  plan as needed. Write down: ? Food that you eat before and after you exercise. ? Blood glucose levels before and after you exercise. ? The type and amount of exercise you have done. ? When your insulin is expected to peak, if you use insulin. Avoid exercising at times when your insulin is peaking.  When you start a new exercise or activity, work with your health care provider to make sure the activity is safe for you, and to adjust your insulin, medicines, or food intake as needed.  Drink plenty of water while you exercise to prevent dehydration or heat stroke. Drink enough fluid to keep your urine clear or pale yellow. This information is not intended to replace advice given to you by your health care provider. Make sure you discuss any questions you have with your health care provider. Document Released: 11/15/2003 Document Revised: 03/14/2016 Document Reviewed: 02/04/2016 Elsevier Interactive Patient Education  2018 [employer].

## 2018-03-25 NOTE — Progress Notes (Signed)
Subjective:  By signing my name below, I, [name], attest that this documentation has been prepared under the direction and in the presence of Delman Cheadle, MD. Electronically Signed: Moises [name], [place]. 03/25/2018 , 10:09 AM .  Patient was seen in Room 2 .   Patient ID: [name], female    DOB: 1961-07-17, 57 y.o.   MRN: [id number] Chief Complaint  Patient presents with  . Diabetes    4 month check  . Medication Refill    all on list   HPI [name] is a 56 y.o. female who presents to Primary Care at [facility] for diabetes follow up and medication refill. She is fasting today. Her diet "could be better" recently. She denies any complications with her medications. She has an appointment with her eye doctor scheduled in August.   She checks her BP outside of office about once every 2 weeks, running around 130s-140s. She also takes an aspirin 81 mg qd.   She never received ColoGuard in March.   Past Medical History:  Diagnosis Date  . Diabetes mellitus without complication ([place])   . Endometriosis   . Fibroids    uterine  . GERD (gastroesophageal reflux disease)   . 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 History of hiatal hernia   . History of kidney stones 2014, 2017  . Hyperlipidemia   . Hypertension   . Neuromuscular disorder ([place]) ~2000   "exposed nerve" Lt flank  . Obesity (BMI 30-39.9)   . Peptic stricture of esophagus   . Uterine fibroid    Past Surgical History:  Procedure Laterality Date  . ABDOMINAL HYSTERECTOMY  2008  . BREAST CYST EXCISION  2007  . EXPLORATORY LAPAROTOMY  ~2005  . MASS EXCISION Left 07/11/2016   Procedure: EXCISION MASS LEFT UPPER ARM;  Surgeon: Armandina Gemma, MD;  Location: [facility];  Service: General;  Laterality: Left;   Prior to Admission medications   Medication Sig Start Date End Date Taking? Authorizing Provider  amLODipine (NORVASC)  10 MG tablet Take 1 tablet (10 mg total) by mouth daily. 03/09/17   Shawnee Knapp, MD  aspirin 81 MG tablet Take 81 mg by mouth daily.      [provider]  atorvastatin (LIPITOR) 40 MG tablet Take 1 tablet (40 mg total) by mouth daily. 03/09/17   Shawnee Knapp, MD  Calcium-Vitamin D (CALTRATE 600 PLUS-VIT D PO) Take 1 tablet by mouth 2 (two) times daily.      [provider]  dapagliflozin propanediol (FARXIGA) 10 MG TABS tablet Take 10 mg by mouth daily. 11/12/17   Shawnee Knapp, MD  esomeprazole (NEXIUM) 40 MG capsule Take 1 capsule (40 mg total) by mouth at bedtime. 03/09/17   Shawnee Knapp, MD  glucose [name] test strip Use to check [name] sugar daily. 03/30/13   Shawnee Knapp, MD  hydrochlorothiazide (HYDRODIURIL) 25 MG tablet Take 1 tablet (25 mg total) by mouth daily. 11/12/17   Shawnee Knapp, MD  imipramine (TOFRANIL) 50 MG tablet Take 1 tablet (50 mg total) by mouth at bedtime. 03/09/17   Shawnee Knapp, MD  Lancets MISC USE TO CHECK [name] SUGAR DAILY. 03/30/13   Shawnee Knapp, MD  metFORMIN (GLUCOPHAGE XR) 500 MG 24 hr tablet Take 3 tablets (1,500 mg total) by mouth daily with breakfast. 03/09/17   Shawnee Knapp,  MD  montelukast (SINGULAIR) 10 MG tablet Take 1 tablet (10 mg total) by mouth at bedtime. 11/12/17   Shawnee Knapp, MD  Vitamin D, Ergocalciferol, (DRISDOL) 50000 units CAPS capsule Take 1 capsule (50,000 Units total) by mouth every 7 (seven) days. 03/10/17   Shawnee Knapp, MD   Allergies  Allergen Reactions  . Darvocet [Propoxyphene N-Acetaminophen] Nausea Only  . Lisinopril Cough  . Losartan Cough   Family History  Problem Relation Age of Onset  . Diabetes Mother   . Hypertension Father   . Heart attack Father   . Heart disease Sister   . Diabetes Sister   . Heart disease Brother   . Diabetes Brother   . Hyperlipidemia Brother   . Stroke Brother   . Colon cancer Neg Hx    Social History   Socioeconomic History  . Marital status: Married    Spouse name: Not on file  . Number of  children: Not on file  . Years of education: Not on file  . Highest education level: Not on file  Occupational History  . Occupation: Occupational psychologist   Social Needs  . Financial resource strain: Not on file  . Food insecurity:    Worry: Not on file    Inability: Not on file  . Transportation needs:    Medical: Not on file    Non-medical: Not on file  Tobacco Use  . Smoking status: Never Smoker  . Smokeless tobacco: Never Used  Substance and Sexual Activity  . Alcohol use: Yes    Alcohol/week: 0.6 oz    Types: 1 Glasses of wine per week    Comment: 1-2 per week  . Drug use: No  . Sexual activity: Yes  Lifestyle  . Physical activity:    Days per week: Not on file    Minutes per session: Not on file  . Stress: Not on file  Relationships  . Social connections:    Talks on phone: Not on file    Gets together: Not on file    Attends religious service: Not on file    Active member of club or organization: Not on file    Attends meetings of clubs or organizations: Not on file    Relationship status: Not on file  Other Topics Concern  . Not on file  Social History Narrative   2 caffeine drinks daily    Depression screen [facility] 2/9 03/25/2018 11/12/2017 03/09/2017 11/15/2015 10/27/2014  Decreased Interest 0 0 0 0 0  Down, Depressed, Hopeless 0 0 0 0 0  PHQ - 2 Score 0 0 0 0 0    Review of Systems  Constitutional: Negative for chills, fatigue, fever and unexpected weight change.  Respiratory: Negative for cough.   Gastrointestinal: Negative for constipation, diarrhea, nausea and vomiting.  Skin: Negative for rash and wound.  Neurological: Negative for dizziness, weakness and headaches.       Objective:   Physical Exam  Constitutional: She is oriented to person, place, and time. She appears well-developed and well-nourished. No distress.  HENT:  Head: Normocephalic and atraumatic.  Eyes: Pupils are equal, round, and reactive to light. EOM are normal.  Neck: Neck supple.    Cardiovascular: Normal rate.  Pulmonary/Chest: Effort normal and breath sounds normal. No respiratory distress.  Musculoskeletal: Normal range of motion.  Neurological: She is alert and oriented to person, place, and time.  Skin: Skin is warm and dry.  Psychiatric: She has a normal mood and affect.  Her behavior is normal.  Nursing note and vitals reviewed.   BP 136/77   Pulse 100   Temp 98.8 F (37.1 C) (Oral)   Resp 16   Ht 5\' 3"  (1.6 m)   Wt 186 lb (84.4 kg)   SpO2 96%   BMI 32.95 kg/m   Results for orders placed or performed in visit on 03/25/18  POCT glycosylated hemoglobin (Hb A1C)  Result Value Ref Range   Hemoglobin A1C 6.9 (A) 4.0 - 5.6 %   HbA1c POC (<> result, manual entry)  4.0 - 5.6 %   HbA1c, POC (prediabetic range)  5.7 - 6.4 %   HbA1c, POC (controlled diabetic range)  0.0 - 7.0 %    Diabetic Foot Exam - Simple   Simple Foot Form Diabetic Foot exam was performed with the following findings:  Yes 03/25/2018  9:55 AM  Visual Inspection No deformities, no ulcerations, no other skin breakdown bilaterally:  Yes Sensation Testing Intact to touch and monofilament testing bilaterally:  Yes Pulse Check Posterior Tibialis and Dorsalis pulse intact bilaterally:  Yes Comments        Assessment & Plan:   1. Type 2 diabetes mellitus without complication, without long-term current use of insulin ([place])   2. Essential hypertension   3. Hyperlipidemia LDL goal <100   4. Vitamin D deficiency   5. Gastroesophageal reflux disease, esophagitis presence not specified   6. Neuropathic pain   7. Screening for colorectal cancer     Orders Placed This Encounter  Procedures  . Comprehensive metabolic panel  . VITAMIN D 25 Hydroxy (Vit-D Deficiency, Fractures)  . Lipid panel    Order Specific Question:   Has the patient fasted?    Answer:   Yes  . Cologuard  . Lipid panel  . POCT glycosylated hemoglobin (Hb A1C)  . HM DIABETES FOOT EXAM    Meds ordered this  encounter  Medications  . amLODipine-olmesartan (AZOR) 10-40 MG tablet    Sig: Take 1 tablet by mouth daily.    Dispense:  90 tablet    Refill:  1  . atorvastatin (LIPITOR) 40 MG tablet    Sig: Take 1 tablet (40 mg total) by mouth daily.    Dispense:  90 tablet    Refill:  3  . esomeprazole (NEXIUM) 40 MG capsule    Sig: Take 1 capsule (40 mg total) by mouth at bedtime.    Dispense:  90 capsule    Refill:  3  . imipramine (TOFRANIL) 50 MG tablet    Sig: Take 1 tablet (50 mg total) by mouth at bedtime.    Dispense:  90 tablet    Refill:  3  . metFORMIN (GLUCOPHAGE XR) 500 MG 24 hr tablet    Sig: Take 3 tablets (1,500 mg total) by mouth daily with breakfast.    Dispense:  270 tablet    Refill:  3   I personally performed the services described in this documentation, which was scribed in my presence. The recorded information has been reviewed and considered, and addended by me as needed.   Delman Cheadle, M.D.  Primary Care at [facility] [address] [place], [place] [address] [phone] phone [phone] fax  06/26/18 11:45 PM

## 2018-03-26 LAB — COMPREHENSIVE METABOLIC PANEL
ALBUMIN: 4.6 g/dL (ref 3.5–5.5)
ALT: 33 IU/L — AB (ref 0–32)
AST: 27 IU/L (ref 0–40)
Albumin/Globulin Ratio: 1.4 (ref 1.2–2.2)
Alkaline Phosphatase: 138 IU/L — ABNORMAL HIGH (ref 39–117)
BILIRUBIN TOTAL: 0.6 mg/dL (ref 0.0–1.2)
BUN / CREAT RATIO: 14 (ref 9–23)
BUN: 10 mg/dL (ref 6–24)
CALCIUM: 9.5 mg/dL (ref 8.7–10.2)
CHLORIDE: 96 mmol/L (ref 96–106)
CO2: 27 mmol/L (ref 20–29)
CREATININE: 0.72 mg/dL (ref 0.57–1.00)
GFR, EST AFRICAN AMERICAN: 108 mL/min/{1.73_m2} (ref >59)
GFR, EST NON AFRICAN AMERICAN: 94 mL/min/{1.73_m2} (ref >59)
GLUCOSE: 131 mg/dL — AB (ref 65–99)
Globulin, Total: 3.4 g/dL (ref 1.5–4.5)
Potassium: 3.3 mmol/L — ABNORMAL LOW (ref 3.5–5.2)
Sodium: 139 mmol/L (ref 134–144)
TOTAL PROTEIN: 8 g/dL (ref 6.0–8.5)

## 2018-03-26 LAB — LIPID PANEL
Chol/HDL Ratio: 4.4 ratio (ref 0.0–4.4)
Cholesterol, Total: 185 mg/dL (ref 100–199)
HDL: 42 mg/dL (ref >39)
LDL CALC: 109 mg/dL — AB (ref 0–99)
Triglycerides: 168 mg/dL — ABNORMAL HIGH (ref 0–149)
VLDL CHOLESTEROL CAL: 34 mg/dL (ref 5–40)

## 2018-03-26 LAB — VITAMIN D 25 HYDROXY (VIT D DEFICIENCY, FRACTURES): VIT D 25 HYDROXY: 24.4 ng/mL — AB (ref 30.0–100.0)

## 2018-07-02 MED FILL — ESOMEPRAZOLE MAG DR 40 MG C: 40 | 90 days supply | Qty: 90 | Fill #1

## 2018-07-02 MED FILL — AMLODIPINE-OLMESARTAN 10-40: 10-40 | 90 days supply | Qty: 90 | Fill #1

## 2018-07-02 MED FILL — FARXIGA 10 MG TABLET: 10 | 90 days supply | Qty: 90 | Fill #2

## 2018-07-22 DIAGNOSIS — Z1231 Encounter for screening mammogram for malignant neoplasm of breast: Secondary | ICD-10-CM | POA: Diagnosis not present

## 2018-07-22 DIAGNOSIS — Z6834 Body mass index (BMI) 34.0-34.9, adult: Secondary | ICD-10-CM | POA: Diagnosis not present

## 2018-07-22 DIAGNOSIS — Z01419 Encounter for gynecological examination (general) (routine) without abnormal findings: Secondary | ICD-10-CM | POA: Diagnosis not present

## 2018-07-22 DIAGNOSIS — Z13820 Encounter for screening for osteoporosis: Secondary | ICD-10-CM | POA: Diagnosis not present

## 2018-07-26 ENCOUNTER — Ambulatory Visit: Payer: 59 | Admitting: Family Medicine

## 2018-07-26 LAB — HM MAMMOGRAPHY

## 2018-07-26 LAB — HM PAP SMEAR: HM Pap smear: NEGATIVE

## 2018-08-06 ENCOUNTER — Telehealth: Payer: Self-pay | Admitting: Family Medicine

## 2018-08-06 NOTE — Telephone Encounter (Signed)
MyChart message sent to patient about rescheduling their appt on 09/09/18 at [facility]

## 2018-09-09 ENCOUNTER — Ambulatory Visit: Payer: 59 | Admitting: Family Medicine

## 2018-09-14 ENCOUNTER — Telehealth: Payer: Self-pay | Admitting: Family Medicine

## 2018-09-14 NOTE — Telephone Encounter (Signed)
MyChart message sent to pt about their appointment on 10/01/18 with Dr Brigitte Pulse

## 2018-09-30 MED FILL — ATORVASTATIN 40 MG TABLET: 40 | 90 days supply | Qty: 90 | Fill #1

## 2018-10-01 ENCOUNTER — Ambulatory Visit: Payer: 59 | Admitting: Family Medicine

## 2018-11-09 ENCOUNTER — Telehealth: Payer: Self-pay | Admitting: Family Medicine

## 2018-11-09 NOTE — Telephone Encounter (Signed)
mychart message sent to pt about their appointment with Dr Shaw °

## 2018-12-10 ENCOUNTER — Ambulatory Visit: Payer: 59 | Admitting: Family Medicine

## 2018-12-31 ENCOUNTER — Telehealth (INDEPENDENT_AMBULATORY_CARE_PROVIDER_SITE_OTHER): Payer: 59 | Admitting: Family Medicine

## 2018-12-31 ENCOUNTER — Other Ambulatory Visit: Payer: Self-pay

## 2018-12-31 DIAGNOSIS — E559 Vitamin D deficiency, unspecified: Secondary | ICD-10-CM

## 2018-12-31 DIAGNOSIS — I10 Essential (primary) hypertension: Secondary | ICD-10-CM

## 2018-12-31 DIAGNOSIS — E119 Type 2 diabetes mellitus without complications: Secondary | ICD-10-CM | POA: Diagnosis not present

## 2018-12-31 DIAGNOSIS — M792 Neuralgia and neuritis, unspecified: Secondary | ICD-10-CM

## 2018-12-31 DIAGNOSIS — K219 Gastro-esophageal reflux disease without esophagitis: Secondary | ICD-10-CM

## 2018-12-31 DIAGNOSIS — E785 Hyperlipidemia, unspecified: Secondary | ICD-10-CM

## 2018-12-31 MED ORDER — ATORVASTATIN CALCIUM 40 MG PO TABS: 40.0000 mg | ORAL_TABLET | Freq: Every day | ORAL | 1 refills | Status: DC

## 2018-12-31 MED ORDER — IMIPRAMINE HCL 50 MG PO TABS: 50.0000 mg | ORAL_TABLET | Freq: Every day | ORAL | 1 refills | Status: DC

## 2018-12-31 MED ORDER — METFORMIN HCL ER 500 MG PO TB24: 1500.0000 mg | ORAL_TABLET | Freq: Every day | ORAL | 1 refills | Status: DC

## 2018-12-31 MED ORDER — ESOMEPRAZOLE MAGNESIUM 40 MG PO CPDR: 40.0000 mg | DELAYED_RELEASE_CAPSULE | Freq: Every day | ORAL | 1 refills | Status: DC

## 2018-12-31 MED ORDER — DAPAGLIFLOZIN PROPANEDIOL 10 MG PO TABS
10.0000 mg | ORAL_TABLET | Freq: Every day | ORAL | 1 refills | Status: DC
Start: 2018-12-31 — End: 2020-06-21

## 2018-12-31 MED ORDER — AMLODIPINE-OLMESARTAN 10-40 MG PO TABS: 1.0000 | ORAL_TABLET | Freq: Every day | ORAL | 1 refills | Status: DC

## 2018-12-31 MED FILL — FARXIGA 10 MG TABLET: 10 | 90 days supply | Qty: 90 | Fill #0

## 2018-12-31 MED FILL — ESOMEPRAZOLE MAG DR 40 MG C: 40 | 90 days supply | Qty: 90 | Fill #0

## 2018-12-31 MED FILL — ATORVASTATIN 40 MG TABLET: 40 | 90 days supply | Qty: 90 | Fill #0

## 2018-12-31 MED FILL — metFORMIN HCL ER 500 MG TB2: 500 | 90 days supply | Qty: 270 | Fill #0

## 2018-12-31 MED FILL — IMIPRAMINE HCL 50 MG TABLET: 50 | 90 days supply | Qty: 90 | Fill #0

## 2018-12-31 MED FILL — AMLODIPINE-OLMESARTAN 10-40: 10-40 | 90 days supply | Qty: 90 | Fill #0

## 2018-12-31 NOTE — Progress Notes (Signed)
Pt wanted to [facility] from Dr. Brigitte Pulse and needs refills on everything. She has been trying to get an appointment and has no luck until just now.    Please call  [phone]  The Home phone number is disconnected.

## 2018-12-31 NOTE — Progress Notes (Signed)
Virtual Visit Note  I connected with patient on 12/31/18 at 910am by telephone due to technical difficulties and verified that I am speaking with the correct person using two identifiers. [name] is currently located at home and patient is currently with them during visit. The provider, Rutherford Guys, MD is located in their office at time of visit.  I discussed the limitations, risks, security and privacy concerns of performing an evaluation and management service by telephone and the availability of in person appointments. I also discussed with the patient that there may be a patient responsible charge related to this service. The patient expressed understanding and agreed to proceed.   CC: medication refills  HPI  58 yo Female with PMH of DM2, HTN, HLP, GERD, vitamin D deficiency and left lower ribs neuropathic pain.?  Previous PCP Dr Brigitte Pulse Last OV July 2019  neuropathic pain -  left lower ribs, imipramine  DM2-  checks cbgs 2-3 a month Metformin and farxiga Denies any side effects Denies any polydipisia, polyuria, numbness, tingling of her feet, denies any recurrent UTIs, denies any vision changes Denies any h/o retinopathy Thinks she has gained about 5-6 lbs  HTN - Checks BP sporadically Takes amlodipine and olmesartan Denies any side effects Last check 2 weeks ago, 130/82 Denies dizziness, headaches, cp, palpitations, sob, cough, edema Tries to follow a low salt diet  HLP-  Takes atorvastatin Denies any side effects Denies any myalgia  GERD with HH - Takes nexium Well controlled EGD in 2012 and 2104 - both requiring esophageal dilatation  Vitamin D- She completed a high dose course for 26 weeks Last checked by gyn, sept-oct, told level was better Now on caltrate Also had dexa bone by gyn, patient does not recall results Dr Arvella Nigh, [facility]  Lab Results  Component Value Date   HGBA1C 6.9 (A) 03/25/2018   HGBA1C 7.3 11/12/2017   HGBA1C 6.6 07/13/2017   Lab Results  Component Value Date   MICROALBUR 0.7 11/15/2015   [place] 109 (H) 03/25/2018   CREATININE 0.72 03/25/2018    Allergies  Allergen Reactions  . Darvocet [Propoxyphene N-Acetaminophen] Nausea Only  . Lisinopril Cough  . Losartan Cough    Prior to Admission medications   Medication Sig Start Date End Date Taking? Authorizing Provider  amLODipine-olmesartan (AZOR) 10-40 MG tablet Take 1 tablet by mouth daily. 03/25/18  Yes Shawnee Knapp, MD  aspirin 81 MG tablet Take 81 mg by mouth daily.     Yes [provider]  atorvastatin (LIPITOR) 40 MG tablet Take 1 tablet (40 mg total) by mouth daily. 03/25/18  Yes Shawnee Knapp, MD  Calcium-Vitamin D (CALTRATE 600 PLUS-VIT D PO) Take 1 tablet by mouth 2 (two) times daily.     Yes [provider]  dapagliflozin propanediol (FARXIGA) 10 MG TABS tablet Take 10 mg by mouth daily. 11/12/17  Yes Shawnee Knapp, MD  esomeprazole (NEXIUM) 40 MG capsule Take 1 capsule (40 mg total) by mouth at bedtime. 03/25/18  Yes Shawnee Knapp, MD  glucose blood test strip Use to check blood sugar daily. 03/30/13  Yes Shawnee Knapp, MD  imipramine (TOFRANIL) 50 MG tablet Take 1 tablet (50 mg total) by mouth at bedtime. 03/25/18  Yes Shawnee Knapp, MD  Lancets MISC USE TO CHECK BLOOD SUGAR DAILY. 03/30/13  Yes Shawnee Knapp, MD  metFORMIN (GLUCOPHAGE XR) 500 MG 24 hr tablet Take 3 tablets (1,500 mg total) by mouth  daily with breakfast. 03/25/18  Yes Shawnee Knapp, MD  montelukast (SINGULAIR) 10 MG tablet Take 1 tablet (10 mg total) by mouth at bedtime. Patient not taking: Reported on 12/31/2018 11/12/17   Shawnee Knapp, MD    Past Medical History:  Diagnosis Date  . Diabetes mellitus without complication ([place])   . Endometriosis   . Fibroids    uterine  . GERD (gastroesophageal reflux disease)   . 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 History of hiatal hernia   .  History of kidney stones 2014, 2017  . Hyperlipidemia   . Hypertension   . Neuromuscular disorder ([place]) ~2000   "exposed nerve" Lt flank  . Obesity (BMI 30-39.9)   . Peptic stricture of esophagus   . Uterine fibroid     Past Surgical History:  Procedure Laterality Date  . ABDOMINAL HYSTERECTOMY  2008  . BREAST CYST EXCISION  2007  . EXPLORATORY LAPAROTOMY  ~2005  . MASS EXCISION Left 07/11/2016   Procedure: EXCISION MASS LEFT UPPER ARM;  Surgeon: Armandina Gemma, MD;  Location: [facility];  Service: General;  Laterality: Left;    Social History   Tobacco Use  . Smoking status: Never Smoker  . Smokeless tobacco: Never Used  Substance Use Topics  . Alcohol use: Yes    Alcohol/week: 1.0 standard drinks    Types: 1 Glasses of wine per week    Comment: 1-2 per week    Family History  Problem Relation Age of Onset  . Diabetes Mother   . Hypertension Father   . Heart attack Father   . Heart disease Sister   . Diabetes Sister   . Heart disease Brother   . Diabetes Brother   . Hyperlipidemia Brother   . Stroke Brother   . Colon cancer Neg Hx     ROS Per hpi  Objective  Vitals as reported by the patient: as above   ASSESSMENT and PLAN  1. Neuropathic pain Controlled. Continue current regime.  - imipramine (TOFRANIL) 50 MG tablet; Take 1 tablet (50 mg total) by mouth at bedtime.  2. Gastroesophageal reflux disease, esophagitis presence not specified Controlled. Continue current regime.  - esomeprazole (NEXIUM) 40 MG capsule; Take 1 capsule (40 mg total) by mouth at bedtime.  3. Type 2 diabetes mellitus without complication, without long-term current use of insulin ([facility]) Checking labs, medications will be adjusted as needed. LFM recommendations discussed. - Hemoglobin A1c; Future - Comprehensive metabolic panel; Future - Microalbumin/Creatinine Ratio, Urine; Future  4. Essential hypertension Controlled. Continue current regime.  - CBC; Future -  Comprehensive metabolic panel; Future  5. Hyperlipidemia LDL goal <100 Checking labs, medications will be adjusted as needed.  - Comprehensive metabolic panel; Future - TSH; Future - Lipid panel; Future  6. Vitamin D deficiency Checking labs, medications will be adjusted as needed.  - Vitamin D, 25-hydroxy; Future  Other orders - metFORMIN (GLUCOPHAGE XR) 500 MG 24 hr tablet; Take 3 tablets (1,500 mg total) by mouth daily with breakfast. - dapagliflozin propanediol (FARXIGA) 10 MG TABS tablet; Take 10 mg by mouth daily. - atorvastatin (LIPITOR) 40 MG tablet; Take 1 tablet (40 mg total) by mouth daily. - amLODipine-olmesartan (AZOR) 10-40 MG tablet; Take 1 tablet by mouth daily.  FOLLOW-UP: labs within 1 week, will request records from gyn, followup with me in 3 months   The above assessment and management plan  was discussed with the patient. The patient verbalized understanding of and has agreed to the management plan. Patient is aware to call the clinic if symptoms persist or worsen. Patient is aware when to return to the clinic for a follow-up visit. Patient educated on when it is appropriate to go to the emergency department.    I provided 25 minutes of non-face-to-face time during this encounter.  Rutherford Guys, MD Primary Care at [place], [place] [address] Ph.  [phone] Fax [phone]

## 2019-02-16 DIAGNOSIS — L720 Epidermal cyst: Secondary | ICD-10-CM | POA: Diagnosis not present

## 2019-02-16 MED FILL — DOXYCYCLINE HYC 100 MG CAPS: 100 | 14 days supply | Qty: 28 | Fill #0

## 2019-03-14 DIAGNOSIS — H52223 Regular astigmatism, bilateral: Secondary | ICD-10-CM | POA: Diagnosis not present

## 2019-03-14 DIAGNOSIS — H524 Presbyopia: Secondary | ICD-10-CM | POA: Diagnosis not present

## 2019-03-14 DIAGNOSIS — E119 Type 2 diabetes mellitus without complications: Secondary | ICD-10-CM | POA: Diagnosis not present

## 2019-03-14 DIAGNOSIS — H5203 Hypermetropia, bilateral: Secondary | ICD-10-CM | POA: Diagnosis not present

## 2019-03-14 LAB — HM DIABETES EYE EXAM

## 2019-04-15 ENCOUNTER — Ambulatory Visit: Payer: 59

## 2019-04-15 ENCOUNTER — Ambulatory Visit: Payer: 59 | Admitting: Family Medicine

## 2019-04-19 ENCOUNTER — Other Ambulatory Visit: Payer: Self-pay

## 2019-04-19 ENCOUNTER — Ambulatory Visit: Payer: 59

## 2019-04-19 DIAGNOSIS — E119 Type 2 diabetes mellitus without complications: Secondary | ICD-10-CM

## 2019-04-19 DIAGNOSIS — E785 Hyperlipidemia, unspecified: Secondary | ICD-10-CM | POA: Diagnosis not present

## 2019-04-19 DIAGNOSIS — E559 Vitamin D deficiency, unspecified: Secondary | ICD-10-CM | POA: Diagnosis not present

## 2019-04-19 DIAGNOSIS — I10 Essential (primary) hypertension: Secondary | ICD-10-CM | POA: Diagnosis not present

## 2019-04-20 LAB — LIPID PANEL
Chol/HDL Ratio: 5.6 ratio — ABNORMAL HIGH (ref 0.0–4.4)
Cholesterol, Total: 237 mg/dL — ABNORMAL HIGH (ref 100–199)
HDL: 42 mg/dL (ref >39)
LDL Calculated: 160 mg/dL — ABNORMAL HIGH (ref 0–99)
Triglycerides: 174 mg/dL — ABNORMAL HIGH (ref 0–149)
VLDL Cholesterol Cal: 35 mg/dL (ref 5–40)

## 2019-04-20 LAB — CBC
Hematocrit: 40.5 % (ref 34.0–46.6)
Hemoglobin: 13.2 g/dL (ref 11.1–15.9)
MCH: 26.5 pg — ABNORMAL LOW (ref 26.6–33.0)
MCHC: 32.6 g/dL (ref 31.5–35.7)
MCV: 81 fL (ref 79–97)
Platelets: 364 10*3/uL (ref 150–450)
RBC: 4.99 x10E6/uL (ref 3.77–5.28)
RDW: 14.1 % (ref 11.7–15.4)
WBC: 7.9 10*3/uL (ref 3.4–10.8)

## 2019-04-20 LAB — COMPREHENSIVE METABOLIC PANEL
ALT: 33 IU/L — ABNORMAL HIGH (ref 0–32)
AST: 24 IU/L (ref 0–40)
Albumin/Globulin Ratio: 1.3 (ref 1.2–2.2)
Albumin: 4.7 g/dL (ref 3.8–4.9)
Alkaline Phosphatase: 138 IU/L — ABNORMAL HIGH (ref 39–117)
BUN/Creatinine Ratio: 16 (ref 9–23)
BUN: 11 mg/dL (ref 6–24)
Bilirubin Total: 0.6 mg/dL (ref 0.0–1.2)
CO2: 25 mmol/L (ref 20–29)
Calcium: 9.8 mg/dL (ref 8.7–10.2)
Chloride: 99 mmol/L (ref 96–106)
Creatinine, Ser: 0.69 mg/dL (ref 0.57–1.00)
GFR calc Af Amer: 112 mL/min/{1.73_m2} (ref >59)
GFR calc non Af Amer: 97 mL/min/{1.73_m2} (ref >59)
Globulin, Total: 3.6 g/dL (ref 1.5–4.5)
Glucose: 137 mg/dL — ABNORMAL HIGH (ref 65–99)
Potassium: 4.2 mmol/L (ref 3.5–5.2)
Sodium: 140 mmol/L (ref 134–144)
Total Protein: 8.3 g/dL (ref 6.0–8.5)

## 2019-04-20 LAB — MICROALBUMIN / CREATININE URINE RATIO
Creatinine, Urine: 81 mg/dL
Microalb/Creat Ratio: 214 mg/g creat — ABNORMAL HIGH (ref 0–29)
Microalbumin, Urine: 173.1 ug/mL

## 2019-04-20 LAB — TSH: TSH: 1.78 u[IU]/mL (ref 0.450–4.500)

## 2019-04-20 LAB — HEMOGLOBIN A1C
Est. average glucose Bld gHb Est-mCnc: 177 mg/dL
Hgb A1c MFr Bld: 7.8 % — ABNORMAL HIGH (ref 4.8–5.6)

## 2019-04-20 LAB — VITAMIN D 25 HYDROXY (VIT D DEFICIENCY, FRACTURES): Vit D, 25-Hydroxy: 21.5 ng/mL — ABNORMAL LOW (ref 30.0–100.0)

## 2019-04-22 ENCOUNTER — Ambulatory Visit: Payer: 59 | Admitting: Family Medicine

## 2019-05-02 ENCOUNTER — Encounter: Payer: Self-pay | Admitting: Family Medicine

## 2019-05-02 ENCOUNTER — Other Ambulatory Visit: Payer: Self-pay | Admitting: Family Medicine

## 2019-05-02 MED ORDER — ATORVASTATIN CALCIUM 40 MG PO TABS: 40.0000 mg | ORAL_TABLET | Freq: Every day | ORAL | 1 refills | Status: DC

## 2019-05-02 MED FILL — ATORVASTATIN 40 MG TABLET: 40 | 90 days supply | Qty: 90 | Fill #0

## 2019-06-15 MED FILL — AMLODIPINE-OLMESARTAN 10-40: 10-40 | 90 days supply | Qty: 90 | Fill #0

## 2019-06-15 MED FILL — IMIPRAMINE HCL 50 MG TABLET: 50 | 90 days supply | Qty: 90 | Fill #0

## 2019-06-15 MED FILL — ESOMEPRAZOLE MAG DR 40 MG C: 40 | 90 days supply | Qty: 90 | Fill #0

## 2019-06-15 MED FILL — FARXIGA 10 MG TABLET: 10 | 30 days supply | Qty: 30 | Fill #0

## 2019-06-15 MED FILL — metFORMIN HCL ER 500 MG TB2: 500 | 90 days supply | Qty: 270 | Fill #0

## 2019-11-30 MED FILL — ATORVASTATIN 40 MG TABLET: 40 | 90 days supply | Qty: 90 | Fill #1

## 2019-11-30 MED FILL — FARXIGA 10 MG TABLET: 10 | 30 days supply | Qty: 30 | Fill #1

## 2020-01-02 ENCOUNTER — Ambulatory Visit: Payer: 59 | Admitting: Podiatry

## 2020-01-02 ENCOUNTER — Ambulatory Visit (INDEPENDENT_AMBULATORY_CARE_PROVIDER_SITE_OTHER): Payer: 59

## 2020-01-02 ENCOUNTER — Other Ambulatory Visit: Payer: Self-pay

## 2020-01-02 DIAGNOSIS — M7752 Other enthesopathy of left foot: Secondary | ICD-10-CM

## 2020-01-02 MED ORDER — MELOXICAM 15 MG PO TABS: 15.0000 mg | ORAL_TABLET | Freq: Every day | ORAL | 1 refills | Status: DC

## 2020-01-02 MED FILL — MELOXICAM 15 MG TABLET: 15 | 30 days supply | Qty: 30 | Fill #0

## 2020-01-05 NOTE — Progress Notes (Signed)
   HPI: 59 y.o. female presenting today with a chief complaint of mild pain to the left great toe that began about two weeks ago. She reports associated redness of the toe. Applying pressure to the toe increases the pain. She has not had any treatment for the symptoms. Patient is here for further evaluation and treatment.   Past Medical History:  Diagnosis Date  . Diabetes mellitus without complication ([place])   . Endometriosis   . Fibroids    uterine  . GERD (gastroesophageal reflux disease)   . 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 History of hiatal hernia   . History of kidney stones 2014, 2017  . Hyperlipidemia   . Hypertension   . Neuromuscular disorder ([place]) ~2000   "exposed nerve" Lt flank  . Obesity (BMI 30-39.9)   . Peptic stricture of esophagus   . Uterine fibroid      Physical Exam: General: The patient is alert and oriented x3 in no acute distress.  Dermatology: Skin is warm, dry and supple bilateral lower extremities. Negative for open lesions or macerations.  Vascular: Palpable pedal pulses bilaterally. No edema or erythema noted. Capillary refill within normal limits.  Neurological: Epicritic and protective threshold grossly intact bilaterally.   Musculoskeletal Exam: Pain with palpation noted to the 1st MPJ of the left foot. Range of motion within normal limits to all pedal and ankle joints bilateral. Muscle strength 5/5 in all groups bilateral.   Radiographic Exam:  Normal osseous mineralization. Joint spaces preserved. No fracture/dislocation/boney destruction.    Assessment: 1. 1st MPJ capsulitis left    Plan of Care:  1. Patient evaluated. X-Rays reviewed.  2. Injection of 0.5 mLs [place] Soluspan injected into the 1st MPJ of the left foot.  3. Prescription for Meloxicam provided to patient. 4. Recommended post op shoe as needed. Patient has one at home.  5. Return to clinic  as needed.   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 Jennifer's friend.      Edrick Kins, DPM [facility]  Dr. Edrick Kins, DPM    [address], [place] [address]                Office [phone]  Fax [phone]

## 2020-02-13 ENCOUNTER — Other Ambulatory Visit: Payer: Self-pay | Admitting: Family Medicine

## 2020-02-13 NOTE — Telephone Encounter (Signed)
Requested medication (s) are due for refill today: yes  Requested medication (s) are on the active medication list: yes  Last refill: 11/30/19  Future visit scheduled: no  Notes to clinic:  no valid encounter within last 6 months    Requested Prescriptions  Pending Prescriptions Disp Refills   FARXIGA 10 MG TABS tablet [Pharmacy Med Name: FARXIGA 10 MG TABLET 10 Tablet] 30 tablet 0    Sig: TAKE 1 [place] A DAY      Endocrinology:  Diabetes - SGLT2 Inhibitors Failed - 02/13/2020  2:48 PM      Failed - LDL in normal range and within 360 days    LDL Calculated  Date Value Ref Range Status  04/19/2019 160 (H) 0 - 99 mg/dL Final   Direct LDL  Date Value Ref Range Status  08/26/2013 123 (H) mg/dL Final    Comment:    ATP III Classification (LDL):       < 100        mg/dL         Optimal      100 - 129     mg/dL         Near or Above Optimal      130 - 159     mg/dL         Borderline High      160 - 189     mg/dL         High       > 190        mg/dL         Very High            Failed - HBA1C is between 0 and 7.9 and within 180 days    Hgb A1c MFr Bld  Date Value Ref Range Status  04/19/2019 7.8 (H) 4.8 - 5.6 % Final    Comment:             Prediabetes: 5.7 - 6.4          Diabetes: >6.4          Glycemic control for adults with diabetes: <7.0           Failed - Valid encounter within last 6 months    Recent Outpatient Visits           1 year ago Type 2 diabetes mellitus without complication, without long-term current use of insulin ([place])   Primary Care at [facility], Lilia Argue, MD   1 year ago Type 2 diabetes mellitus without complication, without long-term current use of insulin [facility])   Primary Care at [facility], Laurey Arrow, MD   2 years ago Type 2 diabetes mellitus without complication, without long-term current use of insulin [facility])   Primary Care at [facility], Laurey Arrow, MD   2 years ago Type 2 diabetes mellitus without complication, without long-term  current use of insulin [facility])   Primary Care at [facility], Laurey Arrow, MD   2 years ago Type 2 diabetes mellitus without complication, without long-term current use of insulin [facility])   Primary Care at [facility], Laurey Arrow, MD              Passed - Cr in normal range and within 360 days    Creat  Date Value Ref Range Status  11/15/2015 0.56 0.50 - 1.05 mg/dL Final   Creatinine, Ser  Date Value Ref Range Status  04/19/2019  0.69 0.57 - 1.00 mg/dL Final   Creatinine, Urine  Date Value Ref Range Status  11/15/2015 112 20 - 320 mg/dL Final          Passed - eGFR in normal range and within 360 days    GFR, Est African American  Date Value Ref Range Status  10/27/2014 >89 mL/min Final   GFR calc Af Amer  Date Value Ref Range Status  04/19/2019 112 >59 mL/min/1.73 Final   GFR, Est Non African American  Date Value Ref Range Status  10/27/2014 >89 mL/min Final    Comment:      The estimated GFR is a calculation valid for adults (>=66 years old) that uses the CKD-EPI algorithm to adjust for age and sex. It is   not to be used for children, pregnant women, hospitalized patients,    patients on dialysis, or with rapidly changing kidney function. According to the NKDEP, eGFR >89 is normal, 60-89 shows mild impairment, 30-59 shows moderate impairment, 15-29 shows severe impairment and <15 is ESRD.      GFR calc non Af Amer  Date Value Ref Range Status  04/19/2019 97 >59 mL/min/1.73 Final

## 2020-06-15 ENCOUNTER — Ambulatory Visit: Payer: 59 | Admitting: Family Medicine

## 2020-06-21 ENCOUNTER — Other Ambulatory Visit: Payer: Self-pay

## 2020-06-21 ENCOUNTER — Encounter: Payer: Self-pay | Admitting: Family Medicine

## 2020-06-21 ENCOUNTER — Other Ambulatory Visit: Payer: Self-pay | Admitting: Family Medicine

## 2020-06-21 ENCOUNTER — Ambulatory Visit (INDEPENDENT_AMBULATORY_CARE_PROVIDER_SITE_OTHER): Payer: 59 | Admitting: Family Medicine

## 2020-06-21 VITALS — BP 124/76 | HR 109 | Temp 97.2°F | Ht 63.25 in | Wt 187.6 lb

## 2020-06-21 DIAGNOSIS — K219 Gastro-esophageal reflux disease without esophagitis: Secondary | ICD-10-CM

## 2020-06-21 DIAGNOSIS — Z0000 Encounter for general adult medical examination without abnormal findings: Secondary | ICD-10-CM | POA: Diagnosis not present

## 2020-06-21 DIAGNOSIS — E559 Vitamin D deficiency, unspecified: Secondary | ICD-10-CM | POA: Diagnosis not present

## 2020-06-21 DIAGNOSIS — E785 Hyperlipidemia, unspecified: Secondary | ICD-10-CM | POA: Diagnosis not present

## 2020-06-21 DIAGNOSIS — Z23 Encounter for immunization: Secondary | ICD-10-CM

## 2020-06-21 DIAGNOSIS — I10 Essential (primary) hypertension: Secondary | ICD-10-CM

## 2020-06-21 DIAGNOSIS — E119 Type 2 diabetes mellitus without complications: Secondary | ICD-10-CM | POA: Diagnosis not present

## 2020-06-21 DIAGNOSIS — M792 Neuralgia and neuritis, unspecified: Secondary | ICD-10-CM

## 2020-06-21 LAB — BASIC METABOLIC PANEL WITH GFR
BUN: 7 mg/dL (ref 6–23)
CO2: 26 meq/L (ref 19–32)
Calcium: 9.4 mg/dL (ref 8.4–10.5)
Chloride: 103 meq/L (ref 96–112)
Creatinine, Ser: 0.55 mg/dL (ref 0.40–1.20)
GFR: 102.84 mL/min
Glucose, Bld: 166 mg/dL — ABNORMAL HIGH (ref 70–99)
Potassium: 3.9 meq/L (ref 3.5–5.1)
Sodium: 140 meq/L (ref 135–145)

## 2020-06-21 LAB — LIPID PANEL
Cholesterol: 159 mg/dL (ref 0–200)
HDL: 40 mg/dL (ref >39.00)
LDL Cholesterol: 82 mg/dL (ref 0–99)
NonHDL: 119.1
Total CHOL/HDL Ratio: 4
Triglycerides: 184 mg/dL — ABNORMAL HIGH (ref 0.0–149.0)
VLDL: 36.8 mg/dL (ref 0.0–40.0)

## 2020-06-21 LAB — MICROALBUMIN / CREATININE URINE RATIO
Creatinine,U: 61.6 mg/dL
Microalb Creat Ratio: 18.8 mg/g (ref 0.0–30.0)
Microalb, Ur: 11.5 mg/dL — ABNORMAL HIGH (ref 0.0–1.9)

## 2020-06-21 LAB — HEMOGLOBIN A1C: Hgb A1c MFr Bld: 10.5 % — ABNORMAL HIGH (ref 4.6–6.5)

## 2020-06-21 LAB — AST: AST: 22 U/L (ref 0–37)

## 2020-06-21 LAB — VITAMIN D 25 HYDROXY (VIT D DEFICIENCY, FRACTURES): VITD: 10.62 ng/mL — ABNORMAL LOW (ref 30.00–100.00)

## 2020-06-21 LAB — ALT: ALT: 30 U/L (ref 0–35)

## 2020-06-21 MED ORDER — DAPAGLIFLOZIN PROPANEDIOL 10 MG PO TABS: 10.0000 mg | ORAL_TABLET | Freq: Every day | ORAL | 3 refills | Status: DC

## 2020-06-21 MED ORDER — METFORMIN HCL ER 500 MG PO TB24: 1500.0000 mg | ORAL_TABLET | Freq: Every day | ORAL | 1 refills | Status: DC

## 2020-06-21 MED ORDER — ATORVASTATIN CALCIUM 40 MG PO TABS: 40.0000 mg | ORAL_TABLET | Freq: Every day | ORAL | 3 refills | Status: DC

## 2020-06-21 MED ORDER — AMLODIPINE-OLMESARTAN 10-40 MG PO TABS: 1.0000 | ORAL_TABLET | Freq: Every day | ORAL | 3 refills | Status: DC

## 2020-06-21 MED ORDER — IMIPRAMINE HCL 50 MG PO TABS: 50.0000 mg | ORAL_TABLET | Freq: Every day | ORAL | 3 refills | Status: DC

## 2020-06-21 MED ORDER — FREESTYLE SYSTEM KIT: 1.0000 | PACK | 0 refills | Status: DC | PRN

## 2020-06-21 MED ORDER — ESOMEPRAZOLE MAGNESIUM 40 MG PO CPDR: 40.0000 mg | DELAYED_RELEASE_CAPSULE | Freq: Every day | ORAL | 3 refills | Status: DC

## 2020-06-21 MED FILL — IMIPRAMINE HCL 50 MG TABLET: 50 | 90 days supply | Qty: 90 | Fill #0

## 2020-06-21 MED FILL — METFORMIN HCL ER 500 MG TB2: 500 | 90 days supply | Qty: 270 | Fill #0

## 2020-06-21 MED FILL — FARXIGA 10 MG TABLET: 10 | 90 days supply | Qty: 90 | Fill #0

## 2020-06-21 MED FILL — ATORVASTATIN 40 MG TABLET: 40 | 90 days supply | Qty: 90 | Fill #0

## 2020-06-21 MED FILL — AMLODIPINE-OLMESARTAN 10-40: 10-40 | 90 days supply | Qty: 90 | Fill #0

## 2020-06-21 MED FILL — ESOMEPRAZOLE MAG DR 40 MG C: 40 | 90 days supply | Qty: 90 | Fill #0

## 2020-06-21 NOTE — Patient Instructions (Signed)
Health Maintenance, Female Adopting a healthy lifestyle and getting preventive care are important in promoting health and wellness. Ask your health care provider about:  The right schedule for you to have regular tests and exams.  Things you can do on your own to prevent diseases and keep yourself healthy. What should I know about diet, weight, and exercise? Eat a healthy diet   Eat a diet that includes plenty of vegetables, fruits, low-fat dairy products, and lean protein.  Do not eat a lot of foods that are high in solid fats, added sugars, or sodium. Maintain a healthy weight Body mass index (BMI) is used to identify weight problems. It estimates body fat based on height and weight. Your health care provider can help determine your BMI and help you achieve or maintain a healthy weight. Get regular exercise Get regular exercise. This is one of the most important things you can do for your health. Most adults should:  Exercise for at least 150 minutes each week. The exercise should increase your heart rate and make you sweat (moderate-intensity exercise).  Do strengthening exercises at least twice a week. This is in addition to the moderate-intensity exercise.  Spend less time sitting. Even light physical activity can be beneficial. Watch cholesterol and blood lipids Have your blood tested for lipids and cholesterol at 58 years of age, then have this test every 5 years. Have your cholesterol levels checked more often if:  Your lipid or cholesterol levels are high.  You are older than 58 years of age.  You are at high risk for heart disease. What should I know about cancer screening? Depending on your health history and family history, you may need to have cancer screening at various ages. This may include screening for:  Breast cancer.  Cervical cancer.  Colorectal cancer.  Skin cancer.  Lung cancer. What should I know about heart disease, diabetes, and high blood  pressure? Blood pressure and heart disease  High blood pressure causes heart disease and increases the risk of stroke. This is more likely to develop in people who have high blood pressure readings, are of African descent, or are overweight.  Have your blood pressure checked: ? Every 3-5 years if you are 18-39 years of age. ? Every year if you are 40 years old or older. Diabetes Have regular diabetes screenings. This checks your fasting blood sugar level. Have the screening done:  Once every three years after age 40 if you are at a normal weight and have a low risk for diabetes.  More often and at a younger age if you are overweight or have a high risk for diabetes. What should I know about preventing infection? Hepatitis B If you have a higher risk for hepatitis B, you should be screened for this virus. Talk with your health care provider to find out if you are at risk for hepatitis B infection. Hepatitis C Testing is recommended for:  Everyone born from 1945 through 1965.  Anyone with known risk factors for hepatitis C. Sexually transmitted infections (STIs)  Get screened for STIs, including gonorrhea and chlamydia, if: ? You are sexually active and are younger than 58 years of age. ? You are older than 58 years of age and your health care provider tells you that you are at risk for this type of infection. ? Your sexual activity has changed since you were last screened, and you are at increased risk for chlamydia or gonorrhea. Ask your health care provider if   you are at risk.  Ask your health care provider about whether you are at high risk for HIV. Your health care provider may recommend a prescription medicine to help prevent HIV infection. If you choose to take medicine to prevent HIV, you should first get tested for HIV. You should then be tested every 3 months for as long as you are taking the medicine. Pregnancy  If you are about to stop having your period (premenopausal) and  you may become pregnant, seek counseling before you get pregnant.  Take 400 to 800 micrograms (mcg) of folic acid every day if you become pregnant.  Ask for birth control (contraception) if you want to prevent pregnancy. Osteoporosis and menopause Osteoporosis is a disease in which the bones lose minerals and strength with aging. This can result in bone fractures. If you are 65 years old or older, or if you are at risk for osteoporosis and fractures, ask your health care provider if you should:  Be screened for bone loss.  Take a calcium or vitamin D supplement to lower your risk of fractures.  Be given hormone replacement therapy (HRT) to treat symptoms of menopause. Follow these instructions at home: Lifestyle  Do not use any products that contain nicotine or tobacco, such as cigarettes, e-cigarettes, and chewing tobacco. If you need help quitting, ask your health care provider.  Do not use street drugs.  Do not share needles.  Ask your health care provider for help if you need support or information about quitting drugs. Alcohol use  Do not drink alcohol if: ? Your health care provider tells you not to drink. ? You are pregnant, may be pregnant, or are planning to become pregnant.  If you drink alcohol: ? Limit how much you use to 0-1 drink a day. ? Limit intake if you are breastfeeding.  Be aware of how much alcohol is in your drink. In the [place] one drink equals one 12 oz bottle of beer (355 mL), one 5 oz glass of wine (148 mL), or one 1 oz glass of hard liquor (44 mL). General instructions  Schedule regular health, dental, and eye exams.  Stay current with your vaccines.  Tell your health care provider if: ? You often feel depressed. ? You have ever been abused or do not feel safe at home. Summary  Adopting a healthy lifestyle and getting preventive care are important in promoting health and wellness.  Follow your health care provider's instructions about healthy  diet, exercising, and getting tested or screened for diseases.  Follow your health care provider's instructions on monitoring your cholesterol and blood pressure. This information is not intended to replace advice given to you by your health care provider. Make sure you discuss any questions you have with your health care provider. Document Revised: 08/18/2018 Document Reviewed: 08/18/2018 Elsevier Patient Education  2020 Elsevier Inc.  

## 2020-06-21 NOTE — Progress Notes (Signed)
[name] is a 59 y.o. female  Chief Complaint  Patient presents with  . Establish Care    NP- CPE/labs, fasting this am. no concerns.      HPI: [name] is a 59 y.o. female here as a new patient to our office, previous PCP Dr. Grant Fontana, to establish care and for annual CPE, labs. Pt has a PMHx significant for DM, HTN, HLD, Vit D deficiency, GERD,esophageal stricture (dilated in 2012, 2014) obesity.   Last PAP: s/p TAH Last mammo: 2020 - done with [facility] Dr. Radene Knee Last colonoscopy: 2006 - due in 2016 (Dr. Henrene Pastor w/ [facility])  Dental: UTD goes every 6 mo Vision: due, last seen 1 year ago  She had covid vaccine. She will get flu vaccine at work  She needs all meds refilled and new Rx for glucometer.  Past Medical History:  Diagnosis Date  . Diabetes mellitus without complication ([place])   . Endometriosis   . Fibroids    uterine  . GERD (gastroesophageal reflux disease)   . 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 History of hiatal hernia   . History of kidney stones 2014, 2017  . Hyperlipidemia   . Hypertension   . 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 Obesity (BMI 30-39.9)   . Peptic stricture of esophagus   . Uterine fibroid     Past Surgical History:  Procedure Laterality Date  . ABDOMINAL HYSTERECTOMY  2008  . BREAST CYST EXCISION  2007  . EXPLORATORY LAPAROTOMY  ~2005  . MASS EXCISION Left 07/11/2016   Procedure: EXCISION MASS LEFT UPPER ARM;  Surgeon: Armandina Gemma, MD;  Location: [facility];  Service: General;  Laterality: Left;    Social History   Socioeconomic History  . Marital status: Married    Spouse name: Not on file  . Number of children: Not on file  . Years of education: Not on file  . Highest education level: Not on file  Occupational History  . Occupation: Occupational psychologist   Tobacco Use  . Smoking status:  Never Smoker  . Smokeless tobacco: Never Used  Vaping Use  . Vaping Use: Never used  Substance and Sexual Activity  . Alcohol use: Yes    Alcohol/week: 1.0 standard drink    Types: 1 Glasses of wine per week    Comment: 1-2 per week  . Drug use: No  . Sexual activity: Yes  Other Topics Concern  . Not on file  Social History Narrative   2 caffeine drinks daily    Social Determinants of Health   Financial Resource Strain:   . Difficulty of Paying Living Expenses: Not on file  Food Insecurity:   . Worried About Charity fundraiser in the Last Year: Not on file  . Ran Out of Food in the Last Year: Not on file  Transportation Needs:   . Lack of Transportation (Medical): Not on file  . Lack of Transportation (Non-Medical): Not on file  Physical Activity:   . Days of Exercise per Week: Not on file  . Minutes of Exercise per Session: Not on file  Stress:   . Feeling of Stress : Not on file  Social Connections:   . Frequency of Communication with Friends and Family: Not on file  . Frequency of Social Gatherings with Friends and Family: Not  on file  . Attends Religious Services: Not on file  . Active Member of Clubs or Organizations: Not on file  . Attends Archivist Meetings: Not on file  . Marital Status: Not on file  Intimate Partner Violence:   . Fear of Current or Ex-Partner: Not on file  . Emotionally Abused: Not on file  . Physically Abused: Not on file  . Sexually Abused: Not on file    Family History  Problem Relation Age of Onset  . Diabetes Mother   . Hypertension Father   . Heart attack Father   . Heart disease Sister   . Diabetes Sister   . Heart disease Brother   . Diabetes Brother   . Hyperlipidemia Brother   . Stroke Brother   . Colon cancer Neg Hx      Immunization History  Administered Date(s) Administered  . Influenza Split 06/22/2013, 06/08/2014  . PFIZER SARS-COV-2 Vaccination 08/24/2019, 09/21/2019  . Pneumococcal Polysaccharide-23  11/15/2015  . Tdap 09/17/2012  . Zoster Recombinat (Shingrix) 06/21/2020    Outpatient Encounter Medications as of 06/21/2020  Medication Sig  . amLODipine-olmesartan (AZOR) 10-40 MG tablet Take 1 tablet by mouth daily.  Marland Kitchen aspirin 81 MG tablet Take 81 mg by mouth daily.    Marland Kitchen atorvastatin (LIPITOR) 40 MG tablet Take 1 tablet (40 mg total) by mouth daily.  . Calcium-Vitamin D (CALTRATE 600 PLUS-VIT D PO) Take 1 tablet by mouth 2 (two) times daily.    . dapagliflozin propanediol (FARXIGA) 10 MG TABS tablet Take 10 mg by mouth daily.  Marland Kitchen esomeprazole (NEXIUM) 40 MG capsule Take 1 capsule (40 mg total) by mouth at bedtime.  Marland Kitchen glucose blood test strip Use to check blood sugar daily.  Marland Kitchen imipramine (TOFRANIL) 50 MG tablet Take 1 tablet (50 mg total) by mouth at bedtime.  . Lancets MISC USE TO CHECK BLOOD SUGAR DAILY.  . metFORMIN (GLUCOPHAGE XR) 500 MG 24 hr tablet Take 3 tablets (1,500 mg total) by mouth daily with breakfast.  . [DISCONTINUED] meloxicam (MOBIC) 15 MG tablet Take 1 tablet (15 mg total) by mouth daily.   No facility-administered encounter medications on file as of 06/21/2020.      ROS: Gen: no fever, chills  Skin: no rash, itching ENT: no ear pain, ear drainage, nasal congestion, rhinorrhea, sinus pressure, sore throat Eyes: no blurry vision, double vision Resp: no cough, wheeze,SOB CV: no CP, palpitations, LE edema,  GI: no heartburn, n/v/d/c, abd pain GU: no dysuria, urgency, frequency, hematuria  MSK: no joint pain, myalgias, back pain Neuro: no dizziness, headache, weakness, vertigo Psych: no depression, anxiety, insomnia   Allergies  Allergen Reactions  . Darvocet [Propoxyphene N-Acetaminophen] Nausea Only  . Lisinopril Cough  . Losartan Cough    BP 124/76   Pulse (!) 109   Temp (!) 97.2 F (36.2 C) (Temporal)   Ht 5' 3.25" (1.607 m)   Wt 187 lb 9.6 oz (85.1 kg)   SpO2 96%   BMI 32.97 kg/m   Physical Exam Constitutional:      General: She is not in  acute distress.    Appearance: She is well-developed.  HENT:     Head: Normocephalic and atraumatic.     Right Ear: Tympanic membrane and ear canal normal.     Left Ear: Tympanic membrane and ear canal normal.     Nose: Nose normal.  Eyes:     Conjunctiva/sclera: Conjunctivae normal.     Pupils: Pupils are equal, round, and  reactive to light.  Neck:     Thyroid: No thyromegaly.  Cardiovascular:     Rate and Rhythm: Normal rate and regular rhythm.     Heart sounds: Normal heart sounds. No murmur heard.   Pulmonary:     Effort: Pulmonary effort is normal. No respiratory distress.     Breath sounds: Normal breath sounds. No wheezing or rhonchi.  Abdominal:     General: Bowel sounds are normal. There is no distension.     Palpations: Abdomen is soft. There is no mass.     Tenderness: There is no abdominal tenderness.  Musculoskeletal:     Cervical back: Neck supple.  Lymphadenopathy:     Cervical: No cervical adenopathy.  Skin:    General: Skin is warm and dry.  Neurological:     Mental Status: She is alert and oriented to person, place, and time.     Motor: No abnormal muscle tone.     Coordination: Coordination normal.  Psychiatric:        Behavior: Behavior normal.      A/P:  1. Annual physical exam - discussed importance of regular CV exercise, healthy diet, adequate sleep - overdue for colonoscopy - pt will call [facility] to schedule with Dr. Henrene Pastor - due for mammo - does it annually with GYN Dr. Radene Knee at [facility] - immunizations UTD - VITAMIN D 25 Hydroxy (Vit-D Deficiency, Fractures) - Lipid panel - Basic metabolic panel - AST - ALT - next CPE in 1 year  2. Type 2 diabetes mellitus without complication, without long-term current use of insulin (HCC) - last A1C = 7.8 - Hemoglobin A1c - Microalbumin / creatinine urine ratio Refill: - dapagliflozin propanediol (FARXIGA) 10 MG TABS tablet; Take 1 tablet (10 mg total) by mouth daily.  Dispense: 90 tablet;  Refill: 3 - metFORMIN (GLUCOPHAGE XR) 500 MG 24 hr tablet; Take 3 tablets (1,500 mg total) by mouth daily with breakfast.  Dispense: 270 tablet; Refill: 1 - glucose monitoring kit (FREESTYLE) monitoring kit; 1 each by Does not apply route as needed for other.  Dispense: 1 each; Refill: 0 - discussed importance of dietary improvements - limiting fast food/take-out and following low carb/low sugar diet - f/u in 3 mo  3. Hyperlipidemia LDL goal <100 - Lipid panel - AST - ALT Refill: - atorvastatin (LIPITOR) 40 MG tablet; Take 1 tablet (40 mg total) by mouth daily.  Dispense: 90 tablet; Refill: 3  4. Vitamin D deficiency - VITAMIN D 25 Hydroxy (Vit-D Deficiency, Fractures)  5. Essential hypertension - controlled, at goal - Basic metabolic panel Refill: - amLODipine-olmesartan (AZOR) 10-40 MG tablet; Take 1 tablet by mouth daily.  Dispense: 90 tablet; Refill: 3  6. Need for shingles vaccine - Varicella-zoster vaccine IM (Shingrix)  7. Need for influenza vaccination - will get at work ([place])  8. Neuropathic pain - stable Refill: - imipramine (TOFRANIL) 50 MG tablet; Take 1 tablet (50 mg total) by mouth at bedtime.  Dispense: 90 tablet; Refill: 3  9. Gastroesophageal reflux disease - stable Refill: - esomeprazole (NEXIUM) 40 MG capsule; Take 1 capsule (40 mg total) by mouth at bedtime.  Dispense: 90 capsule; Refill: 3    This visit occurred during the SARS-CoV-2 public health emergency.  Safety protocols were in place, including screening questions prior to the visit, additional usage of staff PPE, and extensive cleaning of exam room while observing appropriate contact time as indicated for disinfecting solutions.

## 2020-06-21 NOTE — Addendum Note (Signed)
Addended by: Ronnald Nian on: 06/21/2020 10:53 AM   Modules accepted: Orders

## 2020-06-25 ENCOUNTER — Other Ambulatory Visit: Payer: Self-pay

## 2020-06-25 DIAGNOSIS — E119 Type 2 diabetes mellitus without complications: Secondary | ICD-10-CM

## 2020-06-25 MED ORDER — FREESTYLE SYSTEM KIT: 1.0000 | PACK | 0 refills | Status: AC | PRN

## 2020-06-27 ENCOUNTER — Other Ambulatory Visit: Payer: Self-pay | Admitting: Family Medicine

## 2020-06-27 ENCOUNTER — Encounter: Payer: Self-pay | Admitting: Family Medicine

## 2020-06-27 DIAGNOSIS — E119 Type 2 diabetes mellitus without complications: Secondary | ICD-10-CM

## 2020-06-27 MED ORDER — SITAGLIPTIN PHOSPHATE 50 MG PO TABS: 50.0000 mg | ORAL_TABLET | Freq: Every day | ORAL | 3 refills | Status: DC

## 2020-06-27 MED ORDER — METFORMIN HCL ER (MOD) 1000 MG PO TB24: 2000.0000 mg | ORAL_TABLET | Freq: Every day | ORAL | 3 refills | Status: DC

## 2020-06-27 MED FILL — JANUVIA 50 MG TABLET: 50 | 30 days supply | Qty: 30 | Fill #0

## 2020-06-29 ENCOUNTER — Telehealth: Payer: Self-pay | Admitting: Family Medicine

## 2020-06-29 ENCOUNTER — Other Ambulatory Visit: Payer: Self-pay | Admitting: Family Medicine

## 2020-06-29 ENCOUNTER — Other Ambulatory Visit: Payer: Self-pay

## 2020-06-29 DIAGNOSIS — E559 Vitamin D deficiency, unspecified: Secondary | ICD-10-CM

## 2020-06-29 MED ORDER — VITAMIN D (ERGOCALCIFEROL) 1.25 MG (50000 UNIT) PO CAPS: 50000.0000 [IU] | ORAL_CAPSULE | ORAL | 4 refills | Status: DC

## 2020-06-29 MED FILL — VIT D2 1.25 MG (50,000 UNIT: 1.25 MG | 28 days supply | Qty: 4 | Fill #0

## 2020-06-29 NOTE — Telephone Encounter (Signed)
I called pt, no answer.  Rx sent in today.

## 2020-06-29 NOTE — Telephone Encounter (Signed)
Richardson Landry called and stated that patient stated that Dr. Loletha Grayer was going to send her in a prescription for Vitamin D, please advise. CB is [phone]

## 2020-06-29 NOTE — Telephone Encounter (Signed)
Per Dr. Loletha Grayer, "Rx for Vit D 50,000IU 1 capsule per week.

## 2020-07-27 MED FILL — JANUVIA 50 MG TABLET: 50 | 30 days supply | Qty: 30 | Fill #1

## 2020-07-27 MED FILL — VIT D2 1.25 MG (50,000 UNIT: 1.25 MG | 84 days supply | Qty: 12 | Fill #1

## 2020-08-22 ENCOUNTER — Ambulatory Visit (INDEPENDENT_AMBULATORY_CARE_PROVIDER_SITE_OTHER): Payer: 59

## 2020-08-22 ENCOUNTER — Other Ambulatory Visit: Payer: Self-pay

## 2020-08-22 ENCOUNTER — Ambulatory Visit: Payer: 59

## 2020-08-22 DIAGNOSIS — Z23 Encounter for immunization: Secondary | ICD-10-CM | POA: Diagnosis not present

## 2020-08-22 NOTE — Progress Notes (Signed)
Per orders of Dr. Bryan Lemma injection of Shingles injection given by Verline Lema L Norris Bodley in right deltoid. Patient tolerated injection well. No signs or symptoms of a reaction were noted prior to patient leaving the nurse visit.

## 2020-08-22 NOTE — Patient Instructions (Signed)
Health Maintenance Due  Topic Date Due   Fecal DNA (Cologuard)  Never done   FOOT EXAM  03/26/2019   MAMMOGRAM  04/10/2019   OPHTHALMOLOGY EXAM  03/13/2020   COVID-19 Vaccine (3 - Booster for Pfizer series) 03/20/2020   INFLUENZA VACCINE  04/08/2020    Depression screen PHQ 2/9 06/21/2020 06/21/2020 12/31/2018  Decreased Interest 1 1 0  Down, Depressed, Hopeless 0 2 0  PHQ - 2 Score 1 3 0  Altered sleeping 0 - -  Tired, decreased energy 1 - -  Change in appetite 3 - -  Feeling bad or failure about yourself  1 - -  Trouble concentrating 0 - -  Moving slowly or fidgety/restless 0 - -  Suicidal thoughts 0 - -  PHQ-9 Score 6 - -  Difficult doing work/chores Not difficult at all - -

## 2020-09-19 MED FILL — JANUVIA 50 MG TABLET: 50 | 30 days supply | Qty: 30 | Fill #2

## 2020-10-05 MED FILL — IMIPRAMINE HCL 50 MG TABLET: 50 | 90 days supply | Qty: 90 | Fill #1

## 2020-10-05 MED FILL — FARXIGA 10 MG TABLET: 10 | 90 days supply | Qty: 90 | Fill #1

## 2020-10-05 MED FILL — ATORVASTATIN 40 MG TABLET: 40 | 90 days supply | Qty: 90 | Fill #1

## 2020-10-05 MED FILL — ESOMEPRAZOLE MAG DR 40 MG C: 40 | 90 days supply | Qty: 90 | Fill #1

## 2020-10-05 MED FILL — AMLODIPINE-OLMESARTAN 10-40: 10-40 | 90 days supply | Qty: 90 | Fill #1

## 2020-10-05 MED FILL — METFORMIN HCL ER 500 MG TB2: 500 | 90 days supply | Qty: 270 | Fill #1

## 2020-10-07 ENCOUNTER — Encounter (HOSPITAL_BASED_OUTPATIENT_CLINIC_OR_DEPARTMENT_OTHER): Payer: Self-pay | Admitting: Emergency Medicine

## 2020-10-07 ENCOUNTER — Emergency Department (HOSPITAL_BASED_OUTPATIENT_CLINIC_OR_DEPARTMENT_OTHER)
Admission: EM | Admit: 2020-10-07 | Discharge: 2020-10-07 | Disposition: A | Discharge status: Home / Self Care | Payer: 59 | Attending: Emergency Medicine | Admitting: Emergency Medicine

## 2020-10-07 ENCOUNTER — Emergency Department (HOSPITAL_BASED_OUTPATIENT_CLINIC_OR_DEPARTMENT_OTHER): Payer: 59

## 2020-10-07 ENCOUNTER — Other Ambulatory Visit: Payer: Self-pay

## 2020-10-07 DIAGNOSIS — Z79899 Other long term (current) drug therapy: Secondary | ICD-10-CM | POA: Insufficient documentation

## 2020-10-07 DIAGNOSIS — M545 Low back pain, unspecified: Secondary | ICD-10-CM | POA: Diagnosis not present

## 2020-10-07 DIAGNOSIS — M5459 Other low back pain: Secondary | ICD-10-CM | POA: Diagnosis not present

## 2020-10-07 DIAGNOSIS — Z7984 Long term (current) use of oral hypoglycemic drugs: Secondary | ICD-10-CM | POA: Diagnosis not present

## 2020-10-07 DIAGNOSIS — I10 Essential (primary) hypertension: Secondary | ICD-10-CM | POA: Diagnosis not present

## 2020-10-07 DIAGNOSIS — E119 Type 2 diabetes mellitus without complications: Secondary | ICD-10-CM | POA: Diagnosis not present

## 2020-10-07 MED ORDER — ONDANSETRON HCL 4 MG/2ML IJ SOLN
4.0000 mg | Freq: Once | INTRAMUSCULAR | Status: AC
  Administered 2020-10-07: 4 mg via INTRAMUSCULAR

## 2020-10-07 MED ORDER — MORPHINE SULFATE (PF) 4 MG/ML IV SOLN
4.0000 mg | Freq: Once | INTRAVENOUS | Status: AC
Start: 2020-10-07 — End: 2020-10-07
  Administered 2020-10-07: 4 mg via INTRAVENOUS

## 2020-10-07 MED ORDER — MORPHINE SULFATE (PF) 4 MG/ML IV SOLN
4.0000 mg | Freq: Once | INTRAVENOUS | Status: DC
  Administered 2020-10-07: 4 mg via INTRAVENOUS
  Filled 2020-10-07: qty 1

## 2020-10-07 MED ORDER — CYCLOBENZAPRINE HCL 10 MG PO TABS: 10.0000 mg | ORAL_TABLET | Freq: Two times a day (BID) | ORAL | 0 refills | Status: DC | PRN

## 2020-10-07 MED ORDER — LIDOCAINE 5 % EX PTCH: 1.0000 | MEDICATED_PATCH | CUTANEOUS | 0 refills | Status: DC

## 2020-10-07 MED ORDER — CYCLOBENZAPRINE HCL 5 MG PO TABS
5.0000 mg | ORAL_TABLET | Freq: Once | ORAL | Status: AC
  Administered 2020-10-07: 5 mg via ORAL
  Filled 2020-10-07: qty 1

## 2020-10-07 MED ORDER — HYDROCODONE-ACETAMINOPHEN 5-325 MG PO TABS: 2.0000 | ORAL_TABLET | ORAL | 0 refills | Status: DC | PRN

## 2020-10-07 MED ORDER — ONDANSETRON HCL 4 MG/2ML IJ SOLN
4.0000 mg | Freq: Once | INTRAMUSCULAR | Status: DC
  Filled 2020-10-07: qty 2

## 2020-10-07 MED ORDER — ONDANSETRON 4 MG PO TBDP: 4.0000 mg | ORAL_TABLET | Freq: Three times a day (TID) | ORAL | 0 refills | Status: DC | PRN

## 2020-10-07 NOTE — ED Triage Notes (Addendum)
Pt arrives pov with driver, to triage in [facility], reports lower bilateral back pain that started yesterday when trying to get out of bed. Denies dysuria

## 2020-10-07 NOTE — Discharge Instructions (Signed)
Take medications as prescribed.  Do not drive or operate heavy machinery while taking these medications as they make you sleepy.    Follow up with Primary care provider or Orthopedics if symptoms do not improve in 1 week  Return for new or worsening symptoms

## 2020-10-07 NOTE — ED Notes (Signed)
Pt. Going to Radiology at this time

## 2020-10-07 NOTE — ED Provider Notes (Cosign Needed Addendum)
[facility] EMERGENCY DEPARTMENT Provider Note   CSN: [id number] Arrival date & time: 10/07/20  1225    History Chief Complaint  Patient presents with  . Back Pain    [name] is a 60 y.o. female with past medical history significant for diabetes, hiatal hernia, hypertension who presents for evaluation of back pain.  Began 2 days ago.  Awoke Saturday around 930, noted some lower back pain. Went to get up around 11:00 with worsening pain to lower back. She needed help from her husband to get out of bed. She does not note any recent falls. Pain does not radiate into her abdomen or her legs.  Pain worse with movement.  If she lays still her pain improves.  Denies fever, chills, nausea, vomiting, chest pain, shortness of breath abdominal pain, diarrhea, dysuria, paresthesias, weakness, IV drug use, bowel or bladder incontinence, saddle paresthesia.  Has taken Advil without relief of her pain. Denies additional aggravating or relieving factors.  History obtained from patient and past medical records.  No interpreter used.  HPI     Past Medical History:  Diagnosis Date  . Diabetes mellitus without complication ([place])   . Endometriosis   . Fibroids    uterine  . GERD (gastroesophageal reflux disease)   . 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 History of hiatal hernia   . History of kidney stones 2014, 2017  . Hyperlipidemia   . Hypertension   . 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 Obesity (BMI 30-39.9)   . Peptic stricture of esophagus   . Uterine fibroid     Patient Active Problem List   Diagnosis Date Noted  . Type 2 diabetes mellitus without complication, without long-term current use of insulin ([place]) 07/10/2017  . Left flank pain, chronic 07/10/2017  . Hiatal hernia with GERD without esophagitis 03/10/2017  . Neuropathic pain 03/10/2017  . Vitamin D  deficiency 03/10/2017  . Hyperlipidemia LDL goal <100 03/25/2013  . Hypertension 03/25/2013  . Hyperglycemia 11/01/2012    Past Surgical History:  Procedure Laterality Date  . ABDOMINAL HYSTERECTOMY  2008  . BREAST CYST EXCISION  2007  . EXPLORATORY LAPAROTOMY  ~2005  . MASS EXCISION Left 07/11/2016   Procedure: EXCISION MASS LEFT UPPER ARM;  Surgeon: Armandina Gemma, MD;  Location: [facility];  Service: General;  Laterality: Left;     OB History   No obstetric history on file.     Family History  Problem Relation Age of Onset  . Diabetes Mother   . Hypertension Father   . Heart attack Father   . Heart disease Sister   . Diabetes Sister   . Heart disease Brother   . Diabetes Brother   . Hyperlipidemia Brother   . Stroke Brother   . Colon cancer Neg Hx     Social History   Tobacco Use  . Smoking status: Never Smoker  . Smokeless tobacco: Never Used  Vaping Use  . Vaping Use: Never used  Substance Use Topics  . Alcohol use: Yes    Alcohol/week: 1.0 standard drink    Types: 1 Glasses of wine per week    Comment: 1-2 per week  . Drug use: No    Home Medications Prior to Admission medications   Medication Sig Start Date End Date Taking? Authorizing Provider  cyclobenzaprine (FLEXERIL) 10 MG tablet  Take 1 tablet (10 mg total) by mouth 2 (two) times daily as needed for muscle spasms. 10/07/20  Yes Rikayla Demmon A, PA-C  HYDROcodone-acetaminophen (NORCO/VICODIN) 5-325 MG tablet Take 2 tablets by mouth every 4 (four) hours as needed. 10/07/20  Yes Marilouise Densmore A, PA-C  lidocaine (LIDODERM) 5 % Place 1 patch onto the skin daily. Remove & Discard patch within 12 hours or as directed by MD 10/07/20  Yes Adaeze Better A, PA-C  ondansetron (ZOFRAN ODT) 4 MG disintegrating tablet Take 1 tablet (4 mg total) by mouth every 8 (eight) hours as needed for nausea or vomiting. 10/07/20  Yes Ellinor Test A, PA-C  amLODipine-olmesartan (AZOR) 10-40 MG tablet Take 1  tablet by mouth daily. 06/21/20   Cirigliano, Garvin Fila, DO  aspirin 81 MG tablet Take 81 mg by mouth daily.      [provider]  atorvastatin (LIPITOR) 40 MG tablet Take 1 tablet (40 mg total) by mouth daily. 06/21/20   Cirigliano, Garvin Fila, DO  Calcium-Vitamin D (CALTRATE 600 PLUS-VIT D PO) Take 1 tablet by mouth 2 (two) times daily.      [provider]  dapagliflozin propanediol (FARXIGA) 10 MG TABS tablet Take 1 tablet (10 mg total) by mouth daily. 06/21/20   Cirigliano, Garvin Fila, DO  esomeprazole (NEXIUM) 40 MG capsule Take 1 capsule (40 mg total) by mouth at bedtime. 06/21/20   Cirigliano, Mary K, DO  glucose blood test strip Use to check blood sugar daily. 03/30/13   Shawnee Knapp, MD  glucose monitoring kit (FREESTYLE) monitoring kit 1 each by Does not apply route as needed for other. 06/25/20   Cirigliano, Mary K, DO  imipramine (TOFRANIL) 50 MG tablet Take 1 tablet (50 mg total) by mouth at bedtime. 06/21/20   Cirigliano, Garvin Fila, DO  Lancets MISC USE TO CHECK BLOOD SUGAR DAILY. 03/30/13   Shawnee Knapp, MD  metFORMIN (GLUMETZA) 1000 MG (MOD) 24 hr tablet Take 2 tablets (2,000 mg total) by mouth daily with breakfast. 06/27/20   Cirigliano, Mary K, DO  sitaGLIPtin (JANUVIA) 50 MG tablet Take 1 tablet (50 mg total) by mouth daily. 06/27/20   Cirigliano, Garvin Fila, DO    Allergies    Darvocet [propoxyphene n-acetaminophen], Lisinopril, and Losartan  Review of Systems   Review of Systems  Constitutional: Negative.   HENT: Negative.   Respiratory: Negative.   Cardiovascular: Negative.   Gastrointestinal: Negative.   Genitourinary: Negative.   Musculoskeletal: Positive for back pain.  Skin: Negative.   Neurological: Negative.   All other systems reviewed and are negative.   Physical Exam Updated Vital Signs BP (!) 141/78 (BP Location: Right Arm)   Pulse 93   Temp 97.7 F (36.5 C)   Resp 18   SpO2 95%   Physical Exam Physical Exam  Constitutional: Pt appears  well-developed and well-nourished. No distress.  HENT:  Head: Normocephalic and atraumatic.  Mouth/Throat: Oropharynx is clear and moist. No oropharyngeal exudate.  Eyes: Conjunctivae are normal.  Neck: Normal range of motion. Neck supple.  Full ROM without pain  Cardiovascular: Normal rate, regular rhythm and intact distal pulses.   Pulmonary/Chest: Effort normal and breath sounds normal. No respiratory distress. Pt has no wheezes.  Abdominal: Soft. Pt exhibits no distension. There is no tenderness, rebound or guarding. No abd bruit or pulsatile mass Musculoskeletal:  Full range of motion of the T-spine and L-spine with flexion, hyperextension, and lateral flexion. No midline tenderness or stepoffs. No tenderness to palpation  of the spinous processes of the T-spine or L-spine. Mild tenderness to palpation of the paraspinous muscles of the L-spine bilaterally. Negative straight leg raise. Lymphadenopathy:    Pt has no cervical adenopathy.  Neurological: Pt is alert. Pt has normal reflexes.  Reflex Scores:      Bicep reflexes are 2+ on the right side and 2+ on the left side.      Brachioradialis reflexes are 2+ on the right side and 2+ on the left side.      Patellar reflexes are 2+ on the right side and 2+ on the left side.      Achilles reflexes are 2+ on the right side and 2+ on the left side. Speech is clear and goal oriented, follows commands Normal 5/5 strength in upper and lower extremities bilaterally including dorsiflexion and plantar flexion, strong and equal grip strength Sensation normal to light and sharp touch Moves extremities without ataxia, coordination intact Normal gait Normal balance No Clonus Skin: Skin is warm and dry. No rash noted or lesions noted. Pt is not diaphoretic. No erythema, ecchymosis,edema or warmth.  Psychiatric: Pt has a normal mood and affect. Behavior is normal.  Nursing note and vitals reviewed. ED Results / Procedures / Treatments   Labs (all  labs ordered are listed, but only abnormal results are displayed) Labs Reviewed  URINALYSIS, ROUTINE W REFLEX MICROSCOPIC    EKG None  Radiology DG Lumbar Spine Complete  Result Date: 10/07/2020 CLINICAL DATA:  Bilateral lumbosacral back pain, onset yesterday when getting out of bed. EXAM: LUMBAR SPINE - COMPLETE 4+ VIEW COMPARISON:  None. FINDINGS: Trace retrolisthesis of L4 on L5. Alignment otherwise normal. Disc space narrowing and endplate spurring at U1-L2 and to a lesser extent L2-L3 and L3-L4. The vertebral body heights are preserved. Mild facet hypertrophy at L5-S1. Enlarged left transverse process of L5 and pseudoarticulation with the sacrum. No fracture, evidence of focal bone lesion or bony destruction. The sacroiliac joints are congruent. IMPRESSION: 1. Mild degenerative disc disease most prominent at L4-L5. 2. L5-S1 facet hypertrophy. 3. Enlarged left transverse process of L5 and pseudoarticulation with the sacrum. Electronically Signed   By: Keith Rake M.D.   On: 10/07/2020 15:45    Procedures Procedures   Medications Ordered in ED Medications  cyclobenzaprine (FLEXERIL) tablet 5 mg (5 mg Oral Given 10/07/20 1503)  morphine 4 MG/ML injection 4 mg (4 mg Intravenous Not Given 10/07/20 1512)  ondansetron (ZOFRAN) injection 4 mg (4 mg Intramuscular Given 10/07/20 1512)   ED Course  I have reviewed the triage vital signs and the nursing notes.  Pertinent labs & imaging results that were available during my care of the patient were reviewed by me and considered in my medical decision making (see chart for details).  60 year old presents for evaluation of nontraumatic back pain after getting out of bed 2 days ago.  Pain does not radiate into abdomen, lower legs.  She is afebrile, nonseptic, non-ill-appearing.  Diffuse tenderness to lower back however negative straight leg raise bilaterally.  Heart and lungs clear but abdomen soft, nontender.  Neurovascularly intact.  Normal  musculoskeletal exam.  X-ray shows mild degenerative disease most prominent L4, L5, facet hypertrophy at L5/S1, enlarged left transverse process of L5.  Patient came in pain management here in ED.  Pain significantly improved.  She is requesting discharge home.  Discussed symptomatic management.  At this time of low suspicion for acute neurosurgical emergency such as cauda equina, discitis, osteomyelitis, transverse myelitis, psoas abscess, acute  vascular etiology, intra-abdominal process.  Discussed well with PCP return for any worsening symptoms  The patient has been appropriately medically screened and/or stabilized in the ED. I have low suspicion for any other emergent medical condition which would require further screening, evaluation or treatment in the ED or require inpatient management.  Patient is hemodynamically stable and in no acute distress.  Patient able to ambulate in department prior to ED.  Evaluation does not show acute pathology that would require ongoing or additional emergent interventions while in the emergency department or further inpatient treatment.  I have discussed the diagnosis with the patient and answered all questions.  Pain is been managed while in the emergency department and patient has no further complaints prior to discharge.  Patient is comfortable with plan discussed in room and is stable for discharge at this time.  I have discussed strict return precautions for returning to the emergency department.  Patient was encouraged to follow-up with PCP/specialist refer to at discharge.  ADDEND: HPI clarification, per patient request. Dictation misswording.    MDM Rules/Calculators/A&P                          Final Clinical Impression(s) / ED Diagnoses Final diagnoses:  Acute bilateral low back pain without sciatica    Rx / DC Orders ED Discharge Orders         Ordered    cyclobenzaprine (FLEXERIL) 10 MG tablet  2 times daily PRN        10/07/20 1625     lidocaine (LIDODERM) 5 %  Every 24 hours        10/07/20 1625    HYDROcodone-acetaminophen (NORCO/VICODIN) 5-325 MG tablet  Every 4 hours PRN        10/07/20 1625    ondansetron (ZOFRAN ODT) 4 MG disintegrating tablet  Every 8 hours PRN        10/07/20 1625           Indica Marcott A, PA-C 10/07/20 Rebecca, Nathan, MD 10/10/20 2327    Iyanna Drummer A, PA-C 10/22/20 1830    Davonna Belling, MD 10/23/20 [phone]

## 2020-10-11 ENCOUNTER — Other Ambulatory Visit: Payer: Self-pay

## 2020-10-12 ENCOUNTER — Encounter: Payer: Self-pay | Admitting: Family Medicine

## 2020-10-12 ENCOUNTER — Ambulatory Visit: Payer: 59 | Admitting: Family Medicine

## 2020-10-12 VITALS — BP 150/82 | HR 105 | Temp 98.2°F | Ht 63.0 in | Wt 188.0 lb

## 2020-10-12 DIAGNOSIS — M545 Low back pain, unspecified: Secondary | ICD-10-CM | POA: Insufficient documentation

## 2020-10-12 NOTE — Patient Instructions (Signed)

## 2020-10-12 NOTE — Progress Notes (Signed)
[facility] [address] [place] [address] Dept: [phone] Dept Fax: [phone]  Acute Office Visit  Subjective:    Patient ID: [name], female    DOB: 08-12-1961, 60 y.o..   MRN: [id number]  Chief Complaint  Patient presents with  . Back Pain    Lower back pains x 6 days. Follow up from ED still having some pains but there is improvement.     History of Present Illness:  Patient is in today for reassessment from a recent ED visit for acute low back pain. She had sudden onset of the pain after awakening about 6 days ago. Pain has been in a band across the lower back. She denies any pain, numbness, tingling or weakness in the lower extremities. Her lumbar spine series showed:  1. Mild degenerative disc disease most prominent at L4-L5. 2. L5-S1 facet hypertrophy. 3. Enlarged left transverse process of L5 and pseudoarticulation with the sacrum.  She was treated with morphine, Flexeril, and ondansetron in the ED and sent home with Vicodin, Flexeril, Zofran, and lidocaine patches. She is using 1/2 tab of Vicodin when needed (used 4 tabs in past 5 days). She notes her pain is improving. She has rested a bit this week but is getting up and around more now.  Past Medical History: Patient Active Problem List   Diagnosis Date Noted  . Acute bilateral low back pain without sciatica 10/12/2020  . Type 2 diabetes mellitus without complication, without long-term current use of insulin ([place]) 07/10/2017  . Left flank pain, chronic 07/10/2017  . Hiatal hernia with GERD without esophagitis 03/10/2017  . Neuropathic pain 03/10/2017  . Vitamin D deficiency 03/10/2017  . Hyperlipidemia LDL goal <100 03/25/2013  . Hypertension 03/25/2013  . Hyperglycemia 11/01/2012   Past Surgical History:  Procedure Laterality Date  . ABDOMINAL HYSTERECTOMY  2008  . BREAST CYST EXCISION  2007  . EXPLORATORY LAPAROTOMY  ~2005  . MASS  EXCISION Left 07/11/2016   Procedure: EXCISION MASS LEFT UPPER ARM;  Surgeon: Armandina Gemma, MD;  Location: [facility];  Service: General;  Laterality: Left;   Outpatient Medications Prior to Visit  Medication Sig Dispense Refill  . amLODipine-olmesartan (AZOR) 10-40 MG tablet Take 1 tablet by mouth daily. 90 tablet 3  . aspirin 81 MG tablet Take 81 mg by mouth daily.    Marland Kitchen atorvastatin (LIPITOR) 40 MG tablet Take 1 tablet (40 mg total) by mouth daily. 90 tablet 3  . Calcium-Vitamin D (CALTRATE 600 PLUS-VIT D PO) Take 1 tablet by mouth 2 (two) times daily.    . cyclobenzaprine (FLEXERIL) 10 MG tablet Take 1 tablet (10 mg total) by mouth 2 (two) times daily as needed for muscle spasms. 20 tablet 0  . dapagliflozin propanediol (FARXIGA) 10 MG TABS tablet Take 1 tablet (10 mg total) by mouth daily. 90 tablet 3  . esomeprazole (NEXIUM) 40 MG capsule Take 1 capsule (40 mg total) by mouth at bedtime. 90 capsule 3  . glucose blood test strip Use to check blood sugar daily. 100 each 3  . glucose monitoring kit (FREESTYLE) monitoring kit 1 each by Does not apply route as needed for other. 1 each 0  . HYDROcodone-acetaminophen (NORCO/VICODIN) 5-325 MG tablet Take 2 tablets by mouth every 4 (four) hours as needed. 10 tablet 0  . imipramine (TOFRANIL) 50 MG tablet Take 1 tablet (50 mg total) by mouth at bedtime. 90 tablet 3  . Lancets MISC USE  TO CHECK BLOOD SUGAR DAILY. 100 each 3  . lidocaine (LIDODERM) 5 % Place 1 patch onto the skin daily. Remove & Discard patch within 12 hours or as directed by MD 30 patch 0  . metFORMIN (GLUMETZA) 1000 MG (MOD) 24 hr tablet Take 2 tablets (2,000 mg total) by mouth daily with breakfast. 180 tablet 3  . sitaGLIPtin (JANUVIA) 50 MG tablet Take 1 tablet (50 mg total) by mouth daily. 90 tablet 3  . ondansetron (ZOFRAN ODT) 4 MG disintegrating tablet Take 1 tablet (4 mg total) by mouth every 8 (eight) hours as needed for nausea or vomiting. (Patient not taking:  Reported on 10/12/2020) 20 tablet 0   No facility-administered medications prior to visit.   Allergies  Allergen Reactions  . Darvocet [Propoxyphene N-Acetaminophen] Nausea Only  . Lisinopril Cough  . Losartan Cough     Objective:   Today's Vitals   10/12/20 0809  BP: (!) 150/82  Pulse: (!) 105  Temp: 98.2 F (36.8 C)  TempSrc: Temporal  SpO2: 98%  Weight: 188 lb (85.3 kg)  Height: 5' 3"  (1.6 m)   Body mass index is 33.3 kg/m.   General: Well developed, well nourished. No acute distress. Psych: Alert and oriented. Normal mood and affect.  Health Maintenance Due  Topic Date Due  . Fecal DNA (Cologuard)  Never done  . FOOT EXAM  03/26/2019  . MAMMOGRAM  04/10/2019  . OPHTHALMOLOGY EXAM  03/13/2020  . COVID-19 Vaccine (3 - Booster for Pfizer series) 03/20/2020     Assessment & Plan:   1. Acute bilateral low back pain without sciatica Ms. Streat appears to have musculoskeletal low back pain. There is no sign of an acute nerve impingement. The lumbar x-rays indicate some mild degenerative issues with the lower lumbar vertebrae. We reviewed prognosis for muscular back pain. I provided her with home exercises. She can continue current meds. She may return to work on 2/7, as long as symptoms are improved. If not improved in 2 weeks, consider PT referral.  Recommended she follow-up soon for DM with her PCP.  Haydee Salter, MD

## 2020-10-17 ENCOUNTER — Telehealth: Payer: Self-pay

## 2020-10-17 NOTE — Telephone Encounter (Signed)
Pt calling to ask for a refill of the Flexeril, pt forgot to ask Dr. Gena Fray for a refill this past Friday.  Pt is asking PCP if she can refill this for her.  Please advise.  CB#[phone]

## 2020-10-26 ENCOUNTER — Encounter: Payer: Self-pay | Admitting: Family Medicine

## 2020-10-26 ENCOUNTER — Other Ambulatory Visit: Payer: Self-pay | Admitting: Family Medicine

## 2020-10-26 ENCOUNTER — Other Ambulatory Visit: Payer: Self-pay

## 2020-10-26 ENCOUNTER — Ambulatory Visit: Payer: 59 | Admitting: Family Medicine

## 2020-10-26 VITALS — BP 142/80 | HR 109 | Temp 98.2°F | Ht 63.0 in | Wt 188.4 lb

## 2020-10-26 DIAGNOSIS — M545 Low back pain, unspecified: Secondary | ICD-10-CM

## 2020-10-26 DIAGNOSIS — M62838 Other muscle spasm: Secondary | ICD-10-CM

## 2020-10-26 MED ORDER — HYDROCODONE-ACETAMINOPHEN 5-325 MG PO TABS: 2.0000 | ORAL_TABLET | ORAL | 0 refills | Status: DC | PRN

## 2020-10-26 MED ORDER — CYCLOBENZAPRINE HCL 10 MG PO TABS: 10.0000 mg | ORAL_TABLET | Freq: Two times a day (BID) | ORAL | 0 refills | Status: DC | PRN

## 2020-10-26 MED FILL — HYDROCODON-APAP 5-325: 5-325 | 2 days supply | Qty: 10 | Fill #0

## 2020-10-26 MED FILL — CYCLOBENZAPRINE HCL 10 MG T: 10 | 10 days supply | Qty: 20 | Fill #0

## 2020-10-26 NOTE — Progress Notes (Signed)
[facility] [address] [place] [address] Dept: [phone] Dept Fax: [phone]  Acute Office Visit  Subjective:    Patient ID: [name], female    DOB: 09-17-1960, 60 y.o..   MRN: [id number]  Chief Complaint  Patient presents with  . Follow-up    2 week follow up on back pains. No concerns there is some improvement.     History of Present Illness:  Patient is in today for reassessment of her low back muscle strain. I had seen her two weeks ago as follow-up to an ED visit. She had Vicodin and Flexeril that she received fromt he ED. I added some home exercises and she took a week off work. Ms. Kennington notes she has good and bad days with her back. The pain remains in a band across the lower back, however, she also is noting some left upper back discomfort. She is not doing as much lifting at work. She denies any new areas of numbness or weakness in her legs.  Past Medical History: Patient Active Problem List   Diagnosis Date Noted  . Acute bilateral low back pain without sciatica 10/12/2020  . Type 2 diabetes mellitus without complication, without long-term current use of insulin ([place]) 07/10/2017  . Left flank pain, chronic 07/10/2017  . Hiatal hernia with GERD without esophagitis 03/10/2017  . Neuropathic pain 03/10/2017  . Vitamin D deficiency 03/10/2017  . Hyperlipidemia LDL goal <100 03/25/2013  . Hypertension 03/25/2013  . Hyperglycemia 11/01/2012    Past Surgical History:  Procedure Laterality Date  . ABDOMINAL HYSTERECTOMY  2008  . BREAST CYST EXCISION  2007  . EXPLORATORY LAPAROTOMY  ~2005  . MASS EXCISION Left 07/11/2016   Procedure: EXCISION MASS LEFT UPPER ARM;  Surgeon: Armandina Gemma, MD;  Location: [facility];  Service: General;  Laterality: Left;   Outpatient Medications Prior to Visit  Medication Sig Dispense Refill  . amLODipine-olmesartan (AZOR) 10-40 MG tablet Take 1 tablet by  mouth daily. 90 tablet 3  . aspirin 81 MG tablet Take 81 mg by mouth daily.    Marland Kitchen atorvastatin (LIPITOR) 40 MG tablet Take 1 tablet (40 mg total) by mouth daily. 90 tablet 3  . Calcium-Vitamin D (CALTRATE 600 PLUS-VIT D PO) Take 1 tablet by mouth 2 (two) times daily.    . dapagliflozin propanediol (FARXIGA) 10 MG TABS tablet Take 1 tablet (10 mg total) by mouth daily. 90 tablet 3  . esomeprazole (NEXIUM) 40 MG capsule Take 1 capsule (40 mg total) by mouth at bedtime. 90 capsule 3  . imipramine (TOFRANIL) 50 MG tablet Take 1 tablet (50 mg total) by mouth at bedtime. 90 tablet 3  . lidocaine (LIDODERM) 5 % Place 1 patch onto the skin daily. Remove & Discard patch within 12 hours or as directed by MD 30 patch 0  . metFORMIN (GLUMETZA) 1000 MG (MOD) 24 hr tablet Take 2 tablets (2,000 mg total) by mouth daily with breakfast. 180 tablet 3  . sitaGLIPtin (JANUVIA) 50 MG tablet Take 1 tablet (50 mg total) by mouth daily. 90 tablet 3  . cyclobenzaprine (FLEXERIL) 10 MG tablet Take 1 tablet (10 mg total) by mouth 2 (two) times daily as needed for muscle spasms. 20 tablet 0  . glucose blood test strip Use to check blood sugar daily. (Patient not taking: Reported on 10/26/2020) 100 each 3  . glucose monitoring kit (FREESTYLE) monitoring kit 1 each by Does not apply route as  needed for other. (Patient not taking: Reported on 10/26/2020) 1 each 0  . Lancets MISC USE TO CHECK BLOOD SUGAR DAILY. (Patient not taking: Reported on 10/26/2020) 100 each 3  . ondansetron (ZOFRAN ODT) 4 MG disintegrating tablet Take 1 tablet (4 mg total) by mouth every 8 (eight) hours as needed for nausea or vomiting. (Patient not taking: No sig reported) 20 tablet 0  . HYDROcodone-acetaminophen (NORCO/VICODIN) 5-325 MG tablet Take 2 tablets by mouth every 4 (four) hours as needed. (Patient not taking: Reported on 10/26/2020) 10 tablet 0   No facility-administered medications prior to visit.   Allergies  Allergen Reactions  . Darvocet  [Propoxyphene N-Acetaminophen] Nausea Only  . Lisinopril Cough  . Losartan Cough     Objective:   Today's Vitals   10/26/20 0907  BP: (!) 142/80  Pulse: (!) 109  Temp: 98.2 F (36.8 C)  TempSrc: Temporal  SpO2: 94%  Weight: 188 lb 6.4 oz (85.5 kg)  Height: 5' 3"  (1.6 m)   Body mass index is 33.37 kg/m.   General: Well developed, well nourished. No acute distress. Extremities: Full ROM. Strength 5/5. Sensation normal.No joint swelling or tenderness. No edema noted. Back: Straight. Pain remains in a band over the lower lumbar area. Area of new pain in the upper back is over the left   trapezius muscles. Psych: Alert and oriented. Normal mood and affect.  Health Maintenance Due  Topic Date Due  . Fecal DNA (Cologuard)  Never done  . FOOT EXAM  03/26/2019  . MAMMOGRAM  04/10/2019  . OPHTHALMOLOGY EXAM  03/13/2020  . COVID-19 Vaccine (3 - Booster for Pfizer series) 03/20/2020     Assessment & Plan:   1. Acute bilateral low back pain without sciatica I will refer her to physical therapy. We will continue a short course of Vicodin and Flexeril. Follow-up after PT if symptoms not resolving.  - HYDROcodone-acetaminophen (NORCO/VICODIN) 5-325 MG tablet; Take 2 tablets by mouth every 4 (four) hours as needed.  Dispense: 10 tablet; Refill: 0 - cyclobenzaprine (FLEXERIL) 10 MG tablet; Take 1 tablet (10 mg total) by mouth 2 (two) times daily as needed for muscle spasms.  Dispense: 20 tablet; Refill: 0 - Ambulatory referral to Physical Therapy  2. Trapezius muscle spasm I recommend she see a massage therapist to work on releasing spasm.  Haydee Salter, MD

## 2020-11-07 ENCOUNTER — Other Ambulatory Visit: Payer: Self-pay

## 2020-11-07 ENCOUNTER — Ambulatory Visit: Payer: 59 | Attending: Family Medicine

## 2020-11-07 DIAGNOSIS — R262 Difficulty in walking, not elsewhere classified: Secondary | ICD-10-CM | POA: Diagnosis not present

## 2020-11-07 DIAGNOSIS — M2569 Stiffness of other specified joint, not elsewhere classified: Secondary | ICD-10-CM | POA: Insufficient documentation

## 2020-11-07 DIAGNOSIS — M62830 Muscle spasm of back: Secondary | ICD-10-CM | POA: Diagnosis not present

## 2020-11-07 DIAGNOSIS — M545 Low back pain, unspecified: Secondary | ICD-10-CM | POA: Insufficient documentation

## 2020-11-08 NOTE — Therapy (Signed)
[place], [place], [address] Phone: [phone]   Fax:  [phone]  Physical Therapy Evaluation  Patient Details  Name: [name] MRN: [id number] Date of Birth: 1961/05/23 Referring Provider (PT): Haydee Salter, MD   Encounter Date: 11/07/2020   PT End of Session - 11/07/20 1749    Visit Number 1    Number of Visits 13    Date for PT Re-Evaluation 01/05/21    Authorization Type [place] [employer]    Authorization Time Period re-assess FOTO on the 6th and 10th visits    Progress Note Due on Visit 10    PT Start Time 1617    PT Stop Time 1705    PT Time Calculation (min) 48 min    Activity Tolerance Patient tolerated treatment well    Behavior During Therapy [facility] for tasks assessed/performed           Past Medical History:  Diagnosis Date  . Diabetes mellitus without complication ([place])   . Endometriosis   . Fibroids    uterine  . GERD (gastroesophageal reflux disease)   . 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 History of hiatal hernia   . History of kidney stones 2014, 2017  . Hyperlipidemia   . Hypertension   . Neuromuscular disorder ([place]) ~2000   "exposed nerve" Lt flank  . Obesity (BMI 30-39.9)   . Peptic stricture of esophagus   . Uterine fibroid     Past Surgical History:  Procedure Laterality Date  . ABDOMINAL HYSTERECTOMY  2008  . BREAST CYST EXCISION  2007  . EXPLORATORY LAPAROTOMY  ~2005  . MASS EXCISION Left 07/11/2016   Procedure: EXCISION MASS LEFT UPPER ARM;  Surgeon: Armandina Gemma, MD;  Location: [place];  Service: General;  Laterality: Left;    There were no vitals filed for this visit.    Subjective Assessment - 11/07/20 1627    Subjective Pt reports she woke up on 10/06/20, ate breakfast and looked at the new snow, and decided to sleep a little longer. Upon trying to get up,  after sleeping about an hour, she reports experiencing significant low back pain with muscle spasms. She went to the ED the next day and has seen her MD 2x. She is taking pain medication and a muscle relaxor. Currently, her low back pain is improving, still having low back pain and tightness. Pt has returned to work where she completes a lot of lifting, but is trying not to lift heavy items.    Limitations Lifting;Walking    How long can you sit comfortably? Not an issue    How long can you stand comfortably? Not an issue    How long can you walk comfortably? 10 mins    Diagnostic tests 10/07/20: X-ray IMPRESSION:  1. Mild degenerative disc disease most prominent at L4-L5.  2. L5-S1 facet hypertrophy.  3. Enlarged left transverse process of L5 and pseudoarticulation  with the sacrum.    Currently in Pain? Yes    Pain Score 4    max pain level 5/10   Pain Location Back    Pain Orientation Posterior;Lower    Pain Type Acute pain    Pain Radiating Towards NA    Pain Onset More than a month ago    Pain Frequency Constant    Aggravating Factors  walking and lifting  Pain Relieving Factors meds, heat or cold packs    Effect of Pain on Daily Activities managing her pain at work              [facility] PT Assessment - 11/08/20 0001      Assessment   Medical Diagnosis Acute bilateral low back pain without sciatica    Referring Provider (PT) Haydee Salter, MD    Onset Date/Surgical Date 10/06/20    Prior Therapy No      Precautions   Precautions None      Restrictions   Weight Bearing Restrictions No      Balance Screen   Has the patient fallen in the past 6 months No      [place] residence    Living Arrangements Spouse/significant other    Type of [place] to enter    Entrance Stairs-Number of Steps 2    Entrance Stairs-Rails None    Home Layout One level      Prior Function   Level of Independence Independent     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 consistently during the day.      Cognition   Overall Cognitive Status Within Functional Limits for tasks assessed      Observation/Other Assessments   Focus on Therapeutic Outcomes (FOTO)  47%      Sensation   Light Touch Appears Intact      Posture/Postural Control   Posture/Postural Control Postural limitations    Postural Limitations Forward head      Deep Tendon Reflexes   DTR Assessment Site Patella;Achilles    Patella DTR 2+    Achilles DTR 2+      ROM / Strength   AROM / PROM / Strength AROM;Strength      AROM   Overall AROM Comments Increased pain with all motions except L rotation. Ext and L and R SB most significantly increased pt's pain.    AROM Assessment Site Lumbar    Lumbar Flexion 25% limitation    Lumbar Extension 75% limitation    Lumbar - Right Side Bend 50% limitation    Lumbar - Left Side Bend 50% limitation    Lumbar - Right Rotation Full    Lumbar - Left Rotation Full      Strength   Overall Strength Comments LE myotomal screen was negative      Flexibility   Soft Tissue Assessment /Muscle Length yes    Hamstrings L 50d; R 52d      Palpation   Palpation comment TTP to the Bilat paraspinals and around the bilat PSIS c increase muscle tension      Transfers   Transfers Sit to Stand;Stand to Sit    Sit to Stand 6: Modified independent (Device/Increase time)   use of hands, guarded movements     Ambulation/Gait   Ambulation/Gait Yes    Ambulation/Gait Assistance 6: Modified independent (Device/Increase time)   guarded movements   Gait Pattern Step-through pattern    Gait velocity decreased pace                      Objective measurements completed on examination: See above findings.               PT Education - 11/07/20 1745    Education Details Eval  findings, POC, HEP,  sleeping postures and support for comfort, back care for standing, lifting, bed mbility-log rolling    Person(s) Educated Patient    Methods Explanation;Demonstration;Tactile cues;Verbal cues;Handout    Comprehension Verbalized understanding;Returned demonstration;Verbal cues required;Tactile cues required;Need further instruction            PT Short Term Goals - 11/08/20 0807      PT SHORT TERM GOAL #1   Title Pt will be Ind in an initial HEP    Baseline started on eval    Status New    Target Date 12/06/20      PT SHORT TERM GOAL #2   Title Pt will voice understanding of measures to reduce and manage low back pain.    Status New    Target Date 12/06/20      PT SHORT TERM GOAL #3   Title Pt will demonstrate proper body mechanics for household and work related activities    Status New    Target Date 12/06/20             PT Long Term Goals - 11/08/20 0812      PT LONG TERM GOAL #1   Title Pt will complete transfer, gait, and lifting activities without guarding and improved quality of movement    Status New    Target Date 01/05/21      PT LONG TERM GOAL #2   Title Pt will report an improvement in low back pain to 2/10 or less with daily activites of home and work    Baseline 4-5/10    Status New    Target Date 01/05/21      PT LONG TERM GOAL #3   Title Pt will demonstrate improved ROM of her back by 25% with all motions    Baseline see flowsheets    Status New    Target Date 01/05/21      PT LONG TERM GOAL #4   Title Pt's FOTO score will improve to the predicted value of 63% functional ability    Baseline 47% functional ability    Status New    Target Date 01/05/21                  Plan - 11/07/20 1754    Clinical Impression Statement Pt presents to PT with low back pain of insidious onset and still acute in nature. DTR's and myotomal screen were negative. Low back mobility was decreased especially for ext and side bending. Bilat hamstrings are tight.  Pt was TTP of the low back paraspinals and around the bilat PSIS. Pt is continuing to experience low back muscle spasms with certain movements, and movement patterns were guarded for transfers and walking. PT was initiated for an HEP to promote lumbar mobility with emphasis on flexion and for core strengthening. Pt returned demonstration of these exs. Pt will benefit from skilled PT 2w6 for ROM, strengthening, back care, use of modalities, and manual techniques to decreased pain and optimize function.    Personal Factors and Comorbidities Comorbidity 3+;Comorbidity 2;Comorbidity 1    Comorbidities Low back degenerative changes for low back, DM, high BMI    Examination-Activity Limitations Locomotion Level;Bend;Carry;Lift    Examination-Participation Restrictions Occupation    Stability/Clinical Decision Making Stable/Uncomplicated    Clinical Decision Making Low    Rehab Potential Good    PT Frequency 2x / week    PT Duration 6 weeks    PT Treatment/Interventions ADLs/Self Care Home Management;Cryotherapy;Electrical Stimulation;Ultrasound;Traction;Moist Heat;Iontophoresis 4mg /ml  Dexamethasone;Therapeutic activities;Therapeutic exercise;Manual techniques;Patient/family education;Dry needling;Taping;Spinal Manipulations    PT Next Visit Plan Assess response to HEP. Progress ther ex as indicated. Utilize manual techniques and modalities as indicated.    PT Home Exercise Plan [id number]    Consulted and Agree with Plan of Care Patient           Patient will benefit from skilled therapeutic intervention in order to improve the following deficits and impairments:  Difficulty walking,Increased muscle spasms,Obesity,Decreased activity tolerance,Pain,Decreased mobility,Decreased range of motion,Decreased strength  Visit Diagnosis: Acute bilateral low back pain without sciatica  Muscle spasm of back  Decreased ROM of trunk and back  Difficulty in walking, not elsewhere classified     Problem  List Patient Active Problem List   Diagnosis Date Noted  . Acute bilateral low back pain without sciatica 10/12/2020  . Type 2 diabetes mellitus without complication, without long-term current use of insulin ([place]) 07/10/2017  . Left flank pain, chronic 07/10/2017  . Hiatal hernia with GERD without esophagitis 03/10/2017  . Neuropathic pain 03/10/2017  . Vitamin D deficiency 03/10/2017  . Hyperlipidemia LDL goal <100 03/25/2013  . Hypertension 03/25/2013  . Hyperglycemia 11/01/2012    Gar Ponto MS, PT 11/08/20 8:21 AM  [facility] [address] [place], [place], [address] Phone: [phone]   Fax:  [phone]  Name: [name] MRN: [id number] Date of Birth: 1960-11-03

## 2020-11-09 MED FILL — VIT D2 1.25 MG (50,000 UNIT: 1.25 MG | 27 days supply | Qty: 4 | Fill #2

## 2020-11-09 MED FILL — JANUVIA 50 MG TABLET: 50 | 30 days supply | Qty: 30 | Fill #3

## 2020-11-13 ENCOUNTER — Other Ambulatory Visit: Payer: Self-pay

## 2020-11-13 ENCOUNTER — Ambulatory Visit: Payer: 59

## 2020-11-13 DIAGNOSIS — R262 Difficulty in walking, not elsewhere classified: Secondary | ICD-10-CM | POA: Diagnosis not present

## 2020-11-13 DIAGNOSIS — M545 Low back pain, unspecified: Secondary | ICD-10-CM | POA: Diagnosis not present

## 2020-11-13 DIAGNOSIS — M2569 Stiffness of other specified joint, not elsewhere classified: Secondary | ICD-10-CM | POA: Diagnosis not present

## 2020-11-13 DIAGNOSIS — M62830 Muscle spasm of back: Secondary | ICD-10-CM | POA: Diagnosis not present

## 2020-11-14 NOTE — Therapy (Signed)
[place], [place], [address] Phone: [phone]   Fax:  [phone]  Physical Therapy Treatment  Patient Details  Name: [name] MRN: [id number] Date of Birth: [birth year] Referring Provider (PT): Haydee Salter, MD   Encounter Date: 11/13/2020   PT End of Session - 11/13/20 1531    Visit Number 2    Number of Visits 13    Date for PT Re-Evaluation 01/05/21    Authorization Type [place] [employer]    Authorization Time Period re-assess FOTO on the 6th and 10th visits    Progress Note Due on Visit 10    PT Start Time 1532   pt arrived late   PT Stop Time 1615    PT Time Calculation (min) 43 min    Activity Tolerance Patient tolerated treatment well    Behavior During Therapy [facility] for tasks assessed/performed           Past Medical History:  Diagnosis Date  . Diabetes mellitus without complication ([place])   . Endometriosis   . Fibroids    uterine  . GERD (gastroesophageal reflux disease)   . 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 History of hiatal hernia   . History of kidney stones 2014, 2017  . Hyperlipidemia   . Hypertension   . Neuromuscular disorder ([place]) ~2000   "exposed nerve" Lt flank  . Obesity (BMI 30-39.9)   . Peptic stricture of esophagus   . Uterine fibroid     Past Surgical History:  Procedure Laterality Date  . ABDOMINAL HYSTERECTOMY  2008  . BREAST CYST EXCISION  2007  . EXPLORATORY LAPAROTOMY  ~2005  . MASS EXCISION Left 07/11/2016   Procedure: EXCISION MASS LEFT UPPER ARM;  Surgeon: Armandina Gemma, MD;  Location: [place];  Service: General;  Laterality: Left;    There were no vitals filed for this visit.   Subjective Assessment - 11/13/20 1531    Subjective "I would say it's hurting a good half of the day. I'm not lifting the heavy stuff, but I am still lifting some. It's a lot of  wlaking and moving. Yesterday I sat the majority of the day, and it hurt." Work shifts range from 8-9 hours give or take some. "Can't get situated laying down at night. The pillows have helped under my knees when lying on my back."    Limitations Lifting;Walking    How long can you sit comfortably? Not an issue    How long can you stand comfortably? Not an issue    How long can you walk comfortably? 10 mins    Diagnostic tests 10/07/20: X-ray IMPRESSION:  1. Mild degenerative disc disease most prominent at L4-L5.  2. L5-S1 facet hypertrophy.  3. Enlarged left transverse process of L5 and pseudoarticulation  with the sacrum.    Currently in Pain? Yes    Pain Score 5     Pain Location Back    Pain Orientation Posterior;Lower    Pain Descriptors / Indicators Aching;Throbbing    Pain Type Acute pain    Pain Onset More than a month ago              [facility] PT Assessment - 11/14/20 0001      Assessment   Medical Diagnosis Acute bilateral low back pain without sciatica    Referring Provider (PT) Haydee Salter,  MD    Onset Date/Surgical Date 10/06/20                         [facility] Adult PT Treatment/Exercise - 11/14/20 0001      Exercises   Exercises Lumbar      Lumbar Exercises: Stretches   Double Knee to Chest Stretch Limitations 15x slow and controlled motion with green swiss ball      Lumbar Exercises: Seated   Other Seated Lumbar Exercises Seated hip hinge with dowel x 15. VC and TC by PT for neutral spine and decreased thoracic kyphosis    Other Seated Lumbar Exercises Seated R lateral flexion with green swiss ball 10 x 5 seconds      Lumbar Exercises: Supine   Pelvic Tilt 20 reps    Bridge with [employer] 10 reps    Bridge with [employer] Limitations cues for core activation and to avoid arching back/lumbar EXT      Lumbar Exercises: Sidelying   Other Sidelying Lumbar Exercises open book L rotation x 20      Manual Therapy   Manual Therapy Joint  mobilization;Soft tissue mobilization;Myofascial release    Joint Mobilization R UPA along lumbar TPs to facilitate L lumbar ROT (at L4 and L5 TPs primarily)    Soft tissue mobilization STM/MFR along L lumbar paraspinals                  PT Education - 11/14/20 1318    Education Details Updated and reviewed HEP, rationale for interventions    Person(s) Educated Patient    Methods Explanation;Demonstration;Tactile cues;Verbal cues;Handout    Comprehension Verbalized understanding;Returned demonstration            PT Short Term Goals - 11/08/20 0807      PT SHORT TERM GOAL #1   Title Pt will be Ind in an initial HEP    Baseline started on eval    Status New    Target Date 12/06/20      PT SHORT TERM GOAL #2   Title Pt will voice understanding of measures to reduce and manage low back pain.    Status New    Target Date 12/06/20      PT SHORT TERM GOAL #3   Title Pt will demonstrate proper body mechanics for household and work related activities    Status New    Target Date 12/06/20             PT Long Term Goals - 11/08/20 0812      PT LONG TERM GOAL #1   Title Pt will complete transfer, gait, and lifting activities without guarding and improved quality of movement    Status New    Target Date 01/05/21      PT LONG TERM GOAL #2   Title Pt will report an improvement in low back pain to 2/10 or less with daily activites of home and work    Baseline 4-5/10    Status New    Target Date 01/05/21      PT LONG TERM GOAL #3   Title Pt will demonstrate improved ROM of her back by 25% with all motions    Baseline see flowsheets    Status New    Target Date 01/05/21      PT LONG TERM GOAL #4   Title Pt's FOTO score will improve to the predicted value of 63% functional ability    Baseline  47% functional ability    Status New    Target Date 01/05/21                 Plan - 11/13/20 1532    Clinical Impression Statement Patient tolerated PT session well  with no adverse effects or complaints of significant increase in pain. Progressed interventions for lumbar mobility and core strength this session with some fatigue but no pain. She demonstrates increased thoracic flexion and kyphosis with attempted hip hinge in sitting using dowel but was able to improve form with verbal and tactile cues provided by PT. Pt expressed ease of symptoms with UPA along R lumbar TPs followed by open book with L rotation and core exercises. She explains that her pain was still present but had eased in intensity at end of session. She should continue to benefit from skilled PT to address deficits and allow for improved tolerance with functional tasks.    Personal Factors and Comorbidities Comorbidity 3+;Comorbidity 2;Comorbidity 1    Comorbidities Low back degenerative changes for low back, DM, high BMI    Examination-Activity Limitations Locomotion Level;Bend;Carry;Lift    Examination-Participation Restrictions Occupation    Stability/Clinical Decision Making Stable/Uncomplicated    Rehab Potential Good    PT Frequency 2x / week    PT Duration 6 weeks    PT Treatment/Interventions ADLs/Self Care Home Management;Cryotherapy;Electrical Stimulation;Ultrasound;Traction;Moist Heat;Iontophoresis 4mg /ml Dexamethasone;Therapeutic activities;Therapeutic exercise;Manual techniques;Patient/family education;Dry needling;Taping;Spinal Manipulations    PT Next Visit Plan Assess response to HEP and update PRN. Progress ther ex as indicated. Utilize manual techniques and modalities as indicated.    PT Home Exercise Plan [id number]    Consulted and Agree with Plan of Care Patient           Patient will benefit from skilled therapeutic intervention in order to improve the following deficits and impairments:  Difficulty walking,Increased muscle spasms,Obesity,Decreased activity tolerance,Pain,Decreased mobility,Decreased range of motion,Decreased strength  Visit Diagnosis: Acute bilateral  low back pain without sciatica  Muscle spasm of back  Decreased ROM of trunk and back  Difficulty in walking, not elsewhere classified     Problem List Patient Active Problem List   Diagnosis Date Noted  . Acute bilateral low back pain without sciatica 10/12/2020  . Type 2 diabetes mellitus without complication, without long-term current use of insulin ([place]) 07/10/2017  . Left flank pain, chronic 07/10/2017  . Hiatal hernia with GERD without esophagitis 03/10/2017  . Neuropathic pain 03/10/2017  . Vitamin D deficiency 03/10/2017  . Hyperlipidemia LDL goal <100 03/25/2013  . Hypertension 03/25/2013  . Hyperglycemia 11/01/2012      Haydee Monica, PT, DPT 11/14/20 1:32 PM  [facility] [address] [place], [place], [address] Phone: [phone]   Fax:  [phone]  Name: [name] MRN: [id number] Date of Birth: 11-Aug-1961

## 2020-11-16 ENCOUNTER — Ambulatory Visit: Payer: 59 | Admitting: Family Medicine

## 2020-11-23 DIAGNOSIS — L03019 Cellulitis of unspecified finger: Secondary | ICD-10-CM | POA: Diagnosis not present

## 2020-11-27 ENCOUNTER — Other Ambulatory Visit: Payer: Self-pay

## 2020-11-27 ENCOUNTER — Ambulatory Visit: Payer: 59

## 2020-11-27 DIAGNOSIS — R262 Difficulty in walking, not elsewhere classified: Secondary | ICD-10-CM | POA: Diagnosis not present

## 2020-11-27 DIAGNOSIS — M62830 Muscle spasm of back: Secondary | ICD-10-CM

## 2020-11-27 DIAGNOSIS — M2569 Stiffness of other specified joint, not elsewhere classified: Secondary | ICD-10-CM | POA: Diagnosis not present

## 2020-11-27 DIAGNOSIS — M545 Low back pain, unspecified: Secondary | ICD-10-CM | POA: Diagnosis not present

## 2020-11-27 NOTE — Therapy (Signed)
[place], [place], [address] Phone: [phone]   Fax:  [phone]  Physical Therapy Treatment  Patient Details  Name: [name] MRN: [id number] Date of Birth: 08/24/1961 Referring Provider (PT): Haydee Salter, MD   Encounter Date: 11/27/2020   PT End of Session - 11/27/20 1706    Visit Number 3    Number of Visits 13    Date for PT Re-Evaluation 01/05/21    Authorization Type [place] [employer]    Authorization Time Period re-assess FOTO on the 6th and 10th visits    Progress Note Due on Visit 10    PT Start Time 1703    PT Stop Time 1748    PT Time Calculation (min) 45 min    Activity Tolerance Patient tolerated treatment well    Behavior During Therapy [facility] for tasks assessed/performed           Past Medical History:  Diagnosis Date  . Diabetes mellitus without complication ([place])   . Endometriosis   . Fibroids    uterine  . GERD (gastroesophageal reflux disease)   . 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 History of hiatal hernia   . History of kidney stones 2014, 2017  . Hyperlipidemia   . Hypertension   . Neuromuscular disorder ([place]) ~2000   "exposed nerve" Lt flank  . Obesity (BMI 30-39.9)   . Peptic stricture of esophagus   . Uterine fibroid     Past Surgical History:  Procedure Laterality Date  . ABDOMINAL HYSTERECTOMY  2008  . BREAST CYST EXCISION  2007  . EXPLORATORY LAPAROTOMY  ~2005  . MASS EXCISION Left 07/11/2016   Procedure: EXCISION MASS LEFT UPPER ARM;  Surgeon: Armandina Gemma, MD;  Location: [place];  Service: General;  Laterality: Left;    There were no vitals filed for this visit.   Subjective Assessment - 11/27/20 1707    Subjective Doing better. Careful with lifting. Taking less medications.    Diagnostic tests 10/07/20: X-ray IMPRESSION:  1. Mild degenerative disc  disease most prominent at L4-L5.  2. L5-S1 facet hypertrophy.  3. Enlarged left transverse process of L5 and pseudoarticulation  with the sacrum.    Pain Score 2     Pain Location Back    Pain Orientation Posterior;Lower    Pain Descriptors / Indicators Aching    Pain Type Acute pain    Pain Onset More than a month ago    Pain Frequency Intermittent    Aggravating Factors  lifting                             [facility] Adult PT Treatment/Exercise - 11/27/20 0001      Exercises   Exercises Lumbar      Lumbar Exercises: Stretches   Active Hamstring Stretch Right;Left;2 reps;20 seconds    Active Hamstring Stretch Limitations seated    Single Knee to Chest Stretch Right;Left;3 reps;10 seconds    Lower Trunk Rotation 5 reps;10 seconds    Other Lumbar Stretch Exercise Child's pose c green Pball 10 sec stretches, 3x forward and laterally L and r      Lumbar Exercises: Supine   Pelvic Tilt 20 reps   3 sec   Heel Slides 10 reps    Heel Slides Limitations with green Pball  Bridge with [employer] 10 reps    Bridge with [employer] Limitations cues for core activation and to avoid arching back/lumbar EXT      Manual Therapy   Manual Therapy Joint mobilization;Soft tissue mobilization;Myofascial release    Joint Mobilization R UPA along lumbar TPs to facilitate L lumbar ROT (at L4 and L5 TPs primarily)    Soft tissue mobilization STM along L lumbar paraspinals                  PT Education - 11/27/20 1759    Education Details HEP updated    Person(s) Educated Patient    Methods Explanation;Demonstration;Tactile cues;Verbal cues;Handout    Comprehension Verbalized understanding;Returned demonstration;Verbal cues required;Tactile cues required            PT Short Term Goals - 11/08/20 0807      PT SHORT TERM GOAL #1   Title Pt will be Ind in an initial HEP    Baseline started on eval    Status New    Target Date 12/06/20      PT SHORT TERM GOAL #2    Title Pt will voice understanding of measures to reduce and manage low back pain.    Status New    Target Date 12/06/20      PT SHORT TERM GOAL #3   Title Pt will demonstrate proper body mechanics for household and work related activities    Status New    Target Date 12/06/20             PT Long Term Goals - 11/08/20 0812      PT LONG TERM GOAL #1   Title Pt will complete transfer, gait, and lifting activities without guarding and improved quality of movement    Status New    Target Date 01/05/21      PT LONG TERM GOAL #2   Title Pt will report an improvement in low back pain to 2/10 or less with daily activites of home and work    Baseline 4-5/10    Status New    Target Date 01/05/21      PT LONG TERM GOAL #3   Title Pt will demonstrate improved ROM of her back by 25% with all motions    Baseline see flowsheets    Status New    Target Date 01/05/21      PT LONG TERM GOAL #4   Title Pt's FOTO score will improve to the predicted value of 63% functional ability    Baseline 47% functional ability    Status New    Target Date 01/05/21                 Plan - 11/27/20 1800    Clinical Impression Statement Pt returns to PT reporting she is not experiencing as much pain with L her low back. Pt reports consistent completion of her HEP and borrowing a Pball from her neighbor. PT was continued to lumbopelvic flexibility and and strengthening per ther ex, UPAs to the R lumbar vertebrae, and STM to the lumbar paraspinals. Pt's HEP was updated. Pt reported her L low back was feeling better post session.    Personal Factors and Comorbidities Comorbidity 3+;Comorbidity 2;Comorbidity 1    Comorbidities Low back degenerative changes for low back, DM, high BMI    Examination-Activity Limitations Locomotion Level;Bend;Carry;Lift    Examination-Participation Restrictions Occupation    Stability/Clinical Decision Making Stable/Uncomplicated    Clinical Decision Making Low  Rehab  Potential Good    PT Frequency 2x / week    PT Duration 6 weeks    PT Treatment/Interventions ADLs/Self Care Home Management;Cryotherapy;Electrical Stimulation;Ultrasound;Traction;Moist Heat;Iontophoresis 4mg /ml Dexamethasone;Therapeutic activities;Therapeutic exercise;Manual techniques;Patient/family education;Dry needling;Taping;Spinal Manipulations    PT Next Visit Plan Assess response to HEP and update PRN. Progress ther ex as indicated. Utilize manual techniques and modalities as indicated.    PT Home Exercise Plan [id number]    Consulted and Agree with Plan of Care Patient           Patient will benefit from skilled therapeutic intervention in order to improve the following deficits and impairments:  Difficulty walking,Increased muscle spasms,Obesity,Decreased activity tolerance,Pain,Decreased mobility,Decreased range of motion,Decreased strength  Visit Diagnosis: Acute bilateral low back pain without sciatica  Muscle spasm of back  Decreased ROM of trunk and back  Difficulty in walking, not elsewhere classified     Problem List Patient Active Problem List   Diagnosis Date Noted  . Acute bilateral low back pain without sciatica 10/12/2020  . Type 2 diabetes mellitus without complication, without long-term current use of insulin ([place]) 07/10/2017  . Left flank pain, chronic 07/10/2017  . Hiatal hernia with GERD without esophagitis 03/10/2017  . Neuropathic pain 03/10/2017  . Vitamin D deficiency 03/10/2017  . Hyperlipidemia LDL goal <100 03/25/2013  . Hypertension 03/25/2013  . Hyperglycemia 11/01/2012   Gar Ponto MS, PT 11/27/20 6:11 PM  [facility] [address] [place], [place], [address] Phone: [phone]   Fax:  [phone]  Name: [name] MRN: [id number] Date of Birth: 07-27-1961

## 2020-11-29 ENCOUNTER — Other Ambulatory Visit: Payer: Self-pay

## 2020-11-29 ENCOUNTER — Ambulatory Visit: Payer: 59

## 2020-11-29 DIAGNOSIS — R262 Difficulty in walking, not elsewhere classified: Secondary | ICD-10-CM

## 2020-11-29 DIAGNOSIS — M62830 Muscle spasm of back: Secondary | ICD-10-CM

## 2020-11-29 DIAGNOSIS — M545 Low back pain, unspecified: Secondary | ICD-10-CM

## 2020-11-29 DIAGNOSIS — M2569 Stiffness of other specified joint, not elsewhere classified: Secondary | ICD-10-CM

## 2020-11-29 NOTE — Therapy (Signed)
[place], [place], [address] Phone: [phone]   Fax:  [phone]  Physical Therapy Treatment  Patient Details  Name: [name] MRN: [id number] Date of Birth: 14-Mar-1961 Referring Provider (PT): Haydee Salter, MD   Encounter Date: 11/29/2020   PT End of Session - 11/29/20 1633    Visit Number 4    Number of Visits 13    Date for PT Re-Evaluation 01/05/21    Authorization Type [place] [employer]    Authorization Time Period re-assess FOTO on the 6th and 10th visits    Progress Note Due on Visit 10    PT Start Time 1532    PT Stop Time 1613    PT Time Calculation (min) 41 min    Activity Tolerance Patient tolerated treatment well    Behavior During Therapy [facility] for tasks assessed/performed           Past Medical History:  Diagnosis Date  . Diabetes mellitus without complication ([place])   . Endometriosis   . Fibroids    uterine  . GERD (gastroesophageal reflux disease)   . 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 History of hiatal hernia   . History of kidney stones 2014, 2017  . Hyperlipidemia   . Hypertension   . Neuromuscular disorder ([place]) ~2000   "exposed nerve" Lt flank  . Obesity (BMI 30-39.9)   . Peptic stricture of esophagus   . Uterine fibroid     Past Surgical History:  Procedure Laterality Date  . ABDOMINAL HYSTERECTOMY  2008  . BREAST CYST EXCISION  2007  . EXPLORATORY LAPAROTOMY  ~2005  . MASS EXCISION Left 07/11/2016   Procedure: EXCISION MASS LEFT UPPER ARM;  Surgeon: Armandina Gemma, MD;  Location: [place];  Service: General;  Laterality: Left;    There were no vitals filed for this visit.   Subjective Assessment - 11/29/20 1536    Subjective Pt reports being sore but also having some pain after previous session. "The exercise with the ball between my legs didn't feel too good  after." Has not taken pain medication 11/13/2020.    Limitations Lifting;Walking    How long can you sit comfortably? Not an issue    How long can you stand comfortably? Not an issue    How long can you walk comfortably? 10 mins    Diagnostic tests 10/07/20: X-ray IMPRESSION:  1. Mild degenerative disc disease most prominent at L4-L5.  2. L5-S1 facet hypertrophy.  3. Enlarged left transverse process of L5 and pseudoarticulation  with the sacrum.    Currently in Pain? Yes    Pain Score 3     Pain Location Back    Pain Orientation Posterior;Lower;Left    Pain Descriptors / Indicators Aching    Pain Type Acute pain    Pain Onset More than a month ago              [facility] PT Assessment - 11/29/20 0001      Assessment   Medical Diagnosis Acute bilateral low back pain without sciatica    Referring Provider (PT) Haydee Salter, MD    Onset Date/Surgical Date 10/06/20                         [facility] Adult PT Treatment/Exercise - 11/29/20 0001  Lumbar Exercises: Stretches   Double Knee to Chest Stretch Limitations 15x slow and controlled motion with green swiss ball    Lower Trunk Rotation --   10 x 3 sec in each direction     Lumbar Exercises: Aerobic   Nustep L3 x 5 min LE only      Lumbar Exercises: Supine   Pelvic Tilt 20 reps   3 seconds   Clam 20 reps    Clam Limitations green theraband    Dead Bug Limitations dead bug with green physioball placed on abdomen with LE extension and opposite UE FL x 15 each; cues for low back against mat      Lumbar Exercises: Sidelying   Other Sidelying Lumbar Exercises open book L rotation x 20      Lumbar Exercises: Quadruped   Madcat/Old Horse 15 reps    Madcat/Old Horse Limitations tactile, visual, and verbal cues for technique    Other Quadruped Lumbar Exercises forward and lateral sidebending (right sidebending) 5 x 5 sec holds each direction      Manual Therapy   Manual Therapy Joint mobilization;Soft tissue  mobilization;Myofascial release    Joint Mobilization R UPA along lumbar TPs to facilitate L lumbar ROT (at L4 and L5 TPs primarily)    Soft tissue mobilization STM along L lumbar paraspinals and L quadratus lumborum                  PT Education - 11/29/20 1638    Education Details Updated and reviewed HEP    Person(s) Educated Patient    Methods Explanation;Demonstration;Tactile cues;Verbal cues;Handout    Comprehension Verbalized understanding;Returned demonstration;Verbal cues required;Tactile cues required            PT Short Term Goals - 11/08/20 0807      PT SHORT TERM GOAL #1   Title Pt will be Ind in an initial HEP    Baseline started on eval    Status New    Target Date 12/06/20      PT SHORT TERM GOAL #2   Title Pt will voice understanding of measures to reduce and manage low back pain.    Status New    Target Date 12/06/20      PT SHORT TERM GOAL #3   Title Pt will demonstrate proper body mechanics for household and work related activities    Status New    Target Date 12/06/20             PT Long Term Goals - 11/08/20 0812      PT LONG TERM GOAL #1   Title Pt will complete transfer, gait, and lifting activities without guarding and improved quality of movement    Status New    Target Date 01/05/21      PT LONG TERM GOAL #2   Title Pt will report an improvement in low back pain to 2/10 or less with daily activites of home and work    Baseline 4-5/10    Status New    Target Date 01/05/21      PT LONG TERM GOAL #3   Title Pt will demonstrate improved ROM of her back by 25% with all motions    Baseline see flowsheets    Status New    Target Date 01/05/21      PT LONG TERM GOAL #4   Title Pt's FOTO score will improve to the predicted value of 63% functional ability    Baseline 47% functional ability  Status New    Target Date 01/05/21                 Plan - 11/29/20 1633    Clinical Impression Statement Patient tolerated PT  session well with no adverse effects or complaints of increased pain. Progressed core strengthening and spinal mobility interventions with addition of deadbugs with green physioball and cat camel. She responded well to UPAs to R lumbar TPs followed by L open book rotations. She reports her left low back felt "pretty good" when asked following interventions. She should continue to benefit from skilled PT intervention for pain reduction and improved tolerance with functional activities.    Personal Factors and Comorbidities Comorbidity 3+;Comorbidity 2;Comorbidity 1    Comorbidities Low back degenerative changes for low back, DM, high BMI    Examination-Activity Limitations Locomotion Level;Bend;Carry;Lift    Examination-Participation Restrictions Occupation    Stability/Clinical Decision Making Stable/Uncomplicated    Rehab Potential Good    PT Frequency 2x / week    PT Duration 6 weeks    PT Treatment/Interventions ADLs/Self Care Home Management;Cryotherapy;Electrical Stimulation;Ultrasound;Traction;Moist Heat;Iontophoresis 4mg /ml Dexamethasone;Therapeutic activities;Therapeutic exercise;Manual techniques;Patient/family education;Dry needling;Taping;Spinal Manipulations    PT Next Visit Plan Assess response to HEP and update PRN. Progress ther ex/core strengthening as tolerated. Utilize manual techniques and modalities as indicated.    PT Home Exercise Plan [id number]    Consulted and Agree with Plan of Care Patient           Patient will benefit from skilled therapeutic intervention in order to improve the following deficits and impairments:  Difficulty walking,Increased muscle spasms,Obesity,Decreased activity tolerance,Pain,Decreased mobility,Decreased range of motion,Decreased strength  Visit Diagnosis: Acute bilateral low back pain without sciatica  Muscle spasm of back  Decreased ROM of trunk and back  Difficulty in walking, not elsewhere classified     Problem List Patient  Active Problem List   Diagnosis Date Noted  . Acute bilateral low back pain without sciatica 10/12/2020  . Type 2 diabetes mellitus without complication, without long-term current use of insulin ([place]) 07/10/2017  . Left flank pain, chronic 07/10/2017  . Hiatal hernia with GERD without esophagitis 03/10/2017  . Neuropathic pain 03/10/2017  . Vitamin D deficiency 03/10/2017  . Hyperlipidemia LDL goal <100 03/25/2013  . Hypertension 03/25/2013  . Hyperglycemia 11/01/2012     Haydee Monica, PT, DPT 11/29/20 4:40 PM  [facility] [address] [place], [place], [address] Phone: [phone]   Fax:  [phone]  Name: [name] MRN: [id number] Date of Birth: [birth year]

## 2020-11-30 ENCOUNTER — Ambulatory Visit: Payer: 59 | Admitting: Family Medicine

## 2020-12-04 ENCOUNTER — Other Ambulatory Visit: Payer: Self-pay

## 2020-12-04 ENCOUNTER — Ambulatory Visit: Payer: 59

## 2020-12-04 DIAGNOSIS — M62830 Muscle spasm of back: Secondary | ICD-10-CM

## 2020-12-04 DIAGNOSIS — R262 Difficulty in walking, not elsewhere classified: Secondary | ICD-10-CM | POA: Diagnosis not present

## 2020-12-04 DIAGNOSIS — M545 Low back pain, unspecified: Secondary | ICD-10-CM

## 2020-12-04 DIAGNOSIS — M2569 Stiffness of other specified joint, not elsewhere classified: Secondary | ICD-10-CM | POA: Diagnosis not present

## 2020-12-04 NOTE — Therapy (Signed)
[place], [place], [address] Phone: [phone]   Fax:  [phone]  Physical Therapy Treatment  Patient Details  Name: [name] MRN: [id number] Date of Birth: March 27, 1961 Referring Provider (PT): Haydee Salter, MD   Encounter Date: 12/04/2020   PT End of Session - 12/04/20 1533    Visit Number 5    Number of Visits 13    Date for PT Re-Evaluation 01/05/21    Authorization Type [employer]    Authorization Time Period re-assess FOTO on the 6th and 10th visits    Progress Note Due on Visit 10    PT Start Time 1533   pt arrived late   PT Stop Time 1615    PT Time Calculation (min) 42 min    Activity Tolerance Patient tolerated treatment well    Behavior During Therapy [facility] for tasks assessed/performed           Past Medical History:  Diagnosis Date  . Diabetes mellitus without complication ([place])   . Endometriosis   . Fibroids    uterine  . GERD (gastroesophageal reflux disease)   . 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 History of hiatal hernia   . History of kidney stones 2014, 2017  . Hyperlipidemia   . Hypertension   . Neuromuscular disorder ([place]) ~2000   "exposed nerve" Lt flank  . Obesity (BMI 30-39.9)   . Peptic stricture of esophagus   . Uterine fibroid     Past Surgical History:  Procedure Laterality Date  . ABDOMINAL HYSTERECTOMY  2008  . BREAST CYST EXCISION  2007  . EXPLORATORY LAPAROTOMY  ~2005  . MASS EXCISION Left 07/11/2016   Procedure: EXCISION MASS LEFT UPPER ARM;  Surgeon: Armandina Gemma, MD;  Location: [place];  Service: General;  Laterality: Left;    There were no vitals filed for this visit.   Subjective Assessment - 12/04/20 1533    Subjective "I will say I did not hurt the next day after my last session, and I was shocked. I was sore that night but didn't have to  take any pain medication."    Limitations Lifting;Walking    How long can you sit comfortably? Not an issue    How long can you stand comfortably? Not an issue    How long can you walk comfortably? 10 mins    Diagnostic tests 10/07/20: X-ray IMPRESSION:  1. Mild degenerative disc disease most prominent at L4-L5.  2. L5-S1 facet hypertrophy.  3. Enlarged left transverse process of L5 and pseudoarticulation  with the sacrum.    Currently in Pain? Yes    Pain Score 2     Pain Location Back    Pain Orientation Posterior;Lower;Left    Pain Descriptors / Indicators Aching    Pain Onset More than a month ago              [facility] PT Assessment - 12/04/20 0001      Assessment   Medical Diagnosis Acute bilateral low back pain without sciatica    Referring Provider (PT) Haydee Salter, MD    Onset Date/Surgical Date 10/06/20                         [facility] Adult PT Treatment/Exercise - 12/04/20 0001      Self-Care  Self-Care Other Self-Care Comments    Other Self-Care Comments  See patient education      Lumbar Exercises: Stretches   Hip Flexor Stretch Right;Left;1 rep;Other (comment)   90 seconds   Hip Flexor Stretch Limitations R Thomas stretch; L hip flexor stretch while lunging forward with RLE      Lumbar Exercises: Aerobic   Nustep L4 x 5 min LE only      Lumbar Exercises: Standing   Other Standing Lumbar Exercises Pallof press with 2 red therabands x 10 each direction with cues for core activation and form      Lumbar Exercises: Supine   Pelvic Tilt 20 reps   3 second hold   Pelvic Tilt Limitations tactile and verbal cues    Clam 20 reps    Clam Limitations green theraband    Bent Knee Raise 15 reps   15x each LE   Bent Knee Raise Limitations cues to maintain low back against mat    Dead Bug Limitations dead bug with green physioball placed on abdomen with LE extension and opposite UE FL x 15 each; cues for low back against mat    Bridge 15 reps    Bridge  Limitations cues for glute activation      Lumbar Exercises: Sidelying   Other Sidelying Lumbar Exercises open book L rotation x 15      Manual Therapy   Manual Therapy Joint mobilization    Joint Mobilization Left lumbar rotation mobilizations and gentle overpressure grades III-IV with patient in right sidelying with left upper trunk rotation                  PT Education - 12/04/20 1653    Education Details Updated and reviewed HEP; provided red therabands for pallof press    Person(s) Educated Patient    Methods Explanation;Demonstration;Tactile cues;Verbal cues;Handout    Comprehension Verbalized understanding;Returned demonstration;Verbal cues required;Tactile cues required            PT Short Term Goals - 12/04/20 1657      PT SHORT TERM GOAL #1   Title Pt will be Ind in an initial HEP    Baseline verbalizes compliance with HEP    Status Achieved    Target Date 12/06/20      PT SHORT TERM GOAL #2   Title Pt will voice understanding of measures to reduce and manage low back pain.    Status Achieved    Target Date 12/06/20      PT SHORT TERM GOAL #3   Title Pt will demonstrate proper body mechanics for household and work related activities    Baseline improved body mechanics when performing interventions in clinic - will assess body mechanics for household/work activities next session    Status Partially Met    Target Date 12/06/20             PT Long Term Goals - 11/08/20 0812      PT LONG TERM GOAL #1   Title Pt will complete transfer, gait, and lifting activities without guarding and improved quality of movement    Status New    Target Date 01/05/21      PT LONG TERM GOAL #2   Title Pt will report an improvement in low back pain to 2/10 or less with daily activites of home and work    Baseline 4-5/10    Status New    Target Date 01/05/21      PT LONG TERM  GOAL #3   Title Pt will demonstrate improved ROM of her back by 25% with all motions     Baseline see flowsheets    Status New    Target Date 01/05/21      PT LONG TERM GOAL #4   Title Pt's FOTO score will improve to the predicted value of 63% functional ability    Baseline 47% functional ability    Status New    Target Date 01/05/21                 Plan - 12/04/20 1540    Clinical Impression Statement Patient tolerated PT session well with no adverse effects or complaints of increased pain. Continued core strengthening with addition of pallof press. She responded well to brief manual techniques to facilitate increased left upper trunk rotation and demonstrated improved motion when performing open book (L rotation) afterwards compared to before. She should continue to benefit from skilled PT intervention for pain reduction and to maximize function.    Personal Factors and Comorbidities Comorbidity 3+;Comorbidity 2;Comorbidity 1    Comorbidities Low back degenerative changes for low back, DM, high BMI    Examination-Activity Limitations Locomotion Level;Bend;Carry;Lift    Examination-Participation Restrictions Occupation    Stability/Clinical Decision Making Stable/Uncomplicated    Rehab Potential Good    PT Frequency 2x / week    PT Duration 6 weeks    PT Treatment/Interventions ADLs/Self Care Home Management;Cryotherapy;Electrical Stimulation;Ultrasound;Traction;Moist Heat;Iontophoresis 61m/ml Dexamethasone;Therapeutic activities;Therapeutic exercise;Manual techniques;Patient/family education;Dry needling;Taping;Spinal Manipulations    PT Next Visit Plan Assess response to HEP and update PRN. Progress ther ex/core strengthening as tolerated. Utilize manual techniques and modalities as indicated. Initiate squatting, lifting, deadlifts as tolerated    PT Home Exercise Plan [id number]    Consulted and Agree with Plan of Care Patient           Patient will benefit from skilled therapeutic intervention in order to improve the following deficits and impairments:   Difficulty walking,Increased muscle spasms,Obesity,Decreased activity tolerance,Pain,Decreased mobility,Decreased range of motion,Decreased strength  Visit Diagnosis: Acute bilateral low back pain without sciatica  Muscle spasm of back  Decreased ROM of trunk and back  Difficulty in walking, not elsewhere classified     Problem List Patient Active Problem List   Diagnosis Date Noted  . Acute bilateral low back pain without sciatica 10/12/2020  . Type 2 diabetes mellitus without complication, without long-term [place] use of insulin [place] 07/10/2017  . Left flank pain, chronic 07/10/2017  . Hiatal hernia with GERD without esophagitis 03/10/2017  . Neuropathic pain 03/10/2017  . Vitamin D deficiency 03/10/2017  . Hyperlipidemia LDL goal <100 03/25/2013  . Hypertension 03/25/2013  . Hyperglycemia 11/01/2012     KHaydee Monica PT, DPT 12/04/20 4:59 PM  [facility] [address]Phone: [phone]  Fax:  [phone] Name: Summer MCCORVEYMRN: [id number] of Birth: [birth year]

## 2020-12-06 ENCOUNTER — Ambulatory Visit: Payer: 59

## 2020-12-06 ENCOUNTER — Other Ambulatory Visit: Payer: Self-pay

## 2020-12-06 DIAGNOSIS — R262 Difficulty in walking, not elsewhere classified: Secondary | ICD-10-CM | POA: Diagnosis not present

## 2020-12-06 DIAGNOSIS — M545 Low back pain, unspecified: Secondary | ICD-10-CM

## 2020-12-06 DIAGNOSIS — M62830 Muscle spasm of back: Secondary | ICD-10-CM

## 2020-12-06 DIAGNOSIS — M2569 Stiffness of other specified joint, not elsewhere classified: Secondary | ICD-10-CM | POA: Diagnosis not present

## 2020-12-06 NOTE — Therapy (Signed)
[place], [place], [address] Phone: [phone]   Fax:  [phone]  Physical Therapy Treatment  Patient Details  Name: SHONTERIA ABELN MRN: [id number] Date of Birth: October 03, 1960 Referring Provider (PT): Haydee Salter, MD   Encounter Date: 12/06/2020   PT End of Session - 12/06/20 1615    Visit Number 6    Number of Visits 13    Date for PT Re-Evaluation 01/05/21    Authorization Type [place] [employer]    Authorization Time Period re-assess FOTO on the 6th and 10th visits    Progress Note Due on Visit 10    PT Start Time 1615    PT Stop Time 1654    PT Time Calculation (min) 39 min    Activity Tolerance Patient tolerated treatment well    Behavior During Therapy [facility] for tasks assessed/performed           Past Medical History:  Diagnosis Date  . Diabetes mellitus without complication ([place])   . Endometriosis   . Fibroids    uterine  . GERD (gastroesophageal reflux disease)   . 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 History of hiatal hernia   . History of kidney stones 2014, 2017  . Hyperlipidemia   . Hypertension   . Neuromuscular disorder ([place]) ~2000   "exposed nerve" Lt flank  . Obesity (BMI 30-39.9)   . Peptic stricture of esophagus   . Uterine fibroid     Past Surgical History:  Procedure Laterality Date  . ABDOMINAL HYSTERECTOMY  2008  . BREAST CYST EXCISION  2007  . EXPLORATORY LAPAROTOMY  ~2005  . MASS EXCISION Left 07/11/2016   Procedure: EXCISION MASS LEFT UPPER ARM;  Surgeon: Armandina Gemma, MD;  Location: [place];  Service: General;  Laterality: Left;    There were no vitals filed for this visit.   Subjective Assessment - 12/06/20 1618    Limitations Lifting;Walking    How long can you sit comfortably? Not an issue    How long can you stand comfortably? Not an issue    How long can you  walk comfortably? 10 mins    Diagnostic tests 10/07/20: X-ray IMPRESSION:  1. Mild degenerative disc disease most prominent at L4-L5.  2. L5-S1 facet hypertrophy.  3. Enlarged left transverse process of L5 and pseudoarticulation  with the sacrum.    Pain Onset More than a month ago              [facility] PT Assessment - 12/06/20 0001      Assessment   Medical Diagnosis Acute bilateral low back pain without sciatica    Referring Provider (PT) Haydee Salter, MD    Onset Date/Surgical Date 10/06/20      Observation/Other Assessments   Focus on Therapeutic Outcomes (FOTO)  61% function; predicted 63% function      AROM   Lumbar Flexion fingertips at level of malleoli    Lumbar Extension WFL but painful in left low back    Lumbar - Right Side Bend fingertips to inferior patellar pole    Lumbar - Left Side Bend fingertips to inferior patellar pole    Lumbar - Right Rotation WFL    Lumbar - Left Rotation [facility]      Strength   Overall Strength Comments BLE MMT 4+/5 - 5/5 grossly without pain  [place] Adult PT Treatment/Exercise - 12/06/20 0001      Self-Care   Self-Care Other Self-Care Comments    Other Self-Care Comments  See patient education      Lumbar Exercises: Aerobic   Nustep L6 x 5 min LE only      Lumbar Exercises: Seated   Other Seated Lumbar Exercises pallof press with green band x 15 each direction while seated on green physioball    Other Seated Lumbar Exercises seated PPT on low mat then on green physioball      Lumbar Exercises: Supine   Bent Knee Raise 15 reps   15x each LE     Lumbar Exercises: Sidelying   Hip Abduction Both;10 reps   2 x 10   Other Sidelying Lumbar Exercises open book L rotation x 15      Lumbar Exercises: Quadruped   Other Quadruped Lumbar Exercises bird dog with alternating LE only x 15 each LE      Manual Therapy   Manual Therapy Joint mobilization    Joint Mobilization Left lumbar rotation  mobilizations and gentle overpressure grades III-IV with patient in right sidelying with left upper trunk rotation                  PT Education - 12/06/20 1704    Education Details Updated and reviewed HEP    Person(s) Educated Patient    Methods Explanation;Demonstration;Handout;Verbal cues;Tactile cues    Comprehension Verbalized understanding;Returned demonstration;Verbal cues required;Tactile cues required            PT Short Term Goals - 12/06/20 1703      PT SHORT TERM GOAL #1   Title Pt will be Ind in an initial HEP    Baseline verbalizes compliance with HEP    Status Achieved    Target Date 12/06/20      PT SHORT TERM GOAL #2   Title Pt will voice understanding of measures to reduce and manage low back pain.    Status Achieved    Target Date 12/06/20      PT SHORT TERM GOAL #3   Title Pt will demonstrate proper body mechanics for household and work related activities    Baseline tactile, visual, and verbal cues for core activation and postural control - reviewed posture while at work    Status Partially Met    Target Date 12/06/20             PT Long Term Goals - 11/08/20 0812      PT LONG TERM GOAL #1   Title Pt will complete transfer, gait, and lifting activities without guarding and improved quality of movement    Status New    Target Date 01/05/21      PT LONG TERM GOAL #2   Title Pt will report an improvement in low back pain to 2/10 or less with daily activites of home and work    Baseline 4-5/10    Status New    Target Date 01/05/21      PT LONG TERM GOAL #3   Title Pt will demonstrate improved ROM of her back by 25% with all motions    Baseline see flowsheets    Status New    Target Date 01/05/21      PT LONG TERM GOAL #4   Title Pt's FOTO score will improve to the predicted value of 63% functional ability    Baseline 47% functional ability    Status New  Target Date 01/05/21                 Plan - 12/06/20 1650     Clinical Impression Statement Patient tolerated PT session with no adverse effects or complaints of increased pain. She mentions having pain with the way she sits at work, which she demonstrates as slouching and leaning forward to complete tasks. Emphasized sitting posture and initiated seated posterior pelvic tilts and core activation. Her FOTO score has improved from 45% to 61% functional status with patient having difficulty with heavy activities, carrying groceries, walking prolonged distances, and climbing several flights of stairs.    Personal Factors and Comorbidities Comorbidity 3+;Comorbidity 2;Comorbidity 1    Comorbidities Low back degenerative changes for low back, DM, high BMI    Examination-Activity Limitations Locomotion Level;Bend;Carry;Lift    Examination-Participation Restrictions Occupation    Stability/Clinical Decision Making Stable/Uncomplicated    Rehab Potential Good    PT Frequency 2x / week    PT Duration 6 weeks    PT Treatment/Interventions ADLs/Self Care Home Management;Cryotherapy;Electrical Stimulation;Ultrasound;Traction;Moist Heat;Iontophoresis 6m/ml Dexamethasone;Therapeutic activities;Therapeutic exercise;Manual techniques;Patient/family education;Dry needling;Taping;Spinal Manipulations    PT Next Visit Plan Assess response to HEP and update PRN. Progress ther ex/core strengthening as tolerated. Utilize manual techniques and modalities as indicated. Initiate squatting, lifting, deadlifts as tolerated    PT Home Exercise Plan [id number]    Consulted and Agree with Plan of Care Patient           Patient will benefit from skilled therapeutic intervention in order to improve the following deficits and impairments:  Difficulty walking,Increased muscle spasms,Obesity,Decreased activity tolerance,Pain,Decreased mobility,Decreased range of motion,Decreased strength  Visit Diagnosis: Acute bilateral low back pain without sciatica  Muscle spasm of back  Decreased ROM  of trunk and back  Difficulty in walking, not elsewhere classified     Problem List Patient Active Problem List   Diagnosis Date Noted  . Acute bilateral low back pain without sciatica 10/12/2020  . Type 2 diabetes mellitus without complication, without long-term current use of insulin ([place] 07/10/2017  . Left flank pain, chronic 07/10/2017  . Hiatal hernia with GERD without esophagitis 03/10/2017  . Neuropathic pain 03/10/2017  . Vitamin D deficiency 03/10/2017  . Hyperlipidemia LDL goal <100 03/25/2013  . Hypertension 03/25/2013  . Hyperglycemia 11/01/2012    KHaydee Monica PT, DPT 12/06/20 5:08 PM  [facility] [place] [address]Phone: [phone]  Fax:  [phone] Name: WELLYANNA HOLTONMRN: [id number] of Birth: 116-Mar-1962

## 2020-12-08 ENCOUNTER — Other Ambulatory Visit (HOSPITAL_COMMUNITY): Payer: Self-pay

## 2020-12-11 ENCOUNTER — Other Ambulatory Visit: Payer: Self-pay

## 2020-12-11 ENCOUNTER — Ambulatory Visit: Payer: 59 | Attending: Family Medicine

## 2020-12-11 DIAGNOSIS — M62830 Muscle spasm of back: Secondary | ICD-10-CM

## 2020-12-11 DIAGNOSIS — M545 Low back pain, unspecified: Secondary | ICD-10-CM | POA: Diagnosis not present

## 2020-12-11 DIAGNOSIS — M2569 Stiffness of other specified joint, not elsewhere classified: Secondary | ICD-10-CM | POA: Diagnosis not present

## 2020-12-11 DIAGNOSIS — R262 Difficulty in walking, not elsewhere classified: Secondary | ICD-10-CM

## 2020-12-11 NOTE — Therapy (Signed)
[place], [place], [address] Phone: [phone]   Fax:  [phone]  Physical Therapy Treatment  Patient Details  Name: [name] MRN: [id number] Date of Birth: November 25, 1960 Referring Provider (PT): Haydee Salter, MD   Encounter Date: 12/11/2020   PT End of Session - 12/11/20 1552    Visit Number 7    Number of Visits 13    Date for PT Re-Evaluation 01/05/21    Authorization Type [place] [employer]    Authorization Time Period re-assess FOTO on the 6th and 10th visits    Progress Note Due on Visit 10    PT Start Time 1547    PT Stop Time 1630    PT Time Calculation (min) 43 min    Activity Tolerance Patient tolerated treatment well    Behavior During Therapy [facility] for tasks assessed/performed           Past Medical History:  Diagnosis Date  . Diabetes mellitus without complication ([place])   . Endometriosis   . Fibroids    uterine  . GERD (gastroesophageal reflux disease)   . 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 History of hiatal hernia   . History of kidney stones 2014, 2017  . Hyperlipidemia   . Hypertension   . Neuromuscular disorder ([place]) ~2000   "exposed nerve" Lt flank  . Obesity (BMI 30-39.9)   . Peptic stricture of esophagus   . Uterine fibroid     Past Surgical History:  Procedure Laterality Date  . ABDOMINAL HYSTERECTOMY  2008  . BREAST CYST EXCISION  2007  . EXPLORATORY LAPAROTOMY  ~2005  . MASS EXCISION Left 07/11/2016   Procedure: EXCISION MASS LEFT UPPER ARM;  Surgeon: Armandina Gemma, MD;  Location: [place];  Service: General;  Laterality: Left;    There were no vitals filed for this visit.   Subjective Assessment - 12/11/20 1551    Subjective Pt reports her R shoulder started hurting her yesterday, unsure why. Her left lower back has bgeun to get more sore the last couple of days.  She doesn't know if it's related to swelling or moving wrong.    Limitations Lifting;Walking    How long can you sit comfortably? Not an issue    How long can you stand comfortably? Not an issue    How long can you walk comfortably? 10 mins    Diagnostic tests 10/07/20: X-ray IMPRESSION:  1. Mild degenerative disc disease most prominent at L4-L5.  2. L5-S1 facet hypertrophy.  3. Enlarged left transverse process of L5 and pseudoarticulation  with the sacrum.    Currently in Pain? Yes    Pain Score 3     Pain Location Back    Pain Orientation Posterior;Lower;Left    Pain Descriptors / Indicators Aching    Pain Onset More than a month ago                             [facility] Adult PT Treatment/Exercise - 12/11/20 0001      Self-Care   Other Self-Care Comments  See patient education      Lumbar Exercises: Aerobic   Nustep L6 x 5 min LE only      Lumbar Exercises: Standing   Other Standing Lumbar Exercises thread the needle with FR on mat table  to hip height      Lumbar Exercises: Supine   Other Supine Lumbar Exercises T/S extension over foam roller: bra strap T8ish, just above, just below x10 each      Lumbar Exercises: Prone   Other Prone Lumbar Exercises Mini Swan x6 (prone serratus slides + thoracic extension)    Other Prone Lumbar Exercises Child's pose after mini swan      Manual Therapy   Manual Therapy Joint mobilization    Joint Mobilization L lumbar gapping grade 3-4, thoracic PAs in prone grade 3-4                  PT Education - 12/11/20 1645    Education Details Updated HEP (advised to bring in to start to cut down), educated on mckenzie lumbar roll and foam roller    Person(s) Educated Patient    Methods Explanation;Demonstration;Verbal cues;Tactile cues;Handout    Comprehension Verbalized understanding;Returned demonstration;Verbal cues required;Tactile cues required            PT Short Term Goals - 12/06/20 1703      PT SHORT TERM  GOAL #1   Title Pt will be Ind in an initial HEP    Baseline verbalizes compliance with HEP    Status Achieved    Target Date 12/06/20      PT SHORT TERM GOAL #2   Title Pt will voice understanding of measures to reduce and manage low back pain.    Status Achieved    Target Date 12/06/20      PT SHORT TERM GOAL #3   Title Pt will demonstrate proper body mechanics for household and work related activities    Baseline tactile, visual, and verbal cues for core activation and postural control - reviewed posture while at work    Status Partially Met    Target Date 12/06/20             PT Long Term Goals - 11/08/20 0812      PT LONG TERM GOAL #1   Title Pt will complete transfer, gait, and lifting activities without guarding and improved quality of movement    Status New    Target Date 01/05/21      PT LONG TERM GOAL #2   Title Pt will report an improvement in low back pain to 2/10 or less with daily activites of home and work    Baseline 4-5/10    Status New    Target Date 01/05/21      PT LONG TERM GOAL #3   Title Pt will demonstrate improved ROM of her back by 25% with all motions    Baseline see flowsheets    Status New    Target Date 01/05/21      PT LONG TERM GOAL #4   Title Pt's FOTO score will improve to the predicted value of 63% functional ability    Baseline 47% functional ability    Status New    Target Date 01/05/21                 Plan - 12/11/20 1552    Clinical Impression Statement Pt responded well to foam roller thoracic mobility today to increase rotation and extension, reporting improvement in back and shoulders afterward. Educated pt on use of McKenzie lumbar roll in car or chair to improve spinal alignment and decreased stress to lumbar spine, as well as foam roller use for home (emailed suggested links). She had good tolerance to session. Initiated  prone thoracic extension with arms overhead ([place] mini swan).    Personal Factors and  Comorbidities Comorbidity 3+;Comorbidity 2;Comorbidity 1    Comorbidities Low back degenerative changes for low back, DM, high BMI    Examination-Activity Limitations Locomotion Level;Bend;Carry;Lift    Examination-Participation Restrictions Occupation    Stability/Clinical Decision Making Stable/Uncomplicated    Rehab Potential Good    PT Frequency 2x / week    PT Duration 6 weeks    PT Treatment/Interventions ADLs/Self Care Home Management;Cryotherapy;Electrical Stimulation;Ultrasound;Traction;Moist Heat;Iontophoresis 41m/ml Dexamethasone;Therapeutic activities;Therapeutic exercise;Manual techniques;Patient/family education;Dry needling;Taping;Spinal Manipulations    PT Next Visit Plan Assess response to HEP and update PRN. Progress ther ex/core strengthening as tolerated. Utilize manual techniques and modalities as indicated. Initiate squatting, lifting, deadlifts as tolerated    PT Home Exercise Plan [id number]    Consulted and Agree with Plan of Care Patient           Patient will benefit from skilled therapeutic intervention in order to improve the following deficits and impairments:  Difficulty walking,Increased muscle spasms,Obesity,Decreased activity tolerance,Pain,Decreased mobility,Decreased range of motion,Decreased strength  Visit Diagnosis: Acute bilateral low back pain without sciatica  Muscle spasm of back  Decreased ROM of trunk and back  Difficulty in walking, not elsewhere classified     Problem List Patient Active Problem List   Diagnosis Date Noted  . Acute bilateral low back pain without sciatica 10/12/2020  . Type 2 diabetes mellitus without complication, without long-term current use of insulin ([place] 07/10/2017  . Left flank pain, chronic 07/10/2017  . Hiatal hernia with GERD without esophagitis 03/10/2017  . Neuropathic pain 03/10/2017  . Vitamin D deficiency 03/10/2017  . Hyperlipidemia LDL goal <100 03/25/2013  . Hypertension 03/25/2013  .  Hyperglycemia 11/01/2012    KIzell Glendive PT, DPT 12/11/2020, 4:49 PM  [facility] [address]Phone: [phone]  Fax:  [phone] Name: Summer UNREINMRN: [id number] of Birth: 102-Oct-1962

## 2020-12-13 ENCOUNTER — Ambulatory Visit: Payer: 59

## 2020-12-13 ENCOUNTER — Other Ambulatory Visit: Payer: Self-pay

## 2020-12-13 DIAGNOSIS — M545 Low back pain, unspecified: Secondary | ICD-10-CM | POA: Diagnosis not present

## 2020-12-13 DIAGNOSIS — M62830 Muscle spasm of back: Secondary | ICD-10-CM

## 2020-12-13 DIAGNOSIS — M2569 Stiffness of other specified joint, not elsewhere classified: Secondary | ICD-10-CM | POA: Diagnosis not present

## 2020-12-13 DIAGNOSIS — R262 Difficulty in walking, not elsewhere classified: Secondary | ICD-10-CM | POA: Diagnosis not present

## 2020-12-13 NOTE — Therapy (Signed)
[place], [place], [address] Phone: [phone]   Fax:  [phone]  Physical Therapy Treatment  Patient Details  Name: [name] MRN: [id number] Date of Birth: [birth year] Referring Provider (PT): Haydee Salter, MD   Encounter Date: 12/13/2020   PT End of Session - 12/13/20 1548    Visit Number 8    Number of Visits 13    Date for PT Re-Evaluation 01/05/21    Authorization Type [place] [employer]    Authorization Time Period re-assess FOTO on the 6th and 10th visits    Progress Note Due on Visit 10    PT Start Time 1548    PT Stop Time 1629    PT Time Calculation (min) 41 min    Activity Tolerance Patient tolerated treatment well    Behavior During Therapy [facility] for tasks assessed/performed           Past Medical History:  Diagnosis Date  . Diabetes mellitus without complication ([place])   . Endometriosis   . Fibroids    uterine  . GERD (gastroesophageal reflux disease)   . 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 History of hiatal hernia   . History of kidney stones 2014, 2017  . Hyperlipidemia   . Hypertension   . Neuromuscular disorder ([place]) ~2000   "exposed nerve" Lt flank  . Obesity (BMI 30-39.9)   . Peptic stricture of esophagus   . Uterine fibroid     Past Surgical History:  Procedure Laterality Date  . ABDOMINAL HYSTERECTOMY  2008  . BREAST CYST EXCISION  2007  . EXPLORATORY LAPAROTOMY  ~2005  . MASS EXCISION Left 07/11/2016   Procedure: EXCISION MASS LEFT UPPER ARM;  Surgeon: Armandina Gemma, MD;  Location: [place];  Service: General;  Laterality: Left;    There were no vitals filed for this visit.   Subjective Assessment - 12/13/20 1551    Subjective Not too bad today. It's letting me know it's there on the Lt side.    Limitations Lifting;Walking    How long can you sit comfortably? Not  an issue    How long can you stand comfortably? Not an issue    How long can you walk comfortably? 10 mins    Diagnostic tests 10/07/20: X-ray IMPRESSION:  1. Mild degenerative disc disease most prominent at L4-L5.  2. L5-S1 facet hypertrophy.  3. Enlarged left transverse process of L5 and pseudoarticulation  with the sacrum.    Currently in Pain? Yes    Pain Score 2     Pain Location Back    Pain Orientation Left;Posterior;Lower    Pain Descriptors / Indicators Dull    Pain Onset More than a month ago              [facility] PT Assessment - 12/13/20 0001      Palpation   Palpation comment Rt iliac crest and ASIS elevated                         [facility] Adult PT Treatment/Exercise - 12/13/20 0001      Lumbar Exercises: Stretches   Other Lumbar Stretch Exercise childs pose 60 seconds      Lumbar Exercises: Seated   Other Seated Lumbar Exercises hip hinge wiht dowel 2 x 10      Lumbar Exercises:  Supine   Bent Knee Raise 20 reps    Bridge 10 reps    Bridge Limitations x2; neutral spine      Lumbar Exercises: Quadruped   [place] 10 reps      Manual Therapy   Manual therapy comments muscle energy technique to improve pelvic symmetry    Joint Mobilization L lumbar gapping grade 3-4, thoracic PAs in prone grade 3-4    Soft tissue mobilization STM Lt lumbar parapsinals, QL                    PT Short Term Goals - 12/06/20 1703      PT SHORT TERM GOAL #1   Title Pt will be Ind in an initial HEP    Baseline verbalizes compliance with HEP    Status Achieved    Target Date 12/06/20      PT SHORT TERM GOAL #2   Title Pt will voice understanding of measures to reduce and manage low back pain.    Status Achieved    Target Date 12/06/20      PT SHORT TERM GOAL #3   Title Pt will demonstrate proper body mechanics for household and work related activities    Baseline tactile, visual, and verbal cues for core activation and postural control -  reviewed posture while at work    Status Partially Met    Target Date 12/06/20             PT Long Term Goals - 11/08/20 0812      PT LONG TERM GOAL #1   Title Pt will complete transfer, gait, and lifting activities without guarding and improved quality of movement    Status New    Target Date 01/05/21      PT LONG TERM GOAL #2   Title Pt will report an improvement in low back pain to 2/10 or less with daily activites of home and work    Baseline 4-5/10    Status New    Target Date 01/05/21      PT LONG TERM GOAL #3   Title Pt will demonstrate improved ROM of her back by 25% with all motions    Baseline see flowsheets    Status New    Target Date 01/05/21      PT LONG TERM GOAL #4   Title Pt's FOTO score will improve to the predicted value of 63% functional ability    Baseline 47% functional ability    Status New    Target Date 01/05/21                 Plan - 12/13/20 1611    Clinical Impression Statement Patient reports tenderness along Lt lumbar paraspinals and superior aspect of glute max with manual therapy, though most notable tenderness at Lt PSIS. Assessment of pelvic landmarks showed elevated Rt iliac crest and ASIS in standing that easily corrects muscle energy technique. Mechanics have improved with hip hinge with patient able to maintain neutral spine without cueing. Overall good tolerance to today's session without reports of pain.    Personal Factors and Comorbidities Comorbidity 3+;Comorbidity 2;Comorbidity 1    Comorbidities Low back degenerative changes for low back, DM, high BMI    Examination-Activity Limitations Locomotion Level;Bend;Carry;Lift    Examination-Participation Restrictions Occupation    Stability/Clinical Decision Making Stable/Uncomplicated    Rehab Potential Good    PT Frequency 2x / week    PT Duration 6 weeks  PT Treatment/Interventions ADLs/Self Care Home Management;Cryotherapy;Electrical Stimulation;Ultrasound;Traction;Moist  Heat;Iontophoresis 61m/ml Dexamethasone;Therapeutic activities;Therapeutic exercise;Manual techniques;Patient/family education;Dry needling;Taping;Spinal Manipulations    PT Next Visit Plan assess pelvic alignment. hip hinge in standing. Assess response to HEP and update PRN. Progress ther ex/core strengthening as tolerated. Utilize manual techniques and modalities as indicated. Initiate squatting, lifting, deadlifts as tolerated    PT Home Exercise Plan [id number]    Consulted and Agree with Plan of Care Patient           Patient will benefit from skilled therapeutic intervention in order to improve the following deficits and impairments:  Difficulty walking,Increased muscle spasms,Obesity,Decreased activity tolerance,Pain,Decreased mobility,Decreased range of motion,Decreased strength  Visit Diagnosis: Acute bilateral low back pain without sciatica  Muscle spasm of back  Decreased ROM of trunk and back  Difficulty in walking, not elsewhere classified     Problem List Patient Active Problem List   Diagnosis Date Noted  . Acute bilateral low back pain without sciatica 10/12/2020  . Type 2 diabetes mellitus without complication, without long-term current use of insulin ([place] 07/10/2017  . Left flank pain, chronic 07/10/2017  . Hiatal hernia with GERD without esophagitis 03/10/2017  . Neuropathic pain 03/10/2017  . Vitamin D deficiency 03/10/2017  . Hyperlipidemia LDL goal <100 03/25/2013  . Hypertension 03/25/2013  . Hyperglycemia 11/01/2012   SGwendolyn Grant PT, DPT, ATC 12/13/20 4:32 PM  [facility]GSublette [place] [address]Phone: [phone]  Fax:  [phone] Name: Summer CARISMRN: [id number] of Birth: 124-Dec-1962

## 2020-12-16 MED FILL — Sitagliptin Phosphate Tab 50 MG (Base Equiv): ORAL | 90 days supply | Qty: 90 | Fill #0 | Status: AC

## 2020-12-17 ENCOUNTER — Other Ambulatory Visit (HOSPITAL_COMMUNITY): Payer: Self-pay

## 2020-12-18 ENCOUNTER — Ambulatory Visit: Payer: 59 | Admitting: Physical Therapy

## 2020-12-18 ENCOUNTER — Other Ambulatory Visit: Payer: Self-pay

## 2020-12-18 ENCOUNTER — Encounter: Payer: Self-pay | Admitting: Physical Therapy

## 2020-12-18 DIAGNOSIS — R262 Difficulty in walking, not elsewhere classified: Secondary | ICD-10-CM

## 2020-12-18 DIAGNOSIS — M2569 Stiffness of other specified joint, not elsewhere classified: Secondary | ICD-10-CM

## 2020-12-18 DIAGNOSIS — M545 Low back pain, unspecified: Secondary | ICD-10-CM

## 2020-12-18 DIAGNOSIS — M62830 Muscle spasm of back: Secondary | ICD-10-CM

## 2020-12-19 NOTE — Patient Instructions (Signed)

## 2020-12-19 NOTE — Therapy (Signed)
[place], [place], [address] Phone: [phone]   Fax:  [phone]  Physical Therapy Treatment  Patient Details  Name: [name] MRN: [id number] Date of Birth: Jan 22, 1961 Referring Provider (PT): Haydee Salter, MD   Encounter Date: 12/18/2020   PT End of Session - 12/19/20 1050    Visit Number 9    Number of Visits 13    Date for PT Re-Evaluation 01/05/21    Authorization Type [place] [employer]    Authorization Time Period re-assess FOTO on the 6th and 10th visits    PT Start Time 1545    PT Stop Time 1630    PT Time Calculation (min) 45 min    Activity Tolerance Patient tolerated treatment well    Behavior During Therapy [facility] for tasks assessed/performed           Past Medical History:  Diagnosis Date  . Diabetes mellitus without complication ([place])   . Endometriosis   . Fibroids    uterine  . GERD (gastroesophageal reflux disease)   . 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 History of hiatal hernia   . History of kidney stones 2014, 2017  . Hyperlipidemia   . Hypertension   . Neuromuscular disorder ([place]) ~2000   "exposed nerve" Lt flank  . Obesity (BMI 30-39.9)   . Peptic stricture of esophagus   . Uterine fibroid     Past Surgical History:  Procedure Laterality Date  . ABDOMINAL HYSTERECTOMY  2008  . BREAST CYST EXCISION  2007  . EXPLORATORY LAPAROTOMY  ~2005  . MASS EXCISION Left 07/11/2016   Procedure: EXCISION MASS LEFT UPPER ARM;  Surgeon: Armandina Gemma, MD;  Location: [place];  Service: General;  Laterality: Left;    There were no vitals filed for this visit.   Subjective Assessment - 12/18/20 1549    Subjective 2/10 left lateral lumbar/proximal gluteal region pain this PM. Overall still getting better with pt. rating improvement at approximately 75% from baseline status.     Currently in Pain? Yes    Pain Score 2     Pain Location Back    Pain Orientation Left;Posterior;Lateral;Lower    Pain Descriptors / Indicators Dull    Pain Type Acute pain    Pain Onset More than a month ago    Pain Frequency Intermittent    Aggravating Factors  lifting    Pain Relieving Factors meds, heat/ice    Effect of Pain on Daily Activities impacts status at work                             [facility] Adult PT Treatment/Exercise - 12/19/20 0001      Lumbar Exercises: Stretches   Double Knee to Chest Stretch Limitations x 10 reps legs on 55 cm P-ball    Lower Trunk Rotation Limitations right LTR with legs on 55 cm P-ball x 10 reps    Other Lumbar Stretch Exercise sidelying manual left QL stretch 3x30 sec, supine manual left glut stretch 3x30 sec    Other Lumbar Stretch Exercise doorway left QL stretch 3x30 sec      Lumbar Exercises: Supine   Bent Knee Raise 20 reps    Bridge 10 reps    Bridge Limitations x2; neutral spine      Manual Therapy  Joint Mobilization LAD left hip grade I-III oscillations, AP mobs grade I-III to left ASIS    Soft tissue mobilization STM lefft lumbar paraspinals in prone            Trigger Point Dry Needling - 12/19/20 0001    Consent Given? Yes    Education Handout Provided Yes    Muscles Treated Back/Hip Quadratus lumborum;Erector spinae   L4-5 region for erector spinae   Dry Needling Comments needling to left lateral lumbar paraspinals/lumbar iliocostalis and QL with 30 gauge 50 and 75 mm needles                PT Education - 12/19/20 1049    Education Details dry needling, exercises    Person(s) Educated Patient    Methods Explanation;Demonstration;Verbal cues;Handout    Comprehension Verbalized understanding;Returned demonstration            PT Short Term Goals - 12/06/20 1703      PT SHORT TERM GOAL #1   Title Pt will be Ind in an initial HEP    Baseline verbalizes compliance with HEP    Status  Achieved    Target Date 12/06/20      PT SHORT TERM GOAL #2   Title Pt will voice understanding of measures to reduce and manage low back pain.    Status Achieved    Target Date 12/06/20      PT SHORT TERM GOAL #3   Title Pt will demonstrate proper body mechanics for household and work related activities    Baseline tactile, visual, and verbal cues for core activation and postural control - reviewed posture while at work    Status Partially Met    Target Date 12/06/20             PT Long Term Goals - 11/08/20 0812      PT LONG TERM GOAL #1   Title Pt will complete transfer, gait, and lifting activities without guarding and improved quality of movement    Status New    Target Date 01/05/21      PT LONG TERM GOAL #2   Title Pt will report an improvement in low back pain to 2/10 or less with daily activites of home and work    Baseline 4-5/10    Status New    Target Date 01/05/21      PT LONG TERM GOAL #3   Title Pt will demonstrate improved ROM of her back by 25% with all motions    Baseline see flowsheets    Status New    Target Date 01/05/21      PT LONG TERM GOAL #4   Title Pt's FOTO score will improve to the predicted value of 63% functional ability    Baseline 47% functional ability    Status New    Target Date 01/05/21                 Plan - 12/19/20 1050    Clinical Impression Statement Overall pt. continues to improve with decreased back pain from baseline status. Checked pelvic alignment with no innominate rotation noted but did perform AP mob to left ASIS as pt. was noted to have directional preference for posterior innominate motion. P.t still with local area of muscle pain/spasm left lateral lumbar region to added trial dry needling to address and will await further tx. response. Otherwise pt. continues to progress well with therapy goals.    Personal Factors and Comorbidities Comorbidity 3+;Comorbidity  2;Comorbidity 1    Comorbidities Low back  degenerative changes for low back, DM, high BMI    Examination-Activity Limitations Locomotion Level;Bend;Carry;Lift    Examination-Participation Restrictions Occupation    Stability/Clinical Decision Making Stable/Uncomplicated    Clinical Decision Making Low    Rehab Potential Good    PT Frequency 2x / week    PT Duration 6 weeks    PT Treatment/Interventions ADLs/Self Care Home Management;Cryotherapy;Electrical Stimulation;Ultrasound;Traction;Moist Heat;Iontophoresis 53m/ml Dexamethasone;Therapeutic activities;Therapeutic exercise;Manual techniques;Patient/family education;Dry needling;Taping;Spinal Manipulations    PT Next Visit Plan Recheck FOTO, did dry needling help?, continue exercise progression as tolerated, further manual/METs as needed    PT Home Exercise Plan [id number]    Consulted and Agree with Plan of Care Patient           Patient will benefit from skilled therapeutic intervention in order to improve the following deficits and impairments:  Difficulty walking,Increased muscle spasms,Obesity,Decreased activity tolerance,Pain,Decreased mobility,Decreased range of motion,Decreased strength  Visit Diagnosis: Acute bilateral low back pain without sciatica  Muscle spasm of back  Decreased ROM of trunk and back  Difficulty in walking, not elsewhere classified     Problem List Patient Active Problem List   Diagnosis Date Noted  . Acute bilateral low back pain without sciatica 10/12/2020  . Type 2 diabetes mellitus without complication, without long-term current use of insulin ([place] 07/10/2017  . Left flank pain, chronic 07/10/2017  . Hiatal hernia with GERD without esophagitis 03/10/2017  . Neuropathic pain 03/10/2017  . Vitamin D deficiency 03/10/2017  . Hyperlipidemia LDL goal <100 03/25/2013  . Hypertension 03/25/2013  . Hyperglycemia 11/01/2012    CBeaulah Dinning PT, DPT 12/19/20 10:54 AM  CThe Hand Center LLC13[address] James [address]GKoloa N[place] [address]Phone: [phone]  Fax:  [phone] Name: WCHERYLEE RAWLINSONMRN: [id number] of Birth: 1Apr 17, 1962

## 2020-12-20 ENCOUNTER — Other Ambulatory Visit: Payer: Self-pay

## 2020-12-20 ENCOUNTER — Ambulatory Visit: Payer: 59

## 2020-12-20 DIAGNOSIS — M2569 Stiffness of other specified joint, not elsewhere classified: Secondary | ICD-10-CM | POA: Diagnosis not present

## 2020-12-20 DIAGNOSIS — R262 Difficulty in walking, not elsewhere classified: Secondary | ICD-10-CM

## 2020-12-20 DIAGNOSIS — M545 Low back pain, unspecified: Secondary | ICD-10-CM | POA: Diagnosis not present

## 2020-12-20 DIAGNOSIS — M62830 Muscle spasm of back: Secondary | ICD-10-CM | POA: Diagnosis not present

## 2020-12-21 NOTE — Therapy (Signed)
[place], [place], [address] Phone: [phone]   Fax:  [phone]  Physical Therapy Treatment/Progress Note  Patient Details  Name: [name] MRN: [id number] Date of Birth: 1960/09/22 Referring Provider (PT): Haydee Salter, MD  Progress Note Reporting Period 11/07/20 to 12/20/20  See note below for Objective Data and Assessment of Progress/Goals.    Encounter Date: 12/20/2020   PT End of Session - 12/21/20 0921    Visit Number 10    Number of Visits 13    Date for PT Re-Evaluation 01/05/21    Authorization Type [place] [employer]    Authorization Time Period re-assess FOTO on the 6th and 10th visits    Progress Note Due on Visit 10    PT Start Time 1622    PT Stop Time 1705    PT Time Calculation (min) 43 min    Activity Tolerance Patient tolerated treatment well    Behavior During Therapy WFL for tasks assessed/performed           Past Medical History:  Diagnosis Date  . Diabetes mellitus without complication ([place])   . Endometriosis   . Fibroids    uterine  . GERD (gastroesophageal reflux disease)   . 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 History of hiatal hernia   . History of kidney stones 2014, 2017  . Hyperlipidemia   . Hypertension   . Neuromuscular disorder ([place]) ~2000   "exposed nerve" Lt flank  . Obesity (BMI 30-39.9)   . Peptic stricture of esophagus   . Uterine fibroid     Past Surgical History:  Procedure Laterality Date  . ABDOMINAL HYSTERECTOMY  2008  . BREAST CYST EXCISION  2007  . EXPLORATORY LAPAROTOMY  ~2005  . MASS EXCISION Left 07/11/2016   Procedure: EXCISION MASS LEFT UPPER ARM;  Surgeon: Armandina Gemma, MD;  Location: [facility];  Service: General;  Laterality: Left;    There were no vitals filed for this visit.   Subjective Assessment - 12/20/20 1629    Subjective  Overall the pain is less, but stool have 2/10 level of pain for the L lumbar/gluteal area. Pt reports massage seems to be the most helpful for lessening her pain.    Currently in Pain? Yes    Pain Score 2     Pain Location Back    Pain Orientation Left;Posterior;Lower;Lateral    Pain Descriptors / Indicators Dull    Pain Onset More than a month ago    Pain Frequency Intermittent                             [facility] Adult PT Treatment/Exercise - 12/21/20 0001      Lumbar Exercises: Stretches   Double Knee to Chest Stretch Limitations x 10 reps legs on 55 cm P-ball    Lower Trunk Rotation Limitations right LTR with legs on 55 cm P-ball x 10 reps    Other Lumbar Stretch Exercise sidelying manual left QL stretch 3x30 sec, supine manual left glut stretch 3x30 sec; forward flexion c Tball forward and to the r lateral 10x each; 5 sce    Other Lumbar Stretch Exercise doorway left QL stretch 3x30 sec      Manual Therapy   Soft tissue mobilization STM left lumbar paraspinals, QL, and upper gluteal muscle  PT Short Term Goals - 12/06/20 1703      PT SHORT TERM GOAL #1   Title Pt will be Ind in an initial HEP    Baseline verbalizes compliance with HEP    Status Achieved    Target Date 12/06/20      PT SHORT TERM GOAL #2   Title Pt will voice understanding of measures to reduce and manage low back pain.    Status Achieved    Target Date 12/06/20      PT SHORT TERM GOAL #3   Title Pt will demonstrate proper body mechanics for household and work related activities    Baseline tactile, visual, and verbal cues for core activation and postural control - reviewed posture while at work    Status Partially Met    Target Date 12/06/20             PT Long Term Goals - 11/08/20 0812      PT LONG TERM GOAL #1   Title Pt will complete transfer, gait, and lifting activities without guarding and improved quality of movement    Status New    Target  Date 01/05/21      PT LONG TERM GOAL #2   Title Pt will report an improvement in low back pain to 2/10 or less with daily activites of home and work    Baseline 4-5/10    Status New    Target Date 01/05/21      PT LONG TERM GOAL #3   Title Pt will demonstrate improved ROM of her back by 25% with all motions    Baseline see flowsheets    Status New    Target Date 01/05/21      PT LONG TERM GOAL #4   Title Pt's FOTO score will improve to the predicted value of 63% functional ability    Baseline 47% functional ability    Status New    Target Date 01/05/21                 Plan - 12/21/20 0936    Clinical Impression Statement Pt continues to experience a low level pain of L low back pain, overall, she is doing better c pain and activity tolerance. PT was completed for L lumbar and gluteal stretches f/b STM to the area. Pt reports feeling better following the session.    Personal Factors and Comorbidities Comorbidity 3+;Comorbidity 2;Comorbidity 1    Comorbidities Low back degenerative changes for low back, DM, high BMI    Examination-Activity Limitations Locomotion Level;Bend;Carry;Lift    Examination-Participation Restrictions Occupation    Stability/Clinical Decision Making Stable/Uncomplicated    Clinical Decision Making Low    Rehab Potential Good    PT Frequency 2x / week    PT Duration 6 weeks    PT Treatment/Interventions ADLs/Self Care Home Management;Cryotherapy;Electrical Stimulation;Ultrasound;Traction;Moist Heat;Iontophoresis 73m/ml Dexamethasone;Therapeutic activities;Therapeutic exercise;Manual techniques;Patient/family education;Dry needling;Taping;Spinal Manipulations    PT Next Visit Plan Recheck FOTO, did dry needling help?, continue exercise progression as tolerated, further manual/METs as needed    PT Home Exercise Plan [email or web address]    Consulted and Agree with Plan of Care Patient           Patient will benefit from skilled therapeutic intervention in order  to improve the following deficits and impairments:  Difficulty walking,Increased muscle spasms,Obesity,Decreased activity tolerance,Pain,Decreased mobility,Decreased range of motion,Decreased strength  Visit Diagnosis: Acute bilateral low back pain without sciatica  Muscle spasm of back  Decreased ROM of trunk and  back  Difficulty in walking, not elsewhere classified     Problem List Patient Active Problem List   Diagnosis Date Noted  . Acute bilateral low back pain without sciatica 10/12/2020  . Type 2 diabetes mellitus without complication, without long-term current use of insulin ([place]) 07/10/2017  . Left flank pain, chronic 07/10/2017  . Hiatal hernia with GERD without esophagitis 03/10/2017  . Neuropathic pain 03/10/2017  . Vitamin D deficiency 03/10/2017  . Hyperlipidemia LDL goal <100 03/25/2013  . Hypertension 03/25/2013  . Hyperglycemia 11/01/2012    Gar Ponto MS, PT 12/21/20 9:53 AM  [facility] [address] [place], [place], [address] Phone: [phone]   Fax:  [phone]  Name: [name] MRN: [id number] Date of Birth: [birth year]

## 2020-12-25 ENCOUNTER — Ambulatory Visit: Payer: 59

## 2020-12-25 ENCOUNTER — Other Ambulatory Visit: Payer: Self-pay

## 2020-12-25 DIAGNOSIS — R262 Difficulty in walking, not elsewhere classified: Secondary | ICD-10-CM | POA: Diagnosis not present

## 2020-12-25 DIAGNOSIS — M2569 Stiffness of other specified joint, not elsewhere classified: Secondary | ICD-10-CM

## 2020-12-25 DIAGNOSIS — M545 Low back pain, unspecified: Secondary | ICD-10-CM | POA: Diagnosis not present

## 2020-12-25 DIAGNOSIS — M62830 Muscle spasm of back: Secondary | ICD-10-CM

## 2020-12-26 NOTE — Therapy (Signed)
[place], [place], [address] Phone: [phone]   Fax:  [phone]  Physical Therapy Treatment  Patient Details  Name: [name] MRN: [id number] Date of Birth: Aug 26, 1961 Referring Provider (PT): Haydee Salter, MD   Encounter Date: 12/25/2020   PT End of Session - 12/25/20 1627    Visit Number 11    Number of Visits 13    Date for PT Re-Evaluation 01/05/21    Authorization Type [place] [employer]    Authorization Time Period re-assess FOTO on the 6th and 10th visits    PT Start Time 1625    PT Stop Time 1705    PT Time Calculation (min) 40 min    Activity Tolerance Patient tolerated treatment well    Behavior During Therapy [facility] for tasks assessed/performed           Past Medical History:  Diagnosis Date  . Diabetes mellitus without complication ([place])   . Endometriosis   . Fibroids    uterine  . GERD (gastroesophageal reflux disease)   . 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 History of hiatal hernia   . History of kidney stones 2014, 2017  . Hyperlipidemia   . Hypertension   . Neuromuscular disorder ([place]) ~2000   "exposed nerve" Lt flank  . Obesity (BMI 30-39.9)   . Peptic stricture of esophagus   . Uterine fibroid     Past Surgical History:  Procedure Laterality Date  . ABDOMINAL HYSTERECTOMY  2008  . BREAST CYST EXCISION  2007  . EXPLORATORY LAPAROTOMY  ~2005  . MASS EXCISION Left 07/11/2016   Procedure: EXCISION MASS LEFT UPPER ARM;  Surgeon: Armandina Gemma, MD;  Location: [facility];  Service: General;  Laterality: Left;    There were no vitals filed for this visit.   Subjective Assessment - 12/25/20 1624    Subjective Pt reports being sore immediately after the last session, but by that evening and since her L low back pain has been 1/10    Limitations Lifting;Walking    How long can you  sit comfortably? Not an issue    How long can you stand comfortably? Not an issue    How long can you walk comfortably? 10 mins    Diagnostic tests 10/07/20: X-ray IMPRESSION:  1. Mild degenerative disc disease most prominent at L4-L5.  2. L5-S1 facet hypertrophy.  3. Enlarged left transverse process of L5 and pseudoarticulation  with the sacrum.    Currently in Pain? Yes    Pain Score 1     Pain Location Back    Pain Orientation Right;Posterior;Lower    Pain Descriptors / Indicators Dull    Pain Type Acute pain    Pain Onset More than a month ago    Pain Frequency Intermittent                             [facility] Adult PT Treatment/Exercise - 12/26/20 0001      Lumbar Exercises: Stretches   Double Knee to Chest Stretch Limitations x 10 reps legs on 55 cm P-ball    Lower Trunk Rotation Limitations right LTR with legs on 55 cm P-ball x 10 reps    Piriformis Stretch Left;2 reps;20 seconds    Other Lumbar Stretch Exercise sidelying manual left QL stretch 3x30 sec,  supine manual left glut stretch 3x30 sec; forward flexion c Tball forward and to the R lateral 10x each; 5 sec    Other Lumbar Stretch Exercise doorway left QL stretch 3x30 sec      Manual Therapy   Joint Mobilization R UPA along lumbar L1-L5 TPs to facilitate L lumbar ROT    Soft tissue mobilization STM left lumbar paraspinals, QL, and upper gluteal muscle                  PT Education - 12/25/20 1830    Education Details Tennis ball for self massage to the L low back and upper gluteal area.    Person(s) Educated Patient    Methods Explanation;Demonstration;Verbal cues    Comprehension Returned demonstration;Verbalized understanding;Verbal cues required            PT Short Term Goals - 12/06/20 1703      PT SHORT TERM GOAL #1   Title Pt will be Ind in an initial HEP    Baseline verbalizes compliance with HEP    Status Achieved    Target Date 12/06/20      PT SHORT TERM GOAL #2   Title Pt  will voice understanding of measures to reduce and manage low back pain.    Status Achieved    Target Date 12/06/20      PT SHORT TERM GOAL #3   Title Pt will demonstrate proper body mechanics for household and work related activities    Baseline tactile, visual, and verbal cues for core activation and postural control - reviewed posture while at work    Status Partially Met    Target Date 12/06/20             PT Long Term Goals - 11/08/20 0812      PT LONG TERM GOAL #1   Title Pt will complete transfer, gait, and lifting activities without guarding and improved quality of movement    Status New    Target Date 01/05/21      PT LONG TERM GOAL #2   Title Pt will report an improvement in low back pain to 2/10 or less with daily activites of home and work    Baseline 4-5/10    Status New    Target Date 01/05/21      PT LONG TERM GOAL #3   Title Pt will demonstrate improved ROM of her back by 25% with all motions    Baseline see flowsheets    Status New    Target Date 01/05/21      PT LONG TERM GOAL #4   Title Pt's FOTO score will improve to the predicted value of 63% functional ability    Baseline 47% functional ability    Status New    Target Date 01/05/21                 Plan - 12/25/20 1630    Clinical Impression Statement PT was completed for L low back flexibility and to address increased muscle tension of the L low back. Pt was provided a tennis ball for her to complete self massage to the area. Pt's subjective report indicates she is improving with a small additional decreased in her L low back.    Personal Factors and Comorbidities Comorbidity 3+;Comorbidity 2;Comorbidity 1    Comorbidities Low back degenerative changes for low back, DM, high BMI    Examination-Activity Limitations Locomotion Level;Bend;Carry;Lift    Examination-Participation Restrictions Occupation    Stability/Clinical  Decision Making Stable/Uncomplicated    Clinical Decision Making Low     Rehab Potential Good    PT Frequency 2x / week    PT Duration 6 weeks    PT Treatment/Interventions ADLs/Self Care Home Management;Cryotherapy;Electrical Stimulation;Ultrasound;Traction;Moist Heat;Iontophoresis 59m/ml Dexamethasone;Therapeutic activities;Therapeutic exercise;Manual techniques;Patient/family education;Dry needling;Taping;Spinal Manipulations    PT Next Visit Plan Recheck FOTO, did dry needling help?, continue exercise progression as tolerated, further manual/METs as needed    PT Home Exercise Plan [email or web address]    Consulted and Agree with Plan of Care Patient           Patient will benefit from skilled therapeutic intervention in order to improve the following deficits and impairments:  Difficulty walking,Increased muscle spasms,Obesity,Decreased activity tolerance,Pain,Decreased mobility,Decreased range of motion,Decreased strength  Visit Diagnosis: Acute bilateral low back pain without sciatica  Muscle spasm of back  Decreased ROM of trunk and back  Difficulty in walking, not elsewhere classified     Problem List Patient Active Problem List   Diagnosis Date Noted  . Acute bilateral low back pain without sciatica 10/12/2020  . Type 2 diabetes mellitus without complication, without long-term current use of insulin ([place] 07/10/2017  . Left flank pain, chronic 07/10/2017  . Hiatal hernia with GERD without esophagitis 03/10/2017  . Neuropathic pain 03/10/2017  . Vitamin D deficiency 03/10/2017  . Hyperlipidemia LDL goal <100 03/25/2013  . Hypertension 03/25/2013  . Hyperglycemia 11/01/2012   AGar PontoMS, PT 12/26/20 8:03 AM  [facility] [address]Phone: [phone]  Fax:  [phone] Name: Summer SUDOLMRN: [id number] of Birth: 101/31/1962

## 2020-12-27 ENCOUNTER — Ambulatory Visit: Payer: 59

## 2020-12-27 ENCOUNTER — Other Ambulatory Visit: Payer: Self-pay

## 2020-12-27 DIAGNOSIS — M545 Low back pain, unspecified: Secondary | ICD-10-CM | POA: Diagnosis not present

## 2020-12-27 DIAGNOSIS — M2569 Stiffness of other specified joint, not elsewhere classified: Secondary | ICD-10-CM | POA: Diagnosis not present

## 2020-12-27 DIAGNOSIS — M62830 Muscle spasm of back: Secondary | ICD-10-CM | POA: Diagnosis not present

## 2020-12-27 DIAGNOSIS — R262 Difficulty in walking, not elsewhere classified: Secondary | ICD-10-CM

## 2020-12-27 NOTE — Therapy (Signed)
[place], [place], [address] Phone: [phone]   Fax:  [phone]  Physical Therapy Treatment/Discharge  Patient Details  Name: [name] MRN: [id number] Date of Birth: 09/25/1960 Referring Provider (PT): Haydee Salter, MD   Encounter Date: 12/27/2020   PT End of Session - 12/27/20 1839    Visit Number 12    Number of Visits 13    Date for PT Re-Evaluation 01/05/21    Authorization Type [place] [employer]    PT Start Time 1618    PT Stop Time 1702    PT Time Calculation (min) 44 min    Activity Tolerance Patient tolerated treatment well    Behavior During Therapy [facility] for tasks assessed/performed           Past Medical History:  Diagnosis Date  . Diabetes mellitus without complication ([place])   . Endometriosis   . Fibroids    uterine  . GERD (gastroesophageal reflux disease)   . 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 History of hiatal hernia   . History of kidney stones 2014, 2017  . Hyperlipidemia   . Hypertension   . Neuromuscular disorder ([place]) ~2000   "exposed nerve" Lt flank  . Obesity (BMI 30-39.9)   . Peptic stricture of esophagus   . Uterine fibroid     Past Surgical History:  Procedure Laterality Date  . ABDOMINAL HYSTERECTOMY  2008  . BREAST CYST EXCISION  2007  . EXPLORATORY LAPAROTOMY  ~2005  . MASS EXCISION Left 07/11/2016   Procedure: EXCISION MASS LEFT UPPER ARM;  Surgeon: Armandina Gemma, MD;  Location: [facility];  Service: General;  Laterality: Left;    There were no vitals filed for this visit.   Subjective Assessment - 12/27/20 1622    Subjective Pt reports the tennis ball has been helpful. Pt reports her L low back has lingered at a 1 to 2/10.    Diagnostic tests 10/07/20: X-ray IMPRESSION:  1. Mild degenerative disc disease most prominent at L4-L5.  2. L5-S1 facet hypertrophy.   3. Enlarged left transverse process of L5 and pseudoarticulation  with the sacrum.    Currently in Pain? Yes    Pain Score 2     Pain Location Back    Pain Orientation Left;Posterior;Lower    Pain Descriptors / Indicators Dull    Pain Type Chronic pain    Pain Onset More than a month ago    Pain Frequency Intermittent    Aggravating Factors  lifting    Pain Relieving Factors exs, meds, heat/ice              [facility] PT Assessment - 12/27/20 0001      Observation/Other Assessments   Focus on Therapeutic Outcomes (FOTO)  57% ability      AROM   Lumbar Flexion fingertips at level of malleoli; WFLs    Lumbar Extension WFL but painful in left low back    Lumbar - Right Side Bend fingertips to inferior patellar pole; WFLs    Lumbar - Left Side Bend fingertips to inferior patellar pole; WFLs    Lumbar - Right Rotation [facility]    Lumbar - Left Rotation [facility]                         [facility] Adult PT Treatment/Exercise - 12/27/20 0001  Lumbar Exercises: Stretches   Single Knee to Chest Stretch Right;Left;2 reps;20 seconds    Double Knee to Chest Stretch Limitations x 10 reps legs on 55 cm P-ball    Lower Trunk Rotation Limitations right LTR with legs on 55 cm P-ball x 10 reps    Piriformis Stretch Left;2 reps;20 seconds    Other Lumbar Stretch Exercise sidelying manual left QL stretch 3x30 sec, supine manual left glut stretch 3x30 sec; forward flexion c Tball forward and to the R lateral 10x each; 5 sec      Lumbar Exercises: Supine   Pelvic Tilt 10 reps   3 sec   Bent Knee Raise 10 reps   2 sets   Dead Bug Limitations Abdominal bracing 3x; 10 sec                  PT Education - 12/27/20 1839    Education Details Final HEP    Person(s) Educated Patient    Methods Explanation    Comprehension Verbalized understanding            PT Short Term Goals - 12/06/20 1703      PT SHORT TERM GOAL #1   Title Pt will be Ind in an initial HEP    Baseline  verbalizes compliance with HEP    Status Achieved    Target Date 12/06/20      PT SHORT TERM GOAL #2   Title Pt will voice understanding of measures to reduce and manage low back pain.    Status Achieved    Target Date 12/06/20      PT SHORT TERM GOAL #3   Title Pt will demonstrate proper body mechanics for household and work related activities    Baseline tactile, visual, and verbal cues for core activation and postural control - reviewed posture while at work    Status Partially Met    Target Date 12/06/20             PT Long Term Goals - 12/27/20 1623      PT LONG TERM GOAL #1   Title Pt will complete transfer, gait, and lifting activities without guarding and improved quality of movement.Avoiding. 4/21: Pt''s quality of movement with sit t/f standing, sit t/f supine and rolling is much improved. Pt's states she is aware of movements and thinks about proper teechnique to avoid an issue with her back.    Status Partially Met    Target Date 12/27/20      PT LONG TERM GOAL #2   Title Pt will report an improvement in low back pain to 2/10 or less with daily activites of home and work. 4/21: Met    Status Achieved    Target Date 12/27/20      PT LONG TERM GOAL #3   Title Pt will demonstrate improved ROM of her back by 25% with all motions. 4/21: Met    Status Achieved    Target Date 12/27/20      PT LONG TERM GOAL #4   Title Pt's FOTO score will improve to the predicted value of 63% functional ability. 4/21: Not met at 57% ability    Baseline 47% functional ability    Status Not Met                 Plan - 12/27/20 1840    Clinical Impression Statement Pt has made good progress in PT with a decrease in pain and improved function. Pt's pain has decrease  to 1-2/10 intermittently with pt reporting the pain is continuing to decrease in small measures. Overall, pt estimates 75-80% improvement. Pt is Ind in a HEP. Discussed with pt options for course of care, and she wanted  to try continuing with the HEP on her own to see if she will improve further. Advised pt. if she is not progressing as she wants, then PT may be re-started with a new referral. Pt is pleased with her progress and is DCed with PT goals partially met    Personal Factors and Comorbidities Comorbidity 3+;Comorbidity 2;Comorbidity 1    Comorbidities Low back degenerative changes for low back, DM, high BMI    Examination-Activity Limitations Locomotion Level;Bend;Carry;Lift    Examination-Participation Restrictions Occupation    Stability/Clinical Decision Making Stable/Uncomplicated    Clinical Decision Making Low    PT Frequency --    PT Duration --    PT Treatment/Interventions ADLs/Self Care Home Management;Cryotherapy;Electrical Stimulation;Ultrasound;Traction;Moist Heat;Iontophoresis 4m/ml Dexamethasone;Therapeutic activities;Therapeutic exercise;Manual techniques;Patient/family education;Dry needling;Taping;Spinal Manipulations    PT Home Exercise Plan [email or web address]    Consulted and Agree with Plan of Care Patient           Patient will benefit from skilled therapeutic intervention in order to improve the following deficits and impairments:  Difficulty walking,Increased muscle spasms,Obesity,Decreased activity tolerance,Pain,Decreased mobility,Decreased range of motion,Decreased strength  Visit Diagnosis: Acute bilateral low back pain without sciatica  Muscle spasm of back  Decreased ROM of trunk and back  Difficulty in walking, not elsewhere classified     Problem List Patient Active Problem List   Diagnosis Date Noted  . Acute bilateral low back pain without sciatica 10/12/2020  . Type 2 diabetes mellitus without complication, without long-term current use of insulin ([place] 07/10/2017  . Left flank pain, chronic 07/10/2017  . Hiatal hernia with GERD without esophagitis 03/10/2017  . Neuropathic pain 03/10/2017  . Vitamin D deficiency 03/10/2017  . Hyperlipidemia LDL goal <100  03/25/2013  . Hypertension 03/25/2013  . Hyperglycemia 11/01/2012   PHYSICAL THERAPY DISCHARGE SUMMARY  Visits from Start of Care: 12  Current functional level related to goals / functional outcomes: See above   Remaining deficits: See above   Education / Equipment: HEP  Plan: Patient agrees to discharge.  Patient goals were not met. Patient is being discharged due to being pleased with the current functional level.  ?????       AGar PontoMS, PT 12/27/20 7:04 PM  [facility]GC[address] N[place] [address]Phone: [phone]  Fax:  [phone] Name: WTONDALAYA PERRENMRN: [id number] of Birth: 105-Sep-1962

## 2021-01-02 ENCOUNTER — Other Ambulatory Visit (HOSPITAL_COMMUNITY): Payer: Self-pay

## 2021-01-02 ENCOUNTER — Other Ambulatory Visit: Payer: Self-pay | Admitting: Family Medicine

## 2021-01-02 DIAGNOSIS — E119 Type 2 diabetes mellitus without complications: Secondary | ICD-10-CM

## 2021-01-02 MED ORDER — METFORMIN HCL ER 500 MG PO TB24
2000.0000 mg | ORAL_TABLET | Freq: Every day | ORAL | 3 refills | Status: DC
  Filled 2021-01-02: qty 360, 90d supply, fill #0
  Filled 2021-04-10: qty 360, 90d supply, fill #1

## 2021-01-02 MED FILL — Dapagliflozin Propanediol Tab 10 MG (Base Equivalent): ORAL | 90 days supply | Qty: 90 | Fill #0 | Status: AC

## 2021-01-02 MED FILL — Esomeprazole Magnesium Cap Delayed Release 40 MG (Base Eq): ORAL | 90 days supply | Qty: 90 | Fill #0 | Status: AC

## 2021-01-02 MED FILL — Imipramine HCl Tab 50 MG: ORAL | 90 days supply | Qty: 90 | Fill #0 | Status: AC

## 2021-01-02 MED FILL — Amlodipine Besylate-Olmesartan Medoxomil Tab 10-40 MG: ORAL | 90 days supply | Qty: 90 | Fill #0 | Status: AC

## 2021-01-02 MED FILL — Atorvastatin Calcium Tab 40 MG (Base Equivalent): ORAL | 90 days supply | Qty: 90 | Fill #0 | Status: AC

## 2021-02-15 ENCOUNTER — Other Ambulatory Visit (HOSPITAL_COMMUNITY): Payer: Self-pay

## 2021-02-15 MED ORDER — CARESTART COVID-19 HOME TEST VI KIT
PACK | 0 refills | Status: DC
  Filled 2021-02-15: qty 4, 4d supply, fill #0

## 2021-03-27 ENCOUNTER — Ambulatory Visit: Payer: 59 | Admitting: Podiatry

## 2021-03-27 ENCOUNTER — Ambulatory Visit (INDEPENDENT_AMBULATORY_CARE_PROVIDER_SITE_OTHER): Payer: 59

## 2021-03-27 ENCOUNTER — Other Ambulatory Visit (HOSPITAL_COMMUNITY): Payer: Self-pay

## 2021-03-27 ENCOUNTER — Other Ambulatory Visit: Payer: Self-pay

## 2021-03-27 DIAGNOSIS — M7672 Peroneal tendinitis, left leg: Secondary | ICD-10-CM

## 2021-03-27 MED ORDER — MELOXICAM 15 MG PO TABS
15.0000 mg | ORAL_TABLET | Freq: Every day | ORAL | 1 refills | Status: DC
  Filled 2021-03-27: qty 30, 30d supply, fill #0

## 2021-03-27 NOTE — Progress Notes (Signed)
   HPI: 60 y.o. female presenting today for new complaint regarding left ankle pain that the patient sustained on 03/18/2021.  Patient states that she stepped off of a curb at [place] and rolled her foot.  Ever since the injury she has had tight pressure feeling to the lateral aspect of the left ankle.  She has been wearing a compression ankle brace with minimal improvement.  She presents for further treatment and evaluation  Past Medical History:  Diagnosis Date   Diabetes mellitus without complication ([place])    Endometriosis    Fibroids    uterine   GERD (gastroesophageal reflux disease)    Hepatitis C antibody test positive 11/2015   undetectable HCV RNA so cured and immune, liver nml on CT 06/2016   History of hiatal hernia    History of kidney stones 2014, 2017   Hyperlipidemia    Hypertension    Neuromuscular disorder ([place]) ~2000   "exposed nerve" Lt flank   Obesity (BMI 30-39.9)    Peptic stricture of esophagus    Uterine fibroid      Physical Exam: General: The patient is alert and oriented x3 in no acute distress.  Dermatology: Skin is warm, dry and supple bilateral lower extremities. Negative for open lesions or macerations.  Vascular: Palpable pedal pulses bilaterally. No edema or erythema noted. Capillary refill within normal limits.  Neurological: Epicritic and protective threshold grossly intact bilaterally.   Musculoskeletal Exam: Range of motion within normal limits to all pedal and ankle joints bilateral. Muscle strength 5/5 in all groups bilateral.  There is some pain on palpation along the peroneal tendon sheath left just posterior to the lateral malleolus  Radiographic Exam:  Normal osseous mineralization. Joint spaces preserved. No fracture/dislocation/boney destruction.    Assessment: 1.  Peroneal tendinitis left secondary to ankle sprain 2.  Ankle sprain left.  DOI: 03/18/2021   Plan of Care:  1. Patient evaluated. X-Rays reviewed.  2.  Cam boot  dispensed.  Weightbearing as tolerated x3 weeks 3.  Compression ankle sleeve dispensed.  Wear daily 4.  Prescription for meloxicam 15 mg daily 5.  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   Edrick Kins, DPM [facility]  Dr. Edrick Kins, DPM    [address], [place] [address]                Office [phone]  Fax [phone]

## 2021-04-10 MED FILL — Atorvastatin Calcium Tab 40 MG (Base Equivalent): ORAL | 90 days supply | Qty: 90 | Fill #1 | Status: AC

## 2021-04-10 MED FILL — Imipramine HCl Tab 50 MG: ORAL | 90 days supply | Qty: 90 | Fill #1 | Status: AC

## 2021-04-10 MED FILL — Dapagliflozin Propanediol Tab 10 MG (Base Equivalent): ORAL | 90 days supply | Qty: 90 | Fill #1 | Status: AC

## 2021-04-10 MED FILL — Amlodipine Besylate-Olmesartan Medoxomil Tab 10-40 MG: ORAL | 90 days supply | Qty: 90 | Fill #1 | Status: AC

## 2021-04-10 MED FILL — Sitagliptin Phosphate Tab 50 MG (Base Equiv): ORAL | 30 days supply | Qty: 30 | Fill #1 | Status: AC

## 2021-04-10 MED FILL — Esomeprazole Magnesium Cap Delayed Release 40 MG (Base Eq): ORAL | 90 days supply | Qty: 90 | Fill #1 | Status: AC

## 2021-04-11 ENCOUNTER — Other Ambulatory Visit (HOSPITAL_COMMUNITY): Payer: Self-pay

## 2021-04-24 ENCOUNTER — Ambulatory Visit: Payer: 59 | Admitting: Podiatry

## 2021-06-13 ENCOUNTER — Other Ambulatory Visit (HOSPITAL_COMMUNITY): Payer: Self-pay

## 2021-06-13 MED FILL — Sitagliptin Phosphate Tab 50 MG (Base Equiv): ORAL | 30 days supply | Qty: 30 | Fill #2 | Status: AC

## 2021-09-17 ENCOUNTER — Other Ambulatory Visit (HOSPITAL_COMMUNITY): Payer: Self-pay

## 2021-11-19 ENCOUNTER — Encounter: Payer: 59 | Admitting: Family Medicine

## 2021-12-19 ENCOUNTER — Encounter: Payer: Self-pay | Admitting: Family Medicine

## 2021-12-19 ENCOUNTER — Ambulatory Visit: Payer: 59 | Admitting: Family Medicine

## 2021-12-19 ENCOUNTER — Other Ambulatory Visit (HOSPITAL_COMMUNITY): Payer: Self-pay

## 2021-12-19 VITALS — BP 128/76 | HR 110 | Temp 97.5°F | Ht 63.0 in | Wt 184.8 lb

## 2021-12-19 DIAGNOSIS — K219 Gastro-esophageal reflux disease without esophagitis: Secondary | ICD-10-CM | POA: Diagnosis not present

## 2021-12-19 DIAGNOSIS — E785 Hyperlipidemia, unspecified: Secondary | ICD-10-CM | POA: Diagnosis not present

## 2021-12-19 DIAGNOSIS — M792 Neuralgia and neuritis, unspecified: Secondary | ICD-10-CM | POA: Diagnosis not present

## 2021-12-19 DIAGNOSIS — Z6832 Body mass index (BMI) 32.0-32.9, adult: Secondary | ICD-10-CM

## 2021-12-19 DIAGNOSIS — E6609 Other obesity due to excess calories: Secondary | ICD-10-CM | POA: Diagnosis not present

## 2021-12-19 DIAGNOSIS — Z1211 Encounter for screening for malignant neoplasm of colon: Secondary | ICD-10-CM | POA: Diagnosis not present

## 2021-12-19 DIAGNOSIS — E1165 Type 2 diabetes mellitus with hyperglycemia: Secondary | ICD-10-CM | POA: Diagnosis not present

## 2021-12-19 DIAGNOSIS — I10 Essential (primary) hypertension: Secondary | ICD-10-CM | POA: Diagnosis not present

## 2021-12-19 DIAGNOSIS — Z0000 Encounter for general adult medical examination without abnormal findings: Secondary | ICD-10-CM | POA: Diagnosis not present

## 2021-12-19 LAB — COMPREHENSIVE METABOLIC PANEL
ALT: 23 U/L (ref 0–35)
AST: 17 U/L (ref 0–37)
Albumin: 4.2 g/dL (ref 3.5–5.2)
Alkaline Phosphatase: 108 U/L (ref 39–117)
BUN: 7 mg/dL (ref 6–23)
CO2: 27 mEq/L (ref 19–32)
Calcium: 9.1 mg/dL (ref 8.4–10.5)
Chloride: 100 mEq/L (ref 96–112)
Creatinine, Ser: 0.51 mg/dL (ref 0.40–1.20)
GFR: 101.58 mL/min (ref >60.00)
Glucose, Bld: 167 mg/dL — ABNORMAL HIGH (ref 70–99)
Potassium: 3.5 mEq/L (ref 3.5–5.1)
Sodium: 136 mEq/L (ref 135–145)
Total Bilirubin: 0.7 mg/dL (ref 0.2–1.2)
Total Protein: 7.8 g/dL (ref 6.0–8.3)

## 2021-12-19 LAB — CBC
HCT: 37.1 % (ref 36.0–46.0)
Hemoglobin: 12 g/dL (ref 12.0–15.0)
MCHC: 32.4 g/dL (ref 30.0–36.0)
MCV: 79.4 fl (ref 78.0–100.0)
Platelets: 338 10*3/uL (ref 150.0–400.0)
RBC: 4.67 Mil/uL (ref 3.87–5.11)
RDW: 14.5 % (ref 11.5–15.5)
WBC: 7.2 10*3/uL (ref 4.0–10.5)

## 2021-12-19 LAB — LIPID PANEL
Cholesterol: 168 mg/dL (ref 0–200)
HDL: 42.7 mg/dL (ref >39.00)
LDL Cholesterol: 96 mg/dL (ref 0–99)
NonHDL: 125.77
Total CHOL/HDL Ratio: 4
Triglycerides: 148 mg/dL (ref 0.0–149.0)
VLDL: 29.6 mg/dL (ref 0.0–40.0)

## 2021-12-19 LAB — URINALYSIS
Bilirubin Urine: NEGATIVE
Hgb urine dipstick: NEGATIVE
Ketones, ur: NEGATIVE
Leukocytes,Ua: NEGATIVE
Nitrite: NEGATIVE
Specific Gravity, Urine: 1.01 (ref 1.000–1.030)
Total Protein, Urine: NEGATIVE
Urine Glucose: >=1000 — AB
Urobilinogen, UA: 0.2 (ref 0.0–1.0)
pH: 7 (ref 5.0–8.0)

## 2021-12-19 LAB — HEMOGLOBIN A1C: Hgb A1c MFr Bld: 10 % — ABNORMAL HIGH (ref 4.6–6.5)

## 2021-12-19 LAB — MICROALBUMIN / CREATININE URINE RATIO
Creatinine,U: 61.5 mg/dL
Microalb Creat Ratio: 11.2 mg/g (ref 0.0–30.0)
Microalb, Ur: 6.9 mg/dL — ABNORMAL HIGH (ref 0.0–1.9)

## 2021-12-19 MED ORDER — ESOMEPRAZOLE MAGNESIUM 40 MG PO CPDR
40.0000 mg | DELAYED_RELEASE_CAPSULE | Freq: Every evening | ORAL | 3 refills | Status: DC
  Filled 2021-12-19: qty 90, 90d supply, fill #0
  Filled 2022-03-12: qty 90, 90d supply, fill #1
  Filled 2022-06-17: qty 90, 90d supply, fill #2
  Filled 2022-11-06: qty 90, 90d supply, fill #3

## 2021-12-19 MED ORDER — SEMAGLUTIDE(0.25 OR 0.5MG/DOS) 2 MG/1.5ML ~~LOC~~ SOPN
0.5000 mg | PEN_INJECTOR | SUBCUTANEOUS | 3 refills | Status: DC
  Filled 2021-12-19: qty 3, 56d supply, fill #0

## 2021-12-19 MED ORDER — IMIPRAMINE HCL 50 MG PO TABS
50.0000 mg | ORAL_TABLET | Freq: Every day | ORAL | 3 refills | Status: DC
  Filled 2021-12-19: qty 90, 90d supply, fill #0
  Filled 2022-03-12: qty 90, 90d supply, fill #1
  Filled 2022-06-17: qty 20, 20d supply, fill #2
  Filled 2022-06-17: qty 70, 70d supply, fill #2
  Filled 2022-11-06: qty 90, 90d supply, fill #3

## 2021-12-19 MED ORDER — ATORVASTATIN CALCIUM 40 MG PO TABS
40.0000 mg | ORAL_TABLET | Freq: Every day | ORAL | 3 refills | Status: DC
Start: 2021-12-19 — End: 2023-02-24
  Filled 2021-12-19: qty 90, 90d supply, fill #0
  Filled 2022-03-12: qty 90, 90d supply, fill #1
  Filled 2022-06-17: qty 90, 90d supply, fill #2
  Filled 2022-11-06: qty 90, 90d supply, fill #3

## 2021-12-19 MED ORDER — AMLODIPINE-OLMESARTAN 10-40 MG PO TABS
1.0000 | ORAL_TABLET | Freq: Every day | ORAL | 3 refills | Status: DC
Start: 2021-12-19 — End: 2024-02-02
  Filled 2021-12-19: qty 90, 90d supply, fill #0
  Filled 2022-03-12: qty 90, 90d supply, fill #1
  Filled 2022-06-17: qty 90, 90d supply, fill #2
  Filled 2022-11-06: qty 90, 90d supply, fill #3

## 2021-12-19 MED ORDER — METFORMIN HCL ER 500 MG PO TB24
2000.0000 mg | ORAL_TABLET | Freq: Every day | ORAL | 3 refills | Status: DC
  Filled 2021-12-19: qty 360, 90d supply, fill #0
  Filled 2022-03-12: qty 360, 90d supply, fill #1

## 2021-12-19 MED ORDER — OMRON 3 SERIES BP MONITOR DEVI
0 refills | Status: AC
Start: 2021-12-19 — End: ?
  Filled 2021-12-19: qty 1, 1d supply, fill #0

## 2021-12-19 NOTE — Progress Notes (Addendum)
Established Patient Office Visit  Subjective:  Patient ID: [name], female    DOB: 09-08-1961  Age: 61 y.o. MRN: [id number]  CC:  Chief Complaint  Patient presents with   Transitions Of Care    TOC from Dr. Loletha Grayer, no concerns. Patient fasting.     HPI [name] presents for establishment of care by way of transfer. She is here for a physical and follow up of  significant medical history of hypertension, diabetes, hyperlipidemia and obesity there is a lot of stress in her life.  Multiple extended family members with chronic medical illnesses.  Her husband is dealing with pain associated with work injury.  Seeing GYN for female care and mammograms.  Due for colonoscopy.  Exercises when she can.  Interested in weight loss drugs.  Hypertension controlled with Azor.  Taking atorvastatin for cholesterol.  Diabetes not controlled with Sitagliptin and metformin.  Past Medical History:  Diagnosis Date   Diabetes mellitus without complication ([place])    Endometriosis    Fibroids    uterine   GERD (gastroesophageal reflux disease)    Hepatitis C antibody test positive 11/2015   undetectable HCV RNA so cured and immune, liver nml on CT 06/2016   History of hiatal hernia    History of kidney stones 2014, 2017   Hyperlipidemia    Hypertension    Neuromuscular disorder ([place]) ~2000   "exposed nerve" Lt flank   Obesity (BMI 30-39.9)    Peptic stricture of esophagus    Uterine fibroid     Past Surgical History:  Procedure Laterality Date   ABDOMINAL HYSTERECTOMY  2008   BREAST CYST EXCISION  2007   COLONOSCOPY     EXPLORATORY LAPAROTOMY  ~2005   MASS EXCISION Left 07/11/2016   Procedure: EXCISION MASS LEFT UPPER ARM;  Surgeon: Armandina Gemma, MD;  Location: [place];  Service: General;  Laterality: Left;    Family History  Problem Relation Age of Onset   Diabetes Mother    Hypertension Father    Heart attack Father    Heart disease Sister    Diabetes Sister     Heart disease Brother    Diabetes Brother    Hyperlipidemia Brother    Stroke Brother    Colon cancer Neg Hx    Colon polyps Neg Hx    Esophageal cancer Neg Hx    Rectal cancer Neg Hx    Stomach cancer Neg Hx     Social History   Socioeconomic History   Marital status: Married    Spouse name: Not on file   Number of children: Not on file   Years of education: Not on file   Highest education level: Not on file  Occupational History   Occupation: Pharmacy Tech   Tobacco Use   Smoking status: Never   Smokeless tobacco: Never  Vaping Use   Vaping Use: Never used  Substance and Sexual Activity   Alcohol use: Yes    Alcohol/week: 1.0 standard drink of alcohol    Types: 1 Glasses of wine per week    Comment: 1-2 per week   Drug use: No   Sexual activity: Yes  Other Topics Concern   Not on file  Social History Narrative   2 caffeine drinks daily    Social Determinants of Health   Financial Resource Strain: Not on file  Food Insecurity: Not on file  Transportation Needs: Not on file  Physical Activity: Not on file  Stress: Not on file  Social Connections: Not on file  Intimate Partner Violence: Not on file    Outpatient Medications Prior to Visit  Medication Sig Dispense Refill   aspirin 81 MG tablet Take 81 mg by mouth daily.     Calcium-Vitamin D (CALTRATE 600 PLUS-VIT D PO) Take 1 tablet by mouth 2 (two) times daily.     amLODipine-olmesartan (AZOR) 10-40 MG tablet TAKE 1 TABLET BY MOUTH DAILY. 90 tablet 3   atorvastatin (LIPITOR) 40 MG tablet TAKE 1 TABLET (40 MG TOTAL) BY MOUTH DAILY. 90 tablet 3   esomeprazole (NEXIUM) 40 MG capsule TAKE 1 CAPSULE (40 MG TOTAL) BY MOUTH AT BEDTIME. 90 capsule 3   imipramine (TOFRANIL) 50 MG tablet TAKE 1 TABLET (50 MG TOTAL) BY MOUTH AT BEDTIME. 90 tablet 3   metFORMIN (GLUCOPHAGE-XR) 500 MG 24 hr tablet Take 4 tablets (2,000 mg total) by mouth daily with breakfast. 360 tablet 3   sitaGLIPtin (JANUVIA) 50 MG tablet TAKE 1 TABLET  (50 MG TOTAL) BY MOUTH DAILY. 90 tablet 3   glucose blood test strip Use to check blood sugar daily. 100 each 3   glucose monitoring kit (FREESTYLE) monitoring kit 1 each by Does not apply route as needed for other. 1 each 0   Lancets MISC USE TO CHECK BLOOD SUGAR DAILY. 100 each 3   COVID-19 At Home Antigen Test (CARESTART COVID-19 HOME TEST) KIT Use as directed 4 each 0   lidocaine (LIDODERM) 5 % Place 1 patch onto the skin daily. Remove & Discard patch within 12 hours or as directed by MD 30 patch 0   meloxicam (MOBIC) 15 MG tablet Take 1 tablet (15 mg total) by mouth daily. 30 tablet 1   ondansetron (ZOFRAN ODT) 4 MG disintegrating tablet Take 1 tablet (4 mg total) by mouth every 8 (eight) hours as needed for nausea or vomiting. (Patient not taking: No sig reported) 20 tablet 0   No facility-administered medications prior to visit.    Allergies  Allergen Reactions   Darvocet [Propoxyphene N-Acetaminophen] Nausea Only   Lisinopril Cough   Losartan Cough    ROS Review of Systems  Constitutional: Negative.   HENT: Negative.    Eyes:  Negative for photophobia and visual disturbance.  Respiratory: Negative.    Cardiovascular: Negative.   Gastrointestinal: Negative.   Genitourinary: Negative.   Musculoskeletal:  Negative for gait problem and joint swelling.  Neurological:  Negative for speech difficulty, weakness and light-headedness.      Objective:    Physical Exam Vitals and nursing note reviewed.  Constitutional:      General: She is not in acute distress.    Appearance: Normal appearance. She is not ill-appearing, toxic-appearing or diaphoretic.  HENT:     Head: Normocephalic and atraumatic.     Right Ear: Tympanic membrane, ear canal and external ear normal.     Left Ear: Tympanic membrane, ear canal and external ear normal.     Mouth/Throat:     Mouth: Mucous membranes are moist.     Pharynx: Oropharynx is clear. No oropharyngeal exudate or posterior oropharyngeal  erythema.  Eyes:     Extraocular Movements: Extraocular movements intact.     Conjunctiva/sclera: Conjunctivae normal.     Pupils: Pupils are equal, round, and reactive to light.  Cardiovascular:     Rate and Rhythm: Normal rate and regular rhythm.  Pulmonary:     Effort: Pulmonary effort is normal.     Breath sounds: Normal breath  sounds.  Abdominal:     General: Bowel sounds are normal.  Musculoskeletal:     Cervical back: No rigidity or tenderness.     Right lower leg: No edema.     Left lower leg: No edema.  Lymphadenopathy:     Cervical: No cervical adenopathy.  Skin:    General: Skin is warm and dry.  Neurological:     Mental Status: She is alert and oriented to person, place, and time.  Psychiatric:        Mood and Affect: Mood normal.        Behavior: Behavior normal.     BP 128/76 (BP Location: Left Arm, Patient Position: Sitting, Cuff Size: Large)   Pulse (!) 110   Temp (!) 97.5 F (36.4 C) (Temporal)   Ht 5' 3" (1.6 m)   Wt 184 lb 12.8 oz (83.8 kg)   SpO2 93%   BMI 32.74 kg/m  Wt Readings from Last 3 Encounters:  03/14/22 177 lb 12.8 oz (80.6 kg)  01/17/22 184 lb (83.5 kg)  12/27/21 184 lb (83.5 kg)     Health Maintenance Due  Topic Date Due   MAMMOGRAM  07/26/2020   PAP SMEAR-Modifier  07/26/2021   INFLUENZA VACCINE  04/08/2022    There are no preventive care reminders to display for this patient.  Lab Results  Component Value Date   TSH 1.780 04/19/2019   Lab Results  Component Value Date   WBC 7.2 12/19/2021   HGB 12.0 12/19/2021   HCT 37.1 12/19/2021   MCV 79.4 12/19/2021   PLT 338.0 12/19/2021   Lab Results  Component Value Date   NA 136 03/14/2022   K 3.6 03/14/2022   CO2 29 03/14/2022   GLUCOSE 119 (H) 03/14/2022   BUN 13 03/14/2022   CREATININE 0.57 03/14/2022   BILITOT 0.7 12/19/2021   ALKPHOS 108 12/19/2021   AST 17 12/19/2021   ALT 23 12/19/2021   PROT 7.8 12/19/2021   ALBUMIN 4.2 12/19/2021   CALCIUM 9.7 03/14/2022    ANIONGAP 6 06/18/2016   GFR 98.73 03/14/2022   Lab Results  Component Value Date   CHOL 132 03/14/2022   Lab Results  Component Value Date   HDL 37.30 (L) 03/14/2022   Lab Results  Component Value Date   LDLCALC 77 03/14/2022   Lab Results  Component Value Date   TRIG 88.0 03/14/2022   Lab Results  Component Value Date   CHOLHDL 4 03/14/2022   Lab Results  Component Value Date   HGBA1C 7.9 (H) 03/14/2022      Assessment & Plan:   Problem List Items Addressed This Visit       Cardiovascular and Mediastinum   Essential hypertension   Relevant Medications   amLODipine-olmesartan (AZOR) 10-40 MG tablet   atorvastatin (LIPITOR) 40 MG tablet   Other Relevant Orders   Comprehensive metabolic panel (Completed)   Urinalysis (Completed)   Microalbumin / creatinine urine ratio (Completed)   CBC (Completed)     Digestive   Gastroesophageal reflux disease   Relevant Medications   esomeprazole (NEXIUM) 40 MG capsule     Endocrine   Poorly controlled diabetes mellitus (HCC)   Relevant Medications   amLODipine-olmesartan (AZOR) 10-40 MG tablet   atorvastatin (LIPITOR) 40 MG tablet   Other Relevant Orders   Comprehensive metabolic panel (Completed)   Hemoglobin A1c (Completed)   Microalbumin / creatinine urine ratio (Completed)     Other   Hyperlipidemia LDL goal <100  Relevant Medications   amLODipine-olmesartan (AZOR) 10-40 MG tablet   atorvastatin (LIPITOR) 40 MG tablet   Other Relevant Orders   Comprehensive metabolic panel (Completed)   Lipid panel (Completed)   Neuropathic pain   Relevant Medications   imipramine (TOFRANIL) 50 MG tablet   Class 1 obesity due to excess calories with body mass index (BMI) of 32.0 to 32.9 in adult   Screen for colon cancer   Relevant Orders   Ambulatory referral to Gastroenterology   Other Visit Diagnoses     Healthcare maintenance    -  Primary       Meds ordered this encounter  Medications    amLODipine-olmesartan (AZOR) 10-40 MG tablet    Sig: TAKE 1 TABLET BY MOUTH DAILY.    Dispense:  90 tablet    Refill:  3   atorvastatin (LIPITOR) 40 MG tablet    Sig: Take 1 tablet (40 mg total) by mouth daily.    Dispense:  90 tablet    Refill:  3   esomeprazole (NEXIUM) 40 MG capsule    Sig: Take 1 capsule (40 mg total) by mouth at bedtime.    Dispense:  90 capsule    Refill:  3   imipramine (TOFRANIL) 50 MG tablet    Sig: Take 1 tablet (50 mg total) by mouth at bedtime.    Dispense:  90 tablet    Refill:  3   DISCONTD: metFORMIN (GLUCOPHAGE-XR) 500 MG 24 hr tablet    Sig: Take 4 tablets (2,000 mg total) by mouth daily with breakfast.    Dispense:  360 tablet    Refill:  3   DISCONTD: Semaglutide,0.25 or 0.5MG/DOS, 2 MG/1.5ML SOPN    Sig: Inject 0.5 mg into the skin once a week.    Dispense:  3 mL    Refill:  3    Follow-up: Return in about 3 months (around 03/20/2022), or if symptoms worsen or fail to improve.  Has  discontinued Sitagliptin and will start semaglutide.  She is aware of thyroid cancer C laboratory [facility] used for development of the medication.  Discussed high carbohydrate loads associated with the major side effect of nausea.  Encouraged regular exercise for 30 minutes daily.  She is aware that losing weight is more about what goes in her mouth and exercise.  Continue all other medications.  Is planning on follow-up with her GYN who also schedules her mammograms.  We will see her dentist in a few months.  Continue all current medications  Libby Maw, MD  05/26/22 addendum: CPE should have been billed on 4/13.

## 2021-12-20 ENCOUNTER — Encounter: Payer: Self-pay | Admitting: Internal Medicine

## 2021-12-27 ENCOUNTER — Other Ambulatory Visit: Payer: Self-pay

## 2021-12-27 ENCOUNTER — Other Ambulatory Visit (HOSPITAL_COMMUNITY): Payer: Self-pay

## 2021-12-27 ENCOUNTER — Ambulatory Visit (AMBULATORY_SURGERY_CENTER): Payer: 59

## 2021-12-27 VITALS — Ht 63.0 in | Wt 184.0 lb

## 2021-12-27 DIAGNOSIS — Z1211 Encounter for screening for malignant neoplasm of colon: Secondary | ICD-10-CM

## 2021-12-27 MED ORDER — NA SULFATE-K SULFATE-MG SULF 17.5-3.13-1.6 GM/177ML PO SOLN
1.0000 | Freq: Once | ORAL | 0 refills | Status: AC
Start: 2021-12-27 — End: 2022-01-01
  Filled 2021-12-27: qty 354, 1d supply, fill #0

## 2021-12-27 NOTE — Progress Notes (Signed)

## 2021-12-31 ENCOUNTER — Other Ambulatory Visit (HOSPITAL_COMMUNITY): Payer: Self-pay

## 2022-01-08 ENCOUNTER — Encounter: Payer: Self-pay | Admitting: Internal Medicine

## 2022-01-14 ENCOUNTER — Other Ambulatory Visit (HOSPITAL_COMMUNITY): Payer: Self-pay

## 2022-01-17 ENCOUNTER — Ambulatory Visit (AMBULATORY_SURGERY_CENTER): Payer: 59 | Admitting: Internal Medicine

## 2022-01-17 ENCOUNTER — Encounter: Payer: Self-pay | Admitting: Internal Medicine

## 2022-01-17 VITALS — BP 129/70 | HR 86 | Temp 99.1°F | Resp 12 | Ht 63.0 in | Wt 184.0 lb

## 2022-01-17 DIAGNOSIS — E669 Obesity, unspecified: Secondary | ICD-10-CM | POA: Diagnosis not present

## 2022-01-17 DIAGNOSIS — E119 Type 2 diabetes mellitus without complications: Secondary | ICD-10-CM | POA: Diagnosis not present

## 2022-01-17 DIAGNOSIS — Z1211 Encounter for screening for malignant neoplasm of colon: Secondary | ICD-10-CM

## 2022-01-17 DIAGNOSIS — I10 Essential (primary) hypertension: Secondary | ICD-10-CM | POA: Diagnosis not present

## 2022-01-17 MED ORDER — SODIUM CHLORIDE 0.9 % IV SOLN: 500.0000 mL | Freq: Once | INTRAVENOUS | Status: AC

## 2022-01-17 NOTE — Patient Instructions (Signed)
Please read handouts provided. ?Continue present medications. ?Repeat colonoscopy in 10 years for screening. ? ? ?YOU HAD AN ENDOSCOPIC PROCEDURE TODAY AT [facility]:   Refer to the procedure report that was given to you for any specific questions about what was found during the examination.  If the procedure report does not answer your questions, please call your gastroenterologist to clarify.  If you requested that your care partner not be given the details of your procedure findings, then the procedure report has been included in a sealed envelope for you to review at your convenience later. ? ?YOU SHOULD EXPECT: Some feelings of bloating in the abdomen. Passage of more gas than usual.  Walking can help get rid of the air that was put into your GI tract during the procedure and reduce the bloating. If you had a lower endoscopy (such as a colonoscopy or flexible sigmoidoscopy) you may notice spotting of blood in your stool or on the toilet paper. If you underwent a bowel prep for your procedure, you may not have a normal bowel movement for a few days. ? ?Please Note:  You might notice some irritation and congestion in your nose or some drainage.  This is from the oxygen used during your procedure.  There is no need for concern and it should clear up in a day or so. ? ?SYMPTOMS TO REPORT IMMEDIATELY: ? ?Following lower endoscopy (colonoscopy or flexible sigmoidoscopy): ? Excessive amounts of blood in the stool ? Significant tenderness or worsening of abdominal pains ? Swelling of the abdomen that is new, acute ? Fever of 100?F or higher ? ? ?For urgent or emergent issues, a gastroenterologist can be reached at any hour by calling [phone]. ?Do not use MyChart messaging for urgent concerns.  ? ? ?DIET:  We do recommend a small meal at first, but then you may proceed to your regular diet.  Drink plenty of fluids but you should avoid alcoholic beverages for 24 hours. ? ?ACTIVITY:  You should  plan to take it easy for the rest of today and you should NOT DRIVE or use heavy machinery until tomorrow (because of the sedation medicines used during the test).   ? ?FOLLOW UP: ?Our staff will call the number listed on your records 48-72 hours following your procedure to check on you and address any questions or concerns that you may have regarding the information given to you following your procedure. If we do not reach you, we will leave a message.  We will attempt to reach you two times.  During this call, we will ask if you have developed any symptoms of COVID 19. If you develop any symptoms (ie: fever, flu-like symptoms, shortness of breath, cough etc.) before then, please call [phone].  If you test positive for Covid 19 in the 2 weeks post procedure, please call and report this information to [place].   ? ?If any biopsies were taken you will be contacted by phone or by letter within the next 1-3 weeks.  Please call us at [phone] if you have not heard about the biopsies in 3 weeks.  ? ? ?SIGNATURES/CONFIDENTIALITY: ?You and/or your care partner have signed paperwork which will be entered into your electronic medical record.  These signatures attest to the fact that that the information above on your After Visit Summary has been reviewed and is understood.  Full responsibility of the confidentiality of this discharge information lies with you and/or your care-partner.  ?

## 2022-01-17 NOTE — Progress Notes (Signed)
Pt's states no medical or surgical changes since previsit or office visit. 

## 2022-01-17 NOTE — Progress Notes (Signed)
A and O x3. Report to RN. Tolerated MAC anesthesia well. 

## 2022-01-17 NOTE — Op Note (Signed)
[facility] ?Patient Name: [name] ?Procedure Date: 01/17/2022 2:44 PM ?MRN: [id number] ?Endoscopist: Docia Chuck. Henrene Pastor , MD ?Age: 61 ?Referring MD:  ?Date of Birth: [birth year] ?Gender: Female ?Account #: [id number] ?Procedure:                Colonoscopy ?Indications:              Screening for colorectal malignant neoplasm. Prior  ?                          exam 2006 was normal ?Medicines:                Monitored Anesthesia Care ?Procedure:                Pre-Anesthesia Assessment: ?                          - Prior to the procedure, a History and Physical  ?                          was performed, and patient medications and  ?                          allergies were reviewed. The patient's tolerance of  ?                          previous anesthesia was also reviewed. The risks  ?                          and benefits of the procedure and the sedation  ?                          options and risks were discussed with the patient.  ?                          All questions were answered, and informed consent  ?                          was obtained. Prior Anticoagulants: The patient has  ?                          taken no previous anticoagulant or antiplatelet  ?                          agents. ASA Grade Assessment: II - A patient with  ?                          mild systemic disease. After reviewing the risks  ?                          and benefits, the patient was deemed in  ?                          satisfactory condition to undergo the procedure. ?  After obtaining informed consent, the colonoscope  ?                          was passed under direct vision. Throughout the  ?                          procedure, the patient's blood pressure, pulse, and  ?                          oxygen saturations were monitored continuously. The  ?                          Olympus CF-HQ190L (Serial# [id number]) Colonoscope was  ?                          introduced through the anus and advanced  to the the  ?                          cecum, identified by appendiceal orifice and  ?                          ileocecal valve. The ileocecal valve, appendiceal  ?                          orifice, and rectum were photographed. The quality  ?                          of the bowel preparation was excellent. The  ?                          colonoscopy was performed without difficulty. The  ?                          patient tolerated the procedure well. The bowel  ?                          preparation used was SUPREP via split dose  ?                          instruction. ?Scope In: 2:57:07 PM ?Scope Out: 3:07:09 PM ?Scope Withdrawal Time: 0 hours 7 minutes 15 seconds  ?Total Procedure Duration: 0 hours 10 minutes 2 seconds  ?Findings:                 The entire examined colon appeared normal on direct  ?                          and retroflexion views. ?Complications:            No immediate complications. Estimated blood loss:  ?                          None. ?Estimated Blood Loss:     Estimated blood loss: none. ?Impression:               - The entire examined colon  is normal on direct and  ?                          retroflexion views. ?                          - No specimens collected. ?Recommendation:           - Repeat colonoscopy in 10 years for screening  ?                          purposes. ?                          - Patient has a contact number available for  ?                          emergencies. The signs and symptoms of potential  ?                          delayed complications were discussed with the  ?                          patient. Return to normal activities tomorrow.  ?                          Written discharge instructions were provided to the  ?                          patient. ?                          - Resume previous diet. ?                          - Continue present medications. ?Docia Chuck. Henrene Pastor, MD ?01/17/2022 3:12:06 PM ?This report has been signed electronically. ?

## 2022-01-17 NOTE — Progress Notes (Signed)
HISTORY OF PRESENT ILLNESS: ? ?MEILYN HEINDL is a 61 y.o. female who presents for screening colonoscopy.  Previous examination 2006 was normal. ? ?REVIEW OF SYSTEMS: ? ?All non-GI ROS negative. ?Past Medical History:  ?Diagnosis Date  ? Diabetes mellitus without complication ([place])   ? Endometriosis   ? Fibroids   ? uterine  ? GERD (gastroesophageal reflux disease)   ? Hepatitis C antibody test positive 11/2015  ? undetectable HCV RNA so cured and immune, liver nml on CT 06/2016  ? History of hiatal hernia   ? History of kidney stones 2014, 2017  ? Hyperlipidemia   ? Hypertension   ? Neuromuscular disorder ([place]) ~2000  ? "exposed nerve" Lt flank  ? Obesity (BMI 30-39.9)   ? Peptic stricture of esophagus   ? Uterine fibroid   ? ? ?Past Surgical History:  ?Procedure Laterality Date  ? ABDOMINAL HYSTERECTOMY  2008  ? BREAST CYST EXCISION  2007  ? COLONOSCOPY    ? EXPLORATORY LAPAROTOMY  ~2005  ? MASS EXCISION Left 07/11/2016  ? Procedure: EXCISION MASS LEFT UPPER ARM;  Surgeon: Armandina Gemma, MD;  Location: [facility];  Service: General;  Laterality: Left;  ? ? ?Social History ?SAMA ARAUZ  reports that she has never smoked. She has never used smokeless tobacco. She reports current alcohol use of about 1.0 standard drink per week. She reports that she does not use drugs. ? ?family history includes Diabetes in her brother, mother, and sister; Heart attack in her father; Heart disease in her brother and sister; Hyperlipidemia in her brother; Hypertension in her father; Stroke in her brother. ? ?Allergies  ?Allergen Reactions  ? Darvocet [Propoxyphene N-Acetaminophen] Nausea Only  ? Lisinopril Cough  ? Losartan Cough  ? ? ?  ? ?PHYSICAL EXAMINATION: ? ?Vital signs: BP 133/75   Pulse (!) 108   Temp 99.1 ?F (37.3 ?C)   Ht '5\' 3"'$  (1.6 m)   Wt 184 lb (83.5 kg)   SpO2 98%   BMI 32.59 kg/m?  ?General: Well-developed, well-nourished, no acute distress ?HEENT: Sclerae are anicteric, conjunctiva pink. Oral  mucosa intact ?Lungs: Clear ?Heart: Regular ?Abdomen: soft, nontender, nondistended, no obvious ascites, no peritoneal signs, normal bowel sounds. No organomegaly. ?Extremities: No edema ?Psychiatric: alert and oriented x3. Cooperative  ? ? ? ?ASSESSMENT: ? ?Colon cancer screening ? ? ?PLAN: ? ?Screening colonoscopy ? ? ? ? ?  ?

## 2022-01-21 ENCOUNTER — Telehealth: Payer: Self-pay

## 2022-01-21 NOTE — Telephone Encounter (Signed)
?  Follow up Call- ? ? ?  01/17/2022  ?  2:01 PM  ?Call back number  ?Post procedure Call Back phone  # [phone]  ?Permission to leave phone message Yes  ?  ? ?Patient questions: ? ?Do you have a fever, pain , or abdominal swelling? No. ?Pain Score  0 * ? ?Have you tolerated food without any problems? Yes.   ? ?Have you been able to return to your normal activities? Yes.   ? ?Do you have any questions about your discharge instructions: ?Diet   No. ?Medications  No. ?Follow up visit  No. ? ?Do you have questions or concerns about your Care? No. ? ?Actions: ?* If pain score is 4 or above: ?No action needed, pain <4. ? ? ?

## 2022-02-13 ENCOUNTER — Other Ambulatory Visit (HOSPITAL_COMMUNITY): Payer: Self-pay

## 2022-02-13 MED ORDER — OZEMPIC (0.25 OR 0.5 MG/DOSE) 2 MG/3ML ~~LOC~~ SOPN
0.5000 mg | PEN_INJECTOR | SUBCUTANEOUS | 5 refills | Status: DC
  Filled 2022-02-13: qty 3, 28d supply, fill #0
  Filled 2022-03-12: qty 3, 28d supply, fill #1

## 2022-03-10 ENCOUNTER — Ambulatory Visit: Payer: 59 | Admitting: Family Medicine

## 2022-03-12 ENCOUNTER — Other Ambulatory Visit (HOSPITAL_COMMUNITY): Payer: Self-pay

## 2022-03-14 ENCOUNTER — Other Ambulatory Visit (HOSPITAL_COMMUNITY): Payer: Self-pay

## 2022-03-14 ENCOUNTER — Encounter: Payer: Self-pay | Admitting: Family Medicine

## 2022-03-14 ENCOUNTER — Ambulatory Visit: Payer: 59 | Admitting: Family Medicine

## 2022-03-14 VITALS — BP 118/68 | HR 95 | Temp 97.6°F | Ht 63.0 in | Wt 177.8 lb

## 2022-03-14 DIAGNOSIS — I10 Essential (primary) hypertension: Secondary | ICD-10-CM

## 2022-03-14 DIAGNOSIS — E785 Hyperlipidemia, unspecified: Secondary | ICD-10-CM

## 2022-03-14 DIAGNOSIS — E1165 Type 2 diabetes mellitus with hyperglycemia: Secondary | ICD-10-CM | POA: Diagnosis not present

## 2022-03-14 LAB — BASIC METABOLIC PANEL
BUN: 13 mg/dL (ref 6–23)
CO2: 29 mEq/L (ref 19–32)
Calcium: 9.7 mg/dL (ref 8.4–10.5)
Chloride: 98 mEq/L (ref 96–112)
Creatinine, Ser: 0.57 mg/dL (ref 0.40–1.20)
GFR: 98.73 mL/min (ref >60.00)
Glucose, Bld: 119 mg/dL — ABNORMAL HIGH (ref 70–99)
Potassium: 3.6 mEq/L (ref 3.5–5.1)
Sodium: 136 mEq/L (ref 135–145)

## 2022-03-14 LAB — LIPID PANEL
Cholesterol: 132 mg/dL (ref 0–200)
HDL: 37.3 mg/dL — ABNORMAL LOW (ref >39.00)
LDL Cholesterol: 77 mg/dL (ref 0–99)
NonHDL: 94.68
Total CHOL/HDL Ratio: 4
Triglycerides: 88 mg/dL (ref 0.0–149.0)
VLDL: 17.6 mg/dL (ref 0.0–40.0)

## 2022-03-14 LAB — HEMOGLOBIN A1C: Hgb A1c MFr Bld: 7.9 % — ABNORMAL HIGH (ref 4.6–6.5)

## 2022-03-14 MED ORDER — OZEMPIC (0.25 OR 0.5 MG/DOSE) 2 MG/3ML ~~LOC~~ SOPN
0.5000 mg | PEN_INJECTOR | SUBCUTANEOUS | 5 refills | Status: DC
  Filled 2022-03-14 – 2022-04-07 (×2): qty 3, 28d supply, fill #0

## 2022-03-14 MED ORDER — METFORMIN HCL ER 500 MG PO TB24
1000.0000 mg | ORAL_TABLET | Freq: Two times a day (BID) | ORAL | 3 refills | Status: DC
  Filled 2022-03-14 – 2022-06-17 (×2): qty 360, 90d supply, fill #0
  Filled 2022-11-06: qty 360, 90d supply, fill #1

## 2022-03-14 NOTE — Progress Notes (Addendum)
Established Patient Office Visit  Subjective   Patient ID: [name], female    DOB: 1961-05-21  Age: 61 y.o. MRN: [id number]  Chief Complaint  Patient presents with   Office Visit    3 month HTN F/u Checks BP 2-3 times a week , (upper 120's 130's) Ozempic concerns , may be moving slow.      HPI follow-up of hypertension, poorly controlled diabetes and elevated ldl cholesterol.  Has been able to lose some weight on the semaglutide.  Experiences nausea on occasion.  Admits to drinking sweetsy colas.  Sometimes eat sweets.  Taking atorvastatin daily.  Will need GYN check and eye check. No regular exercise. Seeing dentist regularly.     Review of Systems  Constitutional:  Negative for chills, diaphoresis, malaise/fatigue and weight loss.  HENT: Negative.    Eyes: Negative.  Negative for blurred vision and double vision.  Cardiovascular:  Negative for chest pain.  Gastrointestinal:  Negative for abdominal pain.  Genitourinary: Negative.   Musculoskeletal:  Negative for falls and myalgias.  Neurological:  Negative for speech change, loss of consciousness and weakness.  Psychiatric/Behavioral: Negative.        Objective:     BP 118/68 (BP Location: Right Arm, Patient Position: Sitting, Cuff Size: Small)   Pulse 95   Temp 97.6 F (36.4 C) (Temporal)   Ht '5\' 3"'$  (1.6 m)   Wt 177 lb 12.8 oz (80.6 kg)   SpO2 95%   BMI 31.50 kg/m  BP Readings from Last 3 Encounters:  03/14/22 118/68  01/17/22 129/70  12/19/21 128/76   Wt Readings from Last 3 Encounters:  03/14/22 177 lb 12.8 oz (80.6 kg)  01/17/22 184 lb (83.5 kg)  12/27/21 184 lb (83.5 kg)      Physical Exam Constitutional:      General: She is not in acute distress.    Appearance: Normal appearance. She is not ill-appearing, toxic-appearing or diaphoretic.  HENT:     Head: Normocephalic and atraumatic.     Right Ear: External ear normal.     Left Ear: External ear normal.     Mouth/Throat:     Mouth:  Mucous membranes are moist.     Pharynx: Oropharynx is clear. No oropharyngeal exudate or posterior oropharyngeal erythema.  Eyes:     General: No scleral icterus.       Right eye: No discharge.        Left eye: No discharge.     Extraocular Movements: Extraocular movements intact.     Conjunctiva/sclera: Conjunctivae normal.     Pupils: Pupils are equal, round, and reactive to light.  Cardiovascular:     Rate and Rhythm: Normal rate and regular rhythm.     Pulses:          Dorsalis pedis pulses are 2+ on the right side and 2+ on the left side.       Posterior tibial pulses are 2+ on the right side and 2+ on the left side.  Pulmonary:     Effort: Pulmonary effort is normal. No respiratory distress.     Breath sounds: Normal breath sounds.  Skin:    General: Skin is warm and dry.  Neurological:     Mental Status: She is alert and oriented to person, place, and time.  Psychiatric:        Mood and Affect: Mood normal.        Behavior: Behavior normal.    Diabetic Foot Exam - Simple  Simple Foot Form Diabetic Foot exam was performed with the following findings: Yes 03/14/2022  9:37 AM  Visual Inspection No deformities, no ulcerations, no other skin breakdown bilaterally: Yes Sensation Testing Intact to touch and monofilament testing bilaterally: Yes Pulse Check Posterior Tibialis and Dorsalis pulse intact bilaterally: Yes Comments      No results found for any visits on 03/14/22.    The 10-year ASCVD risk score (Arnett DK, et al., 2019) is: 7.4%    Assessment & Plan:   Problem List Items Addressed This Visit       Cardiovascular and Mediastinum   Essential hypertension - Primary   Relevant Orders   Basic metabolic panel     Endocrine   Poorly controlled diabetes mellitus ([place])   Relevant Medications   Semaglutide,0.25 or 0.'5MG'$ /DOS, (OZEMPIC, 0.25 OR 0.5 MG/DOSE,) 2 MG/3ML SOPN   metFORMIN (GLUCOPHAGE-XR) 500 MG 24 hr tablet   Other Relevant Orders   Basic  metabolic panel   Hemoglobin A1c     Other   Hyperlipidemia LDL goal <100   Relevant Orders   Lipid panel    Return in about 6 months (around 09/14/2022), or May need 11-monthfollow-up pending lab results..  We will schedule eye exam and GYN check.  May need to increase atorvastatin to 80 mg.  Discussed with patient.  Avoid all sweetsy drinks and sweets in general.  Particularly sweetsy  drinks.  Continue semaglutide and metformin.  Continue atorvastatin. Exercise by walking would be great.   WLibby Maw MD

## 2022-03-19 ENCOUNTER — Encounter: Payer: Self-pay | Admitting: Family Medicine

## 2022-04-07 ENCOUNTER — Other Ambulatory Visit (HOSPITAL_COMMUNITY): Payer: Self-pay

## 2022-04-24 ENCOUNTER — Telehealth: Payer: Self-pay | Admitting: Family Medicine

## 2022-04-24 NOTE — Telephone Encounter (Signed)
Please advise message below  °

## 2022-04-24 NOTE — Telephone Encounter (Signed)
Caller Name: Miangel Flom  Call back phone #: [phone]  Reason for Call: 4/13 had an appt with Dr. Ethelene Hal, she says that physical was completed during this appointment but is coded wrong. 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

## 2022-04-28 ENCOUNTER — Encounter: Payer: Self-pay | Admitting: Family Medicine

## 2022-04-28 NOTE — Addendum Note (Signed)
Addended by: Abelino Derrick A on: 04/28/2022 10:08 AM   Modules accepted: Level of Service

## 2022-04-29 NOTE — Telephone Encounter (Signed)
Patient requesting that last visit coding be changed to Physical instead of office visit for insurance. Please advise.

## 2022-05-05 ENCOUNTER — Encounter: Payer: Self-pay | Admitting: Family Medicine

## 2022-05-05 DIAGNOSIS — E1165 Type 2 diabetes mellitus with hyperglycemia: Secondary | ICD-10-CM

## 2022-05-07 ENCOUNTER — Other Ambulatory Visit (HOSPITAL_COMMUNITY): Payer: Self-pay

## 2022-05-07 MED ORDER — SEMAGLUTIDE (1 MG/DOSE) 4 MG/3ML ~~LOC~~ SOPN
1.0000 mg | PEN_INJECTOR | SUBCUTANEOUS | 1 refills | Status: DC
  Filled 2022-05-07: qty 9, 84d supply, fill #0
  Filled 2022-06-17 – 2022-07-16 (×3): qty 9, 84d supply, fill #1

## 2022-05-14 NOTE — Telephone Encounter (Signed)
LVM for pt to call back with more detailed information. The bill was paid. We may have to have coding team review the claim.

## 2022-05-26 ENCOUNTER — Encounter: Payer: Self-pay | Admitting: Family Medicine

## 2022-05-26 NOTE — Addendum Note (Signed)
Addended by: Jon Billings on: 05/26/2022 12:39 PM   Modules accepted: Level of Service

## 2022-06-17 ENCOUNTER — Other Ambulatory Visit (HOSPITAL_COMMUNITY): Payer: Self-pay

## 2022-06-19 ENCOUNTER — Other Ambulatory Visit (HOSPITAL_COMMUNITY): Payer: Self-pay

## 2022-07-07 ENCOUNTER — Ambulatory Visit
Admission: EM | Admit: 2022-07-07 | Discharge: 2022-07-07 | Disposition: A | Discharge status: Home / Self Care | Payer: 59 | Attending: Emergency Medicine | Admitting: Emergency Medicine

## 2022-07-07 ENCOUNTER — Other Ambulatory Visit (HOSPITAL_COMMUNITY): Payer: Self-pay

## 2022-07-07 ENCOUNTER — Ambulatory Visit (INDEPENDENT_AMBULATORY_CARE_PROVIDER_SITE_OTHER): Payer: 59

## 2022-07-07 DIAGNOSIS — M5136 Other intervertebral disc degeneration, lumbar region: Secondary | ICD-10-CM

## 2022-07-07 DIAGNOSIS — M5441 Lumbago with sciatica, right side: Secondary | ICD-10-CM | POA: Diagnosis not present

## 2022-07-07 DIAGNOSIS — M47816 Spondylosis without myelopathy or radiculopathy, lumbar region: Secondary | ICD-10-CM

## 2022-07-07 DIAGNOSIS — M545 Low back pain, unspecified: Secondary | ICD-10-CM

## 2022-07-07 MED ORDER — DICLOFENAC SODIUM 1 % EX GEL
4.0000 g | Freq: Four times a day (QID) | CUTANEOUS | 2 refills | Status: DC
  Filled 2022-07-07: qty 100, 7d supply, fill #0

## 2022-07-07 MED ORDER — ACETAMINOPHEN 500 MG PO TABS
1000.0000 mg | ORAL_TABLET | Freq: Three times a day (TID) | ORAL | 0 refills | Status: AC
  Filled 2022-07-07: qty 180, 30d supply, fill #0

## 2022-07-07 MED ORDER — KETOROLAC TROMETHAMINE 30 MG/ML IJ SOLN
30.0000 mg | Freq: Once | INTRAMUSCULAR | Status: AC
  Administered 2022-07-07: 30 mg via INTRAMUSCULAR

## 2022-07-07 MED ORDER — METHYLPREDNISOLONE 4 MG PO TBPK
ORAL_TABLET | ORAL | 0 refills | Status: AC
  Filled 2022-07-07: qty 21, 6d supply, fill #0

## 2022-07-07 MED ORDER — BACLOFEN 10 MG PO TABS
10.0000 mg | ORAL_TABLET | Freq: Three times a day (TID) | ORAL | 0 refills | Status: DC
  Filled 2022-07-07: qty 21, 7d supply, fill #0

## 2022-07-07 NOTE — ED Triage Notes (Signed)
Pt c/o lower right back pain that radiates down right leg that started yesterday.   Home interventions: advil

## 2022-07-07 NOTE — Discharge Instructions (Addendum)
The x-ray of your lumbar spine did not reveal any significant changes compared to your previous lumbar x-ray which was performed January 2022.  I believe this is good news and that you should have a good recovery from this particular episode of lower back pain.  The mainstay of therapy for musculoskeletal pain is reduction of inflammation and relaxation of tension which is causing inflammation.  Keep in mind, pain always begets more pain.  To help you stay ahead of your pain and inflammation, I have provided the following regimen for you:   During your visit today, you received an injection of ketorolac, high-dose nonsteroidal anti-inflammatory pain medication that should significantly reduce your pain for the next 6 to 8 hours.    Please begin taking Tylenol 1000 mg 3 times daily (every 8 hours) as soon as you pick up your prescriptions from the pharmacy.   This evening, you can begin taking baclofen 10 mg.  This is a highly effective muscle relaxer and antispasmodic which should continue to provide you with relaxation of your tense muscles, allow you to sleep well and to keep your pain under control.  You can continue taking this medication 3 times daily as you need to.  If you find that this medication makes you too sleepy, you can break them in half for your daytime doses and, if needed double them for your nighttime dose.  Do not take more than 30 mg of baclofen in a 24-hour period.   Tomorrow morning, please begin taking methylprednisolone.  Please take 1 full row tablets at once with your breakfast meal.  If you have had significant resolution of your pain before you finish the entire prescription, please feel free to discontinue.  It is not important to finish every ta dose blet.   During the day, please set aside time to apply ice to the affected area 4 times daily for 20 minutes each application.  This can be achieved by using a bag of frozen peas or corn, a Ziploc bag filled with ice and  water, or Ziploc bag filled with half rubbing alcohol and half Dawn dish detergent, frozen into a slush.  Please be careful not to apply ice directly to your skin, always place a soft cloth between you and the ice pack.   You are welcome to use topical anti-inflammatory creams such as Voltaren gel, capsaicin or Aspercreme as recommended.  These medications are available over-the-counter, please follow manufactures instructions for use.  As a courtesy, I provided you with a prescription for diclofenac in the event that your insurance will pay for this.   Please consider discussing referral to physical therapy with your primary care provider.  Physical therapist are very good at teasing out the underlying cause of acute lower back pain and helping with prevention of future recurrences.   Please avoid attempts to stretch or strengthen the affected area until you are feeling completely pain-free.  Attempts to do so will only prolong the healing process.   If you would like to try to return return to urgent care in the next 2 to 3 days for repeat ketorolac injection, you are welcome to do so.   I also recommend that you remain out of work for the next several days, I provided you with a note to return to work in 3 days.  If you feel that you need this time extended, please follow-up with your primary care provider or return to urgent care for reevaluation so that we  can provide you with a note for another 3 days.   Thank you for visiting urgent care today.  We appreciate the opportunity to participate in your care.

## 2022-07-07 NOTE — ED Provider Notes (Signed)
[facility]    CSN: [id number] Arrival date & time: 07/07/22  1137    HISTORY   Chief Complaint  Patient presents with  . Back Pain  . Leg Pain   HPI [name] is a pleasant, 61 y.o. female who presents to urgent care today. Patient complains of acute onset of recurrent right-sided lower back pain.  Patient states the pain began yesterday and describes the pain as if she had been playing with her granddaughter a little too much, patient states she has not been performing any unusual activity that she feels would be causing her this new pain.  Patient states has been taking Advil without meaningful relief of symptoms.  Patient has a history of lumbar degenerative disc disease and lumbar facet arthropathy, underwent physical therapy in April 2022 for this issue.  Last imaging was performed January 2022.  Patient states that her pain last year was limited to her lower back and rendered her unable to walk, states she now has pain radiating down her right leg, which is new, and is still able to ambulate, states her pain at this time is not nearly as bad as it was in January 2022.  Patient denies loss of bowel bladder function, recent fall, missed, tingling, weakness of her right lower extremity, perineal numbness.  The history is provided by the patient.   Past Medical History:  Diagnosis Date  . Diabetes mellitus without complication ([place])   . Endometriosis   . Fibroids    uterine  . GERD (gastroesophageal reflux disease)   . 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 History of hiatal hernia   . History of kidney stones 2014, 2017  . Hyperlipidemia   . Hypertension   . Neuromuscular disorder ([place]) ~2000   "exposed nerve" Lt flank  . Obesity (BMI 30-39.9)   . Peptic stricture of esophagus   . Uterine fibroid    Patient Active Problem List   Diagnosis Date Noted  . Class 1 obesity due to excess  calories with body mass index (BMI) of 32.0 to 32.9 in adult 12/19/2021  . Screen for colon cancer 12/19/2021  . Poorly controlled diabetes mellitus ([place]) 12/19/2021  . Acute bilateral low back pain without sciatica 10/12/2020  . Type 2 diabetes mellitus without complication, without long-term current use of insulin ([place]) 07/10/2017  . Left flank pain, chronic 07/10/2017  . Gastroesophageal reflux disease 03/10/2017  . Neuropathic pain 03/10/2017  . Vitamin D deficiency 03/10/2017  . Hyperlipidemia LDL goal <100 03/25/2013  . Essential hypertension 03/25/2013  . Hyperglycemia 11/01/2012   Past Surgical History:  Procedure Laterality Date  . ABDOMINAL HYSTERECTOMY  2008  . BREAST CYST EXCISION  2007  . COLONOSCOPY    . EXPLORATORY LAPAROTOMY  ~2005  . MASS EXCISION Left 07/11/2016   Procedure: EXCISION MASS LEFT UPPER ARM;  Surgeon: Armandina Gemma, MD;  Location: [facility];  Service: General;  Laterality: Left;   OB History   No obstetric history on file.    Home Medications    Prior to Admission medications   Medication Sig Start Date End Date Taking? Authorizing Provider  amLODipine-olmesartan (AZOR) 10-40 MG tablet TAKE 1 TABLET BY MOUTH DAILY. 12/19/21 12/19/22  Libby Maw, MD  aspirin 81 MG tablet Take 81 mg by mouth daily.    [provider]  atorvastatin (LIPITOR) 40 MG tablet Take 1 tablet (  40 mg total) by mouth daily. 12/19/21   Libby Maw, MD  Blood Pressure Monitoring (OMRON 3 SERIES BP MONITOR) DEVI Use as directed 12/19/21     Calcium-Vitamin D (CALTRATE 600 PLUS-VIT D PO) Take 1 tablet by mouth 2 (two) times daily.    [provider]  esomeprazole (NEXIUM) 40 MG capsule Take 1 capsule (40 mg total) by mouth at bedtime. 12/19/21   Libby Maw, MD  glucose blood test strip Use to check blood sugar daily. 03/30/13   Shawnee Knapp, MD  glucose monitoring kit (FREESTYLE) monitoring kit 1 each by Does not apply  route as needed for other. 06/25/20   Cirigliano, Mary K, DO  imipramine (TOFRANIL) 50 MG tablet Take 1 tablet (50 mg total) by mouth at bedtime. 12/19/21   Libby Maw, MD  Lancets MISC USE TO CHECK BLOOD SUGAR DAILY. 03/30/13   Shawnee Knapp, MD  metFORMIN (GLUCOPHAGE-XR) 500 MG 24 hr tablet Take 2 tablets (1,000 mg total) by mouth 2 (two) times daily before a meal. 03/14/22   Libby Maw, MD  Semaglutide, 1 MG/DOSE, 4 MG/3ML SOPN Inject 1 mg as directed once a week. 05/07/22   Libby Maw, MD    Family History Family History  Problem Relation Age of Onset  . Diabetes Mother   . Hypertension Father   . Heart attack Father   . Heart disease Sister   . Diabetes Sister   . Heart disease Brother   . Diabetes Brother   . Hyperlipidemia Brother   . Stroke Brother   . Colon cancer Neg Hx   . Colon polyps Neg Hx   . Esophageal cancer Neg Hx   . Rectal cancer Neg Hx   . Stomach cancer Neg Hx    Social History Social History   Tobacco Use  . Smoking status: Never  . Smokeless tobacco: Never  Vaping Use  . Vaping Use: Never used  Substance Use Topics  . Alcohol use: Yes    Alcohol/week: 1.0 standard drink of alcohol    Types: 1 Glasses of wine per week    Comment: 1-2 per week  . Drug use: No   Allergies   Darvocet [propoxyphene n-acetaminophen], Lisinopril, and Losartan  Review of Systems Review of Systems Pertinent findings revealed after performing a 14 point review of systems has been noted in the history of present illness.  Physical Exam Triage Vital Signs ED Triage Vitals  Enc Vitals Group     BP 07/05/21 0827 (!) 147/82     Pulse Rate 07/05/21 0827 72     Resp 07/05/21 0827 18     Temp 07/05/21 0827 98.3 F (36.8 C)     Temp Source 07/05/21 0827 Oral     SpO2 07/05/21 0827 98 %     Weight --      Height --      Head Circumference --      Peak Flow --      Pain Score 07/05/21 0826 5     Pain Loc --      Pain Edu? --      Excl.  in [place]? --    Updated Vital Signs BP 120/78 (BP Location: Right Arm)   Pulse (!) 105   Temp 97.8 F (36.6 C) (Oral)   Resp 16   SpO2 96%   Physical Exam Vitals and nursing note reviewed.  Constitutional:      General: She is not in  acute distress.    Appearance: Normal appearance.  HENT:     Head: Normocephalic and atraumatic.  Eyes:     Pupils: Pupils are equal, round, and reactive to light.  Cardiovascular:     Rate and Rhythm: Normal rate and regular rhythm.  Pulmonary:     Effort: Pulmonary effort is normal.     Breath sounds: Normal breath sounds.  Musculoskeletal:     Cervical back: Normal range of motion and neck supple.     Lumbar back: Spasms and tenderness present. No swelling, edema, deformity, signs of trauma, lacerations or bony tenderness. Decreased range of motion. Positive right straight leg raise test and positive left straight leg raise test. No scoliosis.  Skin:    General: Skin is warm and dry.  Neurological:     General: No focal deficit present.     Mental Status: She is alert and oriented to person, place, and time. Mental status is at baseline.  Psychiatric:        Mood and Affect: Mood normal.        Behavior: Behavior normal.        Thought Content: Thought content normal.        Judgment: Judgment normal.    [facility] / Diagnostics / Procedures:     Radiology DG Lumbar Spine Complete  Result Date: 07/07/2022 CLINICAL DATA:  Back pain EXAM: LUMBAR SPINE - COMPLETE 4 VIEW COMPARISON:  Lumbar spine radiograph dated October 07, 2020 FINDINGS: Vertebral body heights are well-maintained. Normal alignment. Mild multilevel degenerative disc disease, unchanged when compared with prior exam and most pronounced at L4-L5. Left transverse process L5 pseudo articulation with the sacrum, unchanged when compared with the prior. Mild facet arthropathy of the lower lumbar spine, unchanged when compared with the prior. Soft tissues are unremarkable. IMPRESSION:  Mild multilevel degenerative disc disease and facet arthropathy, unchanged when compared with prior exam. Electronically Signed   By: Yetta Glassman M.D.   On: 07/07/2022 14:14    Procedures Procedures (including critical care time) EKG  Pending results:  Labs Reviewed - No data to display  Medications Ordered in UC: Medications  ketorolac (TORADOL) 30 MG/ML injection 30 mg (30 mg Intramuscular Given 07/07/22 1353)    UC Diagnoses / Final Clinical Impressions(s)   I have reviewed the triage vital signs and the nursing notes.  Pertinent labs & imaging results that were available during my care of the patient were reviewed by me and considered in my medical decision making (see chart for details).    Final diagnoses:  Lumbar degenerative disc disease  Lumbar facet arthropathy  Acute right-sided low back pain with right-sided sciatica    Patient was provided with an injection during their visit today for acute pain relief.  Patient was advised to:  Begin Medrol dose pack Take Baclofen 10 mg 3 times daily (Patient has been advised that if this makes them sleepy, they can just take this at bedtime, up to 20 mg per dose, and try breaking the tablets in half or 5 mg per dose during the day) Begin acetaminophen 1000 mg 3 times daily on a scheduled basis. Apply ice pack to affected area 4 times daily for 20 minutes each time Apply topical Voltaren gel 4 times daily as needed Avoid stretching or strengthening exercises until pain is completely resolved Return to urgent care in the next 2 to 3 days for repeat ketorolac injection if needed Consider physical therapy, chiropractic care, orthopedic follow-up Return precautions advised  ED Prescriptions     Medication Sig Dispense Auth. Provider   methylPREDNISolone (MEDROL DOSEPAK) 4 MG TBPK tablet Take 24 mg on day 1, 20 mg on day 2, 16 mg on day 3, 12 mg on day 4, 8 mg on day 5, 4 mg on day 6.  Take all tablets in each row at once, do  not spread tablets out throughout the day. 21 tablet Lynden Oxford Scales, PA-C   acetaminophen (TYLENOL) 500 MG tablet Take 2 tablets (1,000 mg total) by mouth every 8 (eight) hours. 180 tablet Lynden Oxford Scales, PA-C   diclofenac Sodium (VOLTAREN) 1 % GEL Apply 4 g topically 4 (four) times daily. Apply to affected areas 4 times daily as needed for pain. 100 g Lynden Oxford Scales, PA-C   baclofen (LIORESAL) 10 MG tablet Take 1 tablet (10 mg total) by mouth 3 (three) times daily for 7 days. 21 tablet Lynden Oxford Scales, PA-C      PDMP not reviewed this encounter.  Discharge Instructions:   Discharge Instructions      The x-ray of your lumbar spine did not reveal any significant changes compared to your previous lumbar x-ray which was performed January 2022.  I believe this is good news and that you should have a good recovery from this particular episode of lower back pain.  The mainstay of therapy for musculoskeletal pain is reduction of inflammation and relaxation of tension which is causing inflammation.  Keep in mind, pain always begets more pain.  To help you stay ahead of your pain and inflammation, I have provided the following regimen for you:   During your visit today, you received an injection of ketorolac, high-dose nonsteroidal anti-inflammatory pain medication that should significantly reduce your pain for the next 6 to 8 hours.    Please begin taking Tylenol 1000 mg 3 times daily (every 8 hours) as soon as you pick up your prescriptions from the pharmacy.   This evening, you can begin taking baclofen 10 mg.  This is a highly effective muscle relaxer and antispasmodic which should continue to provide you with relaxation of your tense muscles, allow you to sleep well and to keep your pain under control.  You can continue taking this medication 3 times daily as you need to.  If you find that this medication makes you too sleepy, you can break them in half for your  daytime doses and, if needed double them for your nighttime dose.  Do not take more than 30 mg of baclofen in a 24-hour period.   Tomorrow morning, please begin taking methylprednisolone.  Please take 1 full row tablets at once with your breakfast meal.  If you have had significant resolution of your pain before you finish the entire prescription, please feel free to discontinue.  It is not important to finish every ta dose blet.   During the day, please set aside time to apply ice to the affected area 4 times daily for 20 minutes each application.  This can be achieved by using a bag of frozen peas or corn, a Ziploc bag filled with ice and water, or Ziploc bag filled with half rubbing alcohol and half Dawn dish detergent, frozen into a slush.  Please be careful not to apply ice directly to your skin, always place a soft cloth between you and the ice pack.   You are welcome to use topical anti-inflammatory creams such as Voltaren gel, capsaicin or Aspercreme as recommended.  These medications are available  over-the-counter, please follow manufactures instructions for use.  As a courtesy, I provided you with a prescription for diclofenac in the event that your insurance will pay for this.   Please consider discussing referral to physical therapy with your primary care provider.  Physical therapist are very good at teasing out the underlying cause of acute lower back pain and helping with prevention of future recurrences.   Please avoid attempts to stretch or strengthen the affected area until you are feeling completely pain-free.  Attempts to do so will only prolong the healing process.   If you would like to try to return return to urgent care in the next 2 to 3 days for repeat ketorolac injection, you are welcome to do so.   I also recommend that you remain out of work for the next several days, I provided you with a note to return to work in 3 days.  If you feel that you need this time extended,  please follow-up with your primary care provider or return to urgent care for reevaluation so that we can provide you with a note for another 3 days.   Thank you for visiting urgent care today.  We appreciate the opportunity to participate in your care.         Disposition Upon Discharge:  Condition: stable for discharge home Home: take medications as prescribed; routine discharge instructions as discussed; follow up as advised.  Patient presented with an acute illness with associated systemic symptoms and significant discomfort requiring urgent management. In my opinion, this is a condition that a prudent lay person (someone who possesses an average knowledge of health and medicine) may potentially expect to result in complications if not addressed urgently such as respiratory distress, impairment of bodily function or dysfunction of bodily organs.   Routine symptom specific, illness specific and/or disease specific instructions were discussed with the patient and/or caregiver at length.   As such, the patient has been evaluated and assessed, work-up was performed and treatment was provided in alignment with urgent care protocols and evidence based medicine.  Patient/parent/caregiver has been advised that the patient may require follow up for further testing and treatment if the symptoms continue in spite of treatment, as clinically indicated and appropriate.  Patient/parent/caregiver has been advised to report to orthopedic urgent care clinic or return to the [facility] or PCP in 3-5 days if no better; follow-up with orthopedics, PCP or the Emergency Department if new signs and symptoms develop or if the current signs or symptoms continue to change or worsen for further workup, evaluation and treatment as clinically indicated and appropriate  The patient will follow up with their current PCP if and as advised. If the patient does not currently have a PCP we will have assisted them in obtaining one.    The patient may need specialty follow up if the symptoms continue, in spite of conservative treatment and management, for further workup, evaluation, consultation and treatment as clinically indicated and appropriate.  Patient/parent/caregiver verbalized understanding and agreement of plan as discussed.  All questions were addressed during visit.  Please see discharge instructions below for further details of plan.  This office note has been dictated using Museum/gallery curator.  Unfortunately, this method of dictation can sometimes lead to typographical or grammatical errors.  I apologize for your inconvenience in advance if this occurs.  Please do not hesitate to reach out to me if clarification is needed.      Lynden Oxford Scales, PA-C 07/08/22  2003

## 2022-07-16 ENCOUNTER — Other Ambulatory Visit (HOSPITAL_COMMUNITY): Payer: Self-pay

## 2022-08-02 DIAGNOSIS — H1033 Unspecified acute conjunctivitis, bilateral: Secondary | ICD-10-CM | POA: Diagnosis not present

## 2022-08-02 DIAGNOSIS — J019 Acute sinusitis, unspecified: Secondary | ICD-10-CM | POA: Diagnosis not present

## 2022-08-21 DIAGNOSIS — Z1231 Encounter for screening mammogram for malignant neoplasm of breast: Secondary | ICD-10-CM | POA: Diagnosis not present

## 2022-08-21 DIAGNOSIS — Z6830 Body mass index (BMI) 30.0-30.9, adult: Secondary | ICD-10-CM | POA: Diagnosis not present

## 2022-08-21 DIAGNOSIS — Z01419 Encounter for gynecological examination (general) (routine) without abnormal findings: Secondary | ICD-10-CM | POA: Diagnosis not present

## 2022-09-09 ENCOUNTER — Ambulatory Visit: Payer: 59 | Admitting: Family Medicine

## 2022-09-16 ENCOUNTER — Ambulatory Visit (INDEPENDENT_AMBULATORY_CARE_PROVIDER_SITE_OTHER): Payer: Commercial Managed Care - PPO | Admitting: Family Medicine

## 2022-09-16 ENCOUNTER — Encounter: Payer: Self-pay | Admitting: Family Medicine

## 2022-09-16 ENCOUNTER — Other Ambulatory Visit (HOSPITAL_COMMUNITY): Payer: Self-pay

## 2022-09-16 VITALS — BP 118/68 | HR 106 | Temp 97.6°F | Ht 63.0 in | Wt 170.4 lb

## 2022-09-16 DIAGNOSIS — E119 Type 2 diabetes mellitus without complications: Secondary | ICD-10-CM | POA: Diagnosis not present

## 2022-09-16 DIAGNOSIS — I10 Essential (primary) hypertension: Secondary | ICD-10-CM

## 2022-09-16 DIAGNOSIS — Z800 Family history of malignant neoplasm of digestive organs: Secondary | ICD-10-CM

## 2022-09-16 DIAGNOSIS — E785 Hyperlipidemia, unspecified: Secondary | ICD-10-CM

## 2022-09-16 LAB — CBC
HCT: 37.9 % (ref 36.0–46.0)
Hemoglobin: 12.5 g/dL (ref 12.0–15.0)
MCHC: 32.9 g/dL (ref 30.0–36.0)
MCV: 83.4 fl (ref 78.0–100.0)
Platelets: 355 10*3/uL (ref 150.0–400.0)
RBC: 4.54 Mil/uL (ref 3.87–5.11)
RDW: 14.6 % (ref 11.5–15.5)
WBC: 6.3 10*3/uL (ref 4.0–10.5)

## 2022-09-16 LAB — URINALYSIS, ROUTINE W REFLEX MICROSCOPIC
Bilirubin Urine: NEGATIVE
Hgb urine dipstick: NEGATIVE
Ketones, ur: NEGATIVE
Leukocytes,Ua: NEGATIVE
Nitrite: NEGATIVE
RBC / HPF: NONE SEEN (ref 0–?)
Specific Gravity, Urine: 1.02 (ref 1.000–1.030)
Total Protein, Urine: NEGATIVE
Urine Glucose: NEGATIVE
Urobilinogen, UA: 0.2 (ref 0.0–1.0)
pH: 7 (ref 5.0–8.0)

## 2022-09-16 LAB — COMPREHENSIVE METABOLIC PANEL
ALT: 21 U/L (ref 0–35)
AST: 17 U/L (ref 0–37)
Albumin: 4.5 g/dL (ref 3.5–5.2)
Alkaline Phosphatase: 89 U/L (ref 39–117)
BUN: 12 mg/dL (ref 6–23)
CO2: 28 mEq/L (ref 19–32)
Calcium: 9.4 mg/dL (ref 8.4–10.5)
Chloride: 100 mEq/L (ref 96–112)
Creatinine, Ser: 0.63 mg/dL (ref 0.40–1.20)
GFR: 96.03 mL/min (ref >60.00)
Glucose, Bld: 111 mg/dL — ABNORMAL HIGH (ref 70–99)
Potassium: 4.3 mEq/L (ref 3.5–5.1)
Sodium: 138 mEq/L (ref 135–145)
Total Bilirubin: 0.5 mg/dL (ref 0.2–1.2)
Total Protein: 7.8 g/dL (ref 6.0–8.3)

## 2022-09-16 LAB — LDL CHOLESTEROL, DIRECT: Direct LDL: 93 mg/dL

## 2022-09-16 LAB — MICROALBUMIN / CREATININE URINE RATIO
Creatinine,U: 141.6 mg/dL
Microalb Creat Ratio: 1.7 mg/g (ref 0.0–30.0)
Microalb, Ur: 2.5 mg/dL — ABNORMAL HIGH (ref 0.0–1.9)

## 2022-09-16 LAB — HEMOGLOBIN A1C: Hgb A1c MFr Bld: 6.7 % — ABNORMAL HIGH (ref 4.6–6.5)

## 2022-09-16 MED ORDER — SEMAGLUTIDE (2 MG/DOSE) 8 MG/3ML ~~LOC~~ SOPN
2.0000 mg | PEN_INJECTOR | SUBCUTANEOUS | 5 refills | Status: DC
  Filled 2022-09-16 – 2022-11-17 (×3): qty 3, 28d supply, fill #0
  Filled 2022-12-15: qty 3, 28d supply, fill #1
  Filled 2023-01-12: qty 3, 28d supply, fill #2
  Filled 2023-02-24: qty 3, 28d supply, fill #3
  Filled 2023-03-30: qty 3, 28d supply, fill #4
  Filled 2023-06-08: qty 3, 28d supply, fill #5

## 2022-09-16 NOTE — Progress Notes (Signed)
Established Patient Office Visit   Subjective:  Patient ID: [name], female    DOB: June 17, 1961  Age: 62 y.o. MRN: [id number]  Chief Complaint  Patient presents with   Follow-up    6 month follow up, discuss blood work to check for possible cancer. Sister just found out she has stage 4 cancer no family history. Patient fasting.     HPI Encounter Diagnoses  Name Primary?   Type 2 diabetes mellitus without complication, without long-term current use of insulin ([place]) Yes   Essential hypertension    Hyperlipidemia LDL goal <100    Family history of pancreatic cancer    For follow-up of above.  Continues with 2 mg of semaglutide weekly along with the thousand twice daily if metformin.  Continues Azor for control blood pressure.  40 mg of atorvastatin cholesterol controlled.  Sister recently diagnosed with pancreatic cancer.  Of course she is devastated   Review of Systems  Constitutional: Negative.   HENT: Negative.    Eyes:  Negative for blurred vision, discharge and redness.  Respiratory: Negative.    Cardiovascular: Negative.   Gastrointestinal:  Negative for abdominal pain.  Genitourinary: Negative.   Musculoskeletal: Negative.  Negative for myalgias.  Skin:  Negative for rash.  Neurological:  Negative for tingling, loss of consciousness and weakness.  Endo/Heme/Allergies:  Negative for polydipsia.     Current Outpatient Medications:    amLODipine-olmesartan (AZOR) 10-40 MG tablet, TAKE 1 TABLET BY MOUTH DAILY., Disp: 90 tablet, Rfl: 3   aspirin 81 MG tablet, Take 81 mg by mouth daily., Disp: , Rfl:    atorvastatin (LIPITOR) 40 MG tablet, Take 1 tablet (40 mg total) by mouth daily., Disp: 90 tablet, Rfl: 3   Blood Pressure Monitoring (OMRON 3 SERIES BP MONITOR) DEVI, Use as directed, Disp: 1 each, Rfl: 0   Calcium-Vitamin D (CALTRATE 600 PLUS-VIT D PO), Take 1 tablet by mouth 2 (two) times daily., Disp: , Rfl:    esomeprazole (NEXIUM) 40 MG capsule, Take 1 capsule (40  mg total) by mouth at bedtime., Disp: 90 capsule, Rfl: 3   imipramine (TOFRANIL) 50 MG tablet, Take 1 tablet (50 mg total) by mouth at bedtime., Disp: 90 tablet, Rfl: 3   metFORMIN (GLUCOPHAGE-XR) 500 MG 24 hr tablet, Take 2 tablets (1,000 mg total) by mouth 2 (two) times daily before a meal., Disp: 360 tablet, Rfl: 3   Semaglutide, 2 MG/DOSE, 8 MG/3ML SOPN, Inject 2 mg as directed once a week., Disp: 3 mL, Rfl: 5   diclofenac Sodium (VOLTAREN) 1 % GEL, Apply 4 g topically to affected areas 4 times daily as needed for pain. (Patient not taking: Reported on 09/16/2022), Disp: 100 g, Rfl: 2   glucose blood test strip, Use to check blood sugar daily. (Patient not taking: Reported on 09/16/2022), Disp: 100 each, Rfl: 3   glucose monitoring kit (FREESTYLE) monitoring kit, 1 each by Does not apply route as needed for other. (Patient not taking: Reported on 09/16/2022), Disp: 1 each, Rfl: 0   Lancets MISC, USE TO CHECK BLOOD SUGAR DAILY. (Patient not taking: Reported on 09/16/2022), Disp: 100 each, Rfl: 3  Current Facility-Administered Medications:    0.9 %  sodium chloride infusion, 500 mL, Intravenous, Once, Irene Shipper, MD   Objective:     BP 118/68   Pulse (!) 106   Temp 97.6 F (36.4 C) (Temporal)   Ht '5\' 3"'$  (1.6 m)   Wt 170 lb 6.4 oz (77.3 kg)  SpO2 95%   BMI 30.19 kg/m  BP Readings from Last 3 Encounters:  09/16/22 118/68  07/07/22 120/78  03/14/22 118/68   Wt Readings from Last 3 Encounters:  09/16/22 170 lb 6.4 oz (77.3 kg)  03/14/22 177 lb 12.8 oz (80.6 kg)  01/17/22 184 lb (83.5 kg)      Physical Exam Constitutional:      General: She is not in acute distress.    Appearance: Normal appearance. She is not ill-appearing, toxic-appearing or diaphoretic.  HENT:     Head: Normocephalic and atraumatic.     Right Ear: External ear normal.     Left Ear: External ear normal.     Mouth/Throat:     Mouth: Mucous membranes are moist.     Pharynx: Oropharynx is clear. No oropharyngeal  exudate or posterior oropharyngeal erythema.  Eyes:     General: No scleral icterus.       Right eye: No discharge.        Left eye: No discharge.     Extraocular Movements: Extraocular movements intact.     Conjunctiva/sclera: Conjunctivae normal.     Pupils: Pupils are equal, round, and reactive to light.  Cardiovascular:     Rate and Rhythm: Normal rate and regular rhythm.  Pulmonary:     Effort: Pulmonary effort is normal. No respiratory distress.     Breath sounds: Normal breath sounds.  Musculoskeletal:     Cervical back: No rigidity or tenderness.  Skin:    General: Skin is warm and dry.  Neurological:     Mental Status: She is alert and oriented to person, place, and time.  Psychiatric:        Mood and Affect: Mood normal.        Behavior: Behavior normal.      No results found for any visits on 09/16/22.    The 10-year ASCVD risk score (Arnett DK, et al., 2019) is: 6.8%    Assessment & Plan:   Type 2 diabetes mellitus without complication, without long-term current use of insulin (HCC) -     Comprehensive metabolic panel -     Hemoglobin A1c -     Urinalysis, Routine w reflex microscopic -     Microalbumin / creatinine urine ratio -     Semaglutide (2 MG/DOSE); Inject 2 mg as directed once a week.  Dispense: 3 mL; Refill: 5  Essential hypertension -     CBC -     Comprehensive metabolic panel -     Urinalysis, Routine w reflex microscopic -     Microalbumin / creatinine urine ratio  Hyperlipidemia LDL goal <100 -     LDL cholesterol, direct  Family history of pancreatic cancer    Return in about 6 months (around 03/17/2023).  Explained that pancreatic cancer does not follow a familial pattern.  She is up-to-date on health maintenance screenings.  Encouraged exercise and ongoing weight loss.    Libby Maw, MD

## 2022-09-22 ENCOUNTER — Ambulatory Visit: Payer: 59 | Admitting: Family Medicine

## 2022-09-22 DIAGNOSIS — H5203 Hypermetropia, bilateral: Secondary | ICD-10-CM | POA: Diagnosis not present

## 2022-09-22 DIAGNOSIS — H52223 Regular astigmatism, bilateral: Secondary | ICD-10-CM | POA: Diagnosis not present

## 2022-09-22 DIAGNOSIS — E119 Type 2 diabetes mellitus without complications: Secondary | ICD-10-CM | POA: Diagnosis not present

## 2022-09-22 DIAGNOSIS — H53143 Visual discomfort, bilateral: Secondary | ICD-10-CM | POA: Diagnosis not present

## 2022-09-22 DIAGNOSIS — H2513 Age-related nuclear cataract, bilateral: Secondary | ICD-10-CM | POA: Diagnosis not present

## 2022-09-22 DIAGNOSIS — H524 Presbyopia: Secondary | ICD-10-CM | POA: Diagnosis not present

## 2022-09-22 LAB — HM DIABETES EYE EXAM

## 2022-11-06 ENCOUNTER — Other Ambulatory Visit (HOSPITAL_COMMUNITY): Payer: Self-pay

## 2022-11-17 ENCOUNTER — Other Ambulatory Visit (HOSPITAL_COMMUNITY): Payer: Self-pay

## 2022-11-24 ENCOUNTER — Other Ambulatory Visit (HOSPITAL_COMMUNITY): Payer: Self-pay

## 2022-12-12 ENCOUNTER — Encounter: Payer: Self-pay | Admitting: Family Medicine

## 2022-12-15 ENCOUNTER — Other Ambulatory Visit: Payer: Self-pay

## 2023-01-30 ENCOUNTER — Other Ambulatory Visit (HOSPITAL_COMMUNITY): Payer: Self-pay

## 2023-02-13 ENCOUNTER — Telehealth: Payer: Self-pay | Admitting: Family Medicine

## 2023-02-17 NOTE — Telephone Encounter (Signed)
error 

## 2023-02-24 ENCOUNTER — Other Ambulatory Visit (HOSPITAL_COMMUNITY): Payer: Self-pay

## 2023-02-24 ENCOUNTER — Other Ambulatory Visit: Payer: Self-pay

## 2023-02-24 ENCOUNTER — Other Ambulatory Visit: Payer: Self-pay | Admitting: Family Medicine

## 2023-02-24 DIAGNOSIS — E785 Hyperlipidemia, unspecified: Secondary | ICD-10-CM

## 2023-02-24 DIAGNOSIS — M792 Neuralgia and neuritis, unspecified: Secondary | ICD-10-CM

## 2023-02-24 DIAGNOSIS — K219 Gastro-esophageal reflux disease without esophagitis: Secondary | ICD-10-CM

## 2023-02-24 MED ORDER — ESOMEPRAZOLE MAGNESIUM 40 MG PO CPDR
40.0000 mg | DELAYED_RELEASE_CAPSULE | Freq: Every evening | ORAL | 3 refills | Status: DC
  Filled 2023-02-24: qty 90, 90d supply, fill #0
  Filled 2023-06-08: qty 90, 90d supply, fill #1
  Filled 2024-02-02: qty 90, 90d supply, fill #2

## 2023-02-24 MED ORDER — ATORVASTATIN CALCIUM 40 MG PO TABS
40.0000 mg | ORAL_TABLET | Freq: Every day | ORAL | 3 refills | Status: DC
  Filled 2023-02-24: qty 90, 90d supply, fill #0
  Filled 2024-02-02: qty 90, 90d supply, fill #1

## 2023-02-24 MED ORDER — IMIPRAMINE HCL 50 MG PO TABS
50.0000 mg | ORAL_TABLET | Freq: Every day | ORAL | 3 refills | Status: DC
  Filled 2023-02-24: qty 90, 90d supply, fill #0
  Filled 2023-06-08: qty 90, 90d supply, fill #1
  Filled 2024-02-02: qty 90, 90d supply, fill #2

## 2023-03-17 ENCOUNTER — Ambulatory Visit: Payer: Commercial Managed Care - PPO | Admitting: Family Medicine

## 2023-03-18 ENCOUNTER — Other Ambulatory Visit: Payer: Self-pay | Admitting: Oncology

## 2023-03-18 DIAGNOSIS — Z006 Encounter for examination for normal comparison and control in clinical research program: Secondary | ICD-10-CM

## 2023-03-20 ENCOUNTER — Ambulatory Visit: Payer: Commercial Managed Care - PPO | Admitting: Family Medicine

## 2023-04-01 ENCOUNTER — Ambulatory Visit: Payer: Commercial Managed Care - PPO | Admitting: Family Medicine

## 2023-04-01 ENCOUNTER — Encounter: Payer: Self-pay | Admitting: Family Medicine

## 2023-04-01 VITALS — BP 134/72 | HR 102 | Temp 97.3°F | Ht 63.0 in | Wt 168.8 lb

## 2023-04-01 DIAGNOSIS — E119 Type 2 diabetes mellitus without complications: Secondary | ICD-10-CM | POA: Diagnosis not present

## 2023-04-01 DIAGNOSIS — E7800 Pure hypercholesterolemia, unspecified: Secondary | ICD-10-CM

## 2023-04-01 DIAGNOSIS — I10 Essential (primary) hypertension: Secondary | ICD-10-CM | POA: Diagnosis not present

## 2023-04-01 DIAGNOSIS — Z23 Encounter for immunization: Secondary | ICD-10-CM

## 2023-04-01 LAB — COMPREHENSIVE METABOLIC PANEL
ALT: 15 U/L (ref 0–35)
AST: 13 U/L (ref 0–37)
Albumin: 4.2 g/dL (ref 3.5–5.2)
Alkaline Phosphatase: 89 U/L (ref 39–117)
BUN: 13 mg/dL (ref 6–23)
CO2: 27 mEq/L (ref 19–32)
Calcium: 9.3 mg/dL (ref 8.4–10.5)
Chloride: 101 mEq/L (ref 96–112)
Creatinine, Ser: 0.6 mg/dL (ref 0.40–1.20)
GFR: 96.8 mL/min (ref >60.00)
Glucose, Bld: 104 mg/dL — ABNORMAL HIGH (ref 70–99)
Potassium: 3.8 mEq/L (ref 3.5–5.1)
Sodium: 138 mEq/L (ref 135–145)
Total Bilirubin: 0.7 mg/dL (ref 0.2–1.2)
Total Protein: 7.3 g/dL (ref 6.0–8.3)

## 2023-04-01 LAB — HEMOGLOBIN A1C: Hgb A1c MFr Bld: 6.3 % (ref 4.6–6.5)

## 2023-04-01 LAB — LIPID PANEL
Cholesterol: 164 mg/dL (ref 0–200)
HDL: 38.8 mg/dL — ABNORMAL LOW (ref >39.00)
LDL Cholesterol: 97 mg/dL (ref 0–99)
NonHDL: 125.05
Total CHOL/HDL Ratio: 4
Triglycerides: 138 mg/dL (ref 0.0–149.0)
VLDL: 27.6 mg/dL (ref 0.0–40.0)

## 2023-04-01 LAB — CBC WITH DIFFERENTIAL/PLATELET
Basophils Absolute: 0 10*3/uL (ref 0.0–0.1)
Basophils Relative: 0.4 % (ref 0.0–3.0)
Eosinophils Absolute: 0.3 10*3/uL (ref 0.0–0.7)
Eosinophils Relative: 3.9 % (ref 0.0–5.0)
HCT: 37.3 % (ref 36.0–46.0)
Hemoglobin: 12.1 g/dL (ref 12.0–15.0)
Lymphocytes Relative: 27.2 % (ref 12.0–46.0)
Lymphs Abs: 1.9 10*3/uL (ref 0.7–4.0)
MCHC: 32.3 g/dL (ref 30.0–36.0)
MCV: 84.2 fl (ref 78.0–100.0)
Monocytes Absolute: 0.5 10*3/uL (ref 0.1–1.0)
Monocytes Relative: 6.9 % (ref 3.0–12.0)
Neutro Abs: 4.3 10*3/uL (ref 1.4–7.7)
Neutrophils Relative %: 61.6 % (ref 43.0–77.0)
Platelets: 321 10*3/uL (ref 150.0–400.0)
RBC: 4.43 Mil/uL (ref 3.87–5.11)
RDW: 14.2 % (ref 11.5–15.5)
WBC: 6.9 10*3/uL (ref 4.0–10.5)

## 2023-04-01 NOTE — Progress Notes (Signed)
Established Patient Office Visit   Subjective:  Patient ID: [name], female    DOB: 1960/12/06  Age: 62 y.o. MRN: [id number]  Chief Complaint  Patient presents with   Medical Management of Chronic Issues    6 month follow up. Pt is fasting. No concerns.     HPI Encounter Diagnoses  Name Primary?   Essential hypertension Yes   Type 2 diabetes mellitus without complication, without long-term current use of insulin (HCC)    Elevated LDL cholesterol level    Immunization due    For follow-up of above.  Doing well.  Continues full-time work.  Sister with pancreatic cancer is holding her own.  She is compliant with all of her medications including taking her atorvastatin daily.   Review of Systems  Constitutional: Negative.   HENT: Negative.    Eyes:  Negative for blurred vision, discharge and redness.  Respiratory: Negative.    Cardiovascular: Negative.   Gastrointestinal:  Negative for abdominal pain.  Genitourinary: Negative.   Musculoskeletal: Negative.  Negative for myalgias.  Skin:  Negative for rash.  Neurological:  Negative for tingling, loss of consciousness and weakness.  Endo/Heme/Allergies:  Negative for polydipsia.     Current Outpatient Medications:    aspirin 81 MG tablet, Take 81 mg by mouth daily., Disp: , Rfl:    atorvastatin (LIPITOR) 40 MG tablet, Take 1 tablet (40 mg total) by mouth daily., Disp: 90 tablet, Rfl: 3   Blood Pressure Monitoring (OMRON 3 SERIES BP MONITOR) DEVI, Use as directed, Disp: 1 each, Rfl: 0   Calcium-Vitamin D (CALTRATE 600 PLUS-VIT D PO), Take 1 tablet by mouth 2 (two) times daily., Disp: , Rfl:    esomeprazole (NEXIUM) 40 MG capsule, Take 1 capsule (40 mg total) by mouth at bedtime., Disp: 90 capsule, Rfl: 3   glucose blood test strip, Use to check blood sugar daily., Disp: 100 each, Rfl: 3   glucose monitoring kit (FREESTYLE) monitoring kit, 1 each by Does not apply route as needed for other., Disp: 1 each, Rfl: 0    imipramine (TOFRANIL) 50 MG tablet, Take 1 tablet (50 mg total) by mouth at bedtime., Disp: 90 tablet, Rfl: 3   Lancets MISC, USE TO CHECK BLOOD SUGAR DAILY., Disp: 100 each, Rfl: 3   metFORMIN (GLUCOPHAGE-XR) 500 MG 24 hr tablet, Take 2 tablets (1,000 mg total) by mouth 2 (two) times daily before a meal., Disp: 360 tablet, Rfl: 3   Semaglutide, 2 MG/DOSE, 8 MG/3ML SOPN, Inject 2 mg as directed once a week., Disp: 3 mL, Rfl: 5   amLODipine-olmesartan (AZOR) 10-40 MG tablet, TAKE 1 TABLET BY MOUTH DAILY., Disp: 90 tablet, Rfl: 3   diclofenac Sodium (VOLTAREN) 1 % GEL, Apply 4 g topically to affected areas 4 times daily as needed for pain. (Patient not taking: Reported on 04/01/2023), Disp: 100 g, Rfl: 2  Current Facility-Administered Medications:    0.9 %  sodium chloride infusion, 500 mL, Intravenous, Once, Hilarie Fredrickson, MD   Objective:     BP 134/72   Pulse (!) 102   Temp (!) 97.3 F (36.3 C)   Ht 5\' 3"  (1.6 m)   Wt 168 lb 12.8 oz (76.6 kg)   SpO2 97%   BMI 29.90 kg/m  Wt Readings from Last 3 Encounters:  04/01/23 168 lb 12.8 oz (76.6 kg)  09/16/22 170 lb 6.4 oz (77.3 kg)  03/14/22 177 lb 12.8 oz (80.6 kg)      Physical Exam Constitutional:  General: She is not in acute distress.    Appearance: Normal appearance. She is not ill-appearing, toxic-appearing or diaphoretic.  HENT:     Head: Normocephalic and atraumatic.     Right Ear: External ear normal.     Left Ear: External ear normal.  Eyes:     General: No scleral icterus.       Right eye: No discharge.        Left eye: No discharge.     Extraocular Movements: Extraocular movements intact.     Conjunctiva/sclera: Conjunctivae normal.  Cardiovascular:     Rate and Rhythm: Normal rate and regular rhythm.  Pulmonary:     Effort: Pulmonary effort is normal. No respiratory distress.     Breath sounds: Normal breath sounds.  Musculoskeletal:     Cervical back: No rigidity or tenderness.  Lymphadenopathy:      Cervical: No cervical adenopathy.  Skin:    General: Skin is warm and dry.  Neurological:     Mental Status: She is alert and oriented to person, place, and time.  Psychiatric:        Mood and Affect: Mood normal.        Behavior: Behavior normal.      No results found for any visits on 04/01/23.    The 10-year ASCVD risk score (Arnett DK, et al., 2019) is: 7.2%    Assessment & Plan:   Essential hypertension -     CBC with Differential/Platelet -     Comprehensive metabolic panel  Type 2 diabetes mellitus without complication, without long-term current use of insulin (HCC) -     Hemoglobin A1c  Elevated LDL cholesterol level -     Lipid panel  Immunization due -     Tdap vaccine greater than or equal to 7yo IM    Return in about 6 months (around 10/02/2023).  Continue all medications as above.  May consider increasing atorvastatin for LDL goal of 70 or better.  Encouraged ongoing weight loss efforts and exercise.  Discussed lowering fat and cholesterol in the diet.  Tdap immunization today.  Mliss Sax, MD

## 2023-07-09 ENCOUNTER — Other Ambulatory Visit: Payer: Self-pay | Admitting: Family Medicine

## 2023-07-09 ENCOUNTER — Other Ambulatory Visit (HOSPITAL_COMMUNITY): Payer: Self-pay

## 2023-07-09 DIAGNOSIS — E119 Type 2 diabetes mellitus without complications: Secondary | ICD-10-CM

## 2023-07-09 MED ORDER — OZEMPIC (2 MG/DOSE) 8 MG/3ML ~~LOC~~ SOPN
2.0000 mg | PEN_INJECTOR | SUBCUTANEOUS | 5 refills | Status: DC
Start: 2023-07-09 — End: 2024-02-02
  Filled 2023-07-09: qty 3, 28d supply, fill #0
  Filled 2023-08-11: qty 3, 28d supply, fill #1
  Filled 2023-09-14: qty 3, 28d supply, fill #2
  Filled 2023-10-21: qty 3, 28d supply, fill #3
  Filled 2023-11-19: qty 3, 28d supply, fill #4
  Filled 2023-12-14: qty 3, 28d supply, fill #5

## 2023-10-02 ENCOUNTER — Encounter: Payer: Self-pay | Admitting: Family Medicine

## 2023-10-02 ENCOUNTER — Ambulatory Visit: Payer: Commercial Managed Care - PPO | Admitting: Family Medicine

## 2023-10-02 VITALS — BP 122/68 | HR 110 | Temp 97.1°F | Ht 63.0 in | Wt 168.0 lb

## 2023-10-02 DIAGNOSIS — E7800 Pure hypercholesterolemia, unspecified: Secondary | ICD-10-CM | POA: Diagnosis not present

## 2023-10-02 DIAGNOSIS — E119 Type 2 diabetes mellitus without complications: Secondary | ICD-10-CM | POA: Diagnosis not present

## 2023-10-02 DIAGNOSIS — Z23 Encounter for immunization: Secondary | ICD-10-CM

## 2023-10-02 DIAGNOSIS — I10 Essential (primary) hypertension: Secondary | ICD-10-CM | POA: Diagnosis not present

## 2023-10-02 DIAGNOSIS — Z7984 Long term (current) use of oral hypoglycemic drugs: Secondary | ICD-10-CM | POA: Diagnosis not present

## 2023-10-02 LAB — URINALYSIS, ROUTINE W REFLEX MICROSCOPIC
Bilirubin Urine: NEGATIVE
Hgb urine dipstick: NEGATIVE
Ketones, ur: NEGATIVE
Leukocytes,Ua: NEGATIVE
Nitrite: NEGATIVE
RBC / HPF: NONE SEEN (ref 0–?)
Specific Gravity, Urine: 1.02 (ref 1.000–1.030)
Total Protein, Urine: NEGATIVE
Urine Glucose: NEGATIVE
Urobilinogen, UA: 0.2 (ref 0.0–1.0)
pH: 7 (ref 5.0–8.0)

## 2023-10-02 LAB — BASIC METABOLIC PANEL
BUN: 11 mg/dL (ref 6–23)
CO2: 29 meq/L (ref 19–32)
Calcium: 9.3 mg/dL (ref 8.4–10.5)
Chloride: 100 meq/L (ref 96–112)
Creatinine, Ser: 0.56 mg/dL (ref 0.40–1.20)
GFR: 98.08 mL/min (ref >60.00)
Glucose, Bld: 105 mg/dL — ABNORMAL HIGH (ref 70–99)
Potassium: 3.6 meq/L (ref 3.5–5.1)
Sodium: 138 meq/L (ref 135–145)

## 2023-10-02 LAB — LIPID PANEL
Cholesterol: 131 mg/dL (ref 0–200)
HDL: 46.4 mg/dL (ref >39.00)
LDL Cholesterol: 69 mg/dL (ref 0–99)
NonHDL: 84.81
Total CHOL/HDL Ratio: 3
Triglycerides: 80 mg/dL (ref 0.0–149.0)
VLDL: 16 mg/dL (ref 0.0–40.0)

## 2023-10-02 LAB — MICROALBUMIN / CREATININE URINE RATIO
Creatinine,U: 162.3 mg/dL
Microalb Creat Ratio: 0.9 mg/g (ref 0.0–30.0)
Microalb, Ur: 1.4 mg/dL (ref 0.0–1.9)

## 2023-10-02 LAB — HEMOGLOBIN A1C: Hgb A1c MFr Bld: 6.4 % (ref 4.6–6.5)

## 2023-10-02 NOTE — Progress Notes (Signed)
Established Patient Office Visit   Subjective:  Patient ID: [name], female    DOB: Aug 21, 1961  Age: 63 y.o. MRN: [id number]  Chief Complaint  Patient presents with   Medical Management of Chronic Issues    6 month follow up. Pt is fasting. No concerns.    HPI Encounter Diagnoses  Name Primary?   Immunization due Yes   Type 2 diabetes mellitus without complication, without long-term current use of insulin (HCC)    Essential hypertension    Elevated LDL cholesterol level    Doing well.  Compliant with all of her medications.  Has lowered the fat and cholesterol in her diet.  Continues to exercise by walking.   Review of Systems  Constitutional: Negative.   HENT: Negative.    Eyes:  Negative for blurred vision, discharge and redness.  Respiratory: Negative.    Cardiovascular: Negative.   Gastrointestinal:  Negative for abdominal pain.  Genitourinary: Negative.   Musculoskeletal: Negative.  Negative for myalgias.  Skin:  Negative for rash.  Neurological:  Negative for tingling, loss of consciousness and weakness.  Endo/Heme/Allergies:  Negative for polydipsia.     Current Outpatient Medications:    aspirin 81 MG tablet, Take 81 mg by mouth daily., Disp: , Rfl:    atorvastatin (LIPITOR) 40 MG tablet, Take 1 tablet (40 mg total) by mouth daily., Disp: 90 tablet, Rfl: 3   Blood Pressure Monitoring (OMRON 3 SERIES BP MONITOR) DEVI, Use as directed, Disp: 1 each, Rfl: 0   Calcium-Vitamin D (CALTRATE 600 PLUS-VIT D PO), Take 1 tablet by mouth 2 (two) times daily., Disp: , Rfl:    esomeprazole (NEXIUM) 40 MG capsule, Take 1 capsule (40 mg total) by mouth at bedtime., Disp: 90 capsule, Rfl: 3   glucose blood test strip, Use to check blood sugar daily., Disp: 100 each, Rfl: 3   glucose monitoring kit (FREESTYLE) monitoring kit, 1 each by Does not apply route as needed for other., Disp: 1 each, Rfl: 0   imipramine (TOFRANIL) 50 MG tablet, Take 1 tablet (50 mg total) by mouth  at bedtime., Disp: 90 tablet, Rfl: 3   Lancets MISC, USE TO CHECK BLOOD SUGAR DAILY., Disp: 100 each, Rfl: 3   metFORMIN (GLUCOPHAGE-XR) 500 MG 24 hr tablet, Take 2 tablets (1,000 mg total) by mouth 2 (two) times daily before a meal., Disp: 360 tablet, Rfl: 3   Semaglutide, 2 MG/DOSE, (OZEMPIC, 2 MG/DOSE,) 8 MG/3ML SOPN, Inject 2 mg as directed once a week., Disp: 3 mL, Rfl: 5   amLODipine-olmesartan (AZOR) 10-40 MG tablet, TAKE 1 TABLET BY MOUTH DAILY., Disp: 90 tablet, Rfl: 3  Current Facility-Administered Medications:    0.9 %  sodium chloride infusion, 500 mL, Intravenous, Once, Hilarie Fredrickson, MD   Objective:     BP 122/68   Pulse (!) 110   Temp (!) 97.1 F (36.2 C)   Ht 5\' 3"  (1.6 m)   Wt 168 lb (76.2 kg)   SpO2 98%   BMI 29.76 kg/m  BP Readings from Last 3 Encounters:  10/02/23 122/68  04/01/23 134/72  09/16/22 118/68   Wt Readings from Last 3 Encounters:  10/02/23 168 lb (76.2 kg)  04/01/23 168 lb 12.8 oz (76.6 kg)  09/16/22 170 lb 6.4 oz (77.3 kg)      Physical Exam Constitutional:      General: She is not in acute distress.    Appearance: Normal appearance. She is not ill-appearing, toxic-appearing or diaphoretic.  HENT:  Head: Normocephalic and atraumatic.     Right Ear: External ear normal.     Left Ear: External ear normal.     Mouth/Throat:     Mouth: Mucous membranes are moist.     Pharynx: Oropharynx is clear. No oropharyngeal exudate or posterior oropharyngeal erythema.  Eyes:     General: No scleral icterus.       Right eye: No discharge.        Left eye: No discharge.     Extraocular Movements: Extraocular movements intact.     Conjunctiva/sclera: Conjunctivae normal.     Pupils: Pupils are equal, round, and reactive to light.  Cardiovascular:     Rate and Rhythm: Normal rate and regular rhythm.     Pulses:          Dorsalis pedis pulses are 1+ on the right side and 1+ on the left side.       Posterior tibial pulses are 2+ on the right side  and 2+ on the left side.  Pulmonary:     Effort: Pulmonary effort is normal. No respiratory distress.     Breath sounds: Normal breath sounds. No wheezing or rales.  Abdominal:     General: Bowel sounds are normal.  Musculoskeletal:     Cervical back: No rigidity or tenderness.  Skin:    General: Skin is warm and dry.  Neurological:     Mental Status: She is alert and oriented to person, place, and time.  Psychiatric:        Mood and Affect: Mood normal.        Behavior: Behavior normal.    Diabetic Foot Exam - Simple   Simple Foot Form Diabetic Foot exam was performed with the following findings: Yes 10/02/2023  9:04 AM  Visual Inspection No deformities, no ulcerations, no other skin breakdown bilaterally: Yes Sensation Testing See comments: Yes Pulse Check Posterior Tibialis and Dorsalis pulse intact bilaterally: Yes Comments Feet are standard bilaterally.  There is some fine scaling.      No results found for any visits on 10/02/23.    The 10-year ASCVD risk score (Arnett DK, et al., 2019) is: 7.5%    Assessment & Plan:   Immunization due -     Tdap vaccine greater than or equal to 7yo IM -     Pneumococcal conjugate vaccine 20-valent  Type 2 diabetes mellitus without complication, without long-term current use of insulin (HCC) -     Basic metabolic panel -     Urinalysis, Routine w reflex microscopic -     Microalbumin / creatinine urine ratio -     Hemoglobin A1c  Essential hypertension -     Basic metabolic panel -     Hemoglobin A1c  Elevated LDL cholesterol level -     Lipid panel    Return in about 6 months (around 03/31/2024).  Information was given on managing cholesterol and exercising to lose weight.  Continue her healthy active lifestyle.  Follow-up as scheduled with her GYN doc next month.  Tdap and Prevnar today.  Continue all medications as above.  Mliss Sax, MD

## 2023-10-05 ENCOUNTER — Encounter: Payer: Self-pay | Admitting: Family Medicine

## 2023-10-22 ENCOUNTER — Other Ambulatory Visit (HOSPITAL_COMMUNITY): Payer: Self-pay

## 2023-11-18 DIAGNOSIS — Z1231 Encounter for screening mammogram for malignant neoplasm of breast: Secondary | ICD-10-CM | POA: Diagnosis not present

## 2023-11-18 DIAGNOSIS — Z6829 Body mass index (BMI) 29.0-29.9, adult: Secondary | ICD-10-CM | POA: Diagnosis not present

## 2023-11-18 DIAGNOSIS — Z13820 Encounter for screening for osteoporosis: Secondary | ICD-10-CM | POA: Diagnosis not present

## 2023-11-18 DIAGNOSIS — Z01419 Encounter for gynecological examination (general) (routine) without abnormal findings: Secondary | ICD-10-CM | POA: Diagnosis not present

## 2023-11-25 DIAGNOSIS — H53143 Visual discomfort, bilateral: Secondary | ICD-10-CM | POA: Diagnosis not present

## 2023-11-25 DIAGNOSIS — E119 Type 2 diabetes mellitus without complications: Secondary | ICD-10-CM | POA: Diagnosis not present

## 2023-11-25 LAB — HM DIABETES EYE EXAM

## 2023-11-26 ENCOUNTER — Other Ambulatory Visit (HOSPITAL_COMMUNITY): Payer: Self-pay

## 2023-11-26 DIAGNOSIS — I720 Aneurysm of carotid artery: Secondary | ICD-10-CM | POA: Diagnosis not present

## 2023-11-26 MED ORDER — DOXYCYCLINE HYCLATE 100 MG PO CAPS
100.0000 mg | ORAL_CAPSULE | Freq: Two times a day (BID) | ORAL | 1 refills | Status: AC
  Filled 2023-11-26: qty 28, 14d supply, fill #0

## 2024-01-11 ENCOUNTER — Other Ambulatory Visit (HOSPITAL_COMMUNITY): Payer: Self-pay

## 2024-01-11 DIAGNOSIS — N390 Urinary tract infection, site not specified: Secondary | ICD-10-CM | POA: Diagnosis not present

## 2024-01-11 DIAGNOSIS — R350 Frequency of micturition: Secondary | ICD-10-CM | POA: Diagnosis not present

## 2024-01-11 MED ORDER — NITROFURANTOIN MONOHYD MACRO 100 MG PO CAPS
100.0000 mg | ORAL_CAPSULE | Freq: Two times a day (BID) | ORAL | 0 refills | Status: AC
  Filled 2024-01-11: qty 14, 7d supply, fill #0

## 2024-02-02 ENCOUNTER — Other Ambulatory Visit (HOSPITAL_COMMUNITY): Payer: Self-pay

## 2024-02-02 ENCOUNTER — Other Ambulatory Visit: Payer: Self-pay

## 2024-02-02 ENCOUNTER — Other Ambulatory Visit: Payer: Self-pay | Admitting: Family Medicine

## 2024-02-02 DIAGNOSIS — E1165 Type 2 diabetes mellitus with hyperglycemia: Secondary | ICD-10-CM

## 2024-02-02 DIAGNOSIS — I10 Essential (primary) hypertension: Secondary | ICD-10-CM

## 2024-02-02 DIAGNOSIS — E119 Type 2 diabetes mellitus without complications: Secondary | ICD-10-CM

## 2024-02-02 MED ORDER — AMLODIPINE-OLMESARTAN 10-40 MG PO TABS
1.0000 | ORAL_TABLET | Freq: Every day | ORAL | 3 refills | Status: AC
  Filled 2024-02-02: qty 60, 60d supply, fill #0
  Filled 2024-02-02: qty 30, 30d supply, fill #0
  Filled 2024-02-02: qty 60, 60d supply, fill #0
  Filled 2024-08-22: qty 90, 90d supply, fill #1

## 2024-02-02 MED ORDER — OZEMPIC (2 MG/DOSE) 8 MG/3ML ~~LOC~~ SOPN
2.0000 mg | PEN_INJECTOR | SUBCUTANEOUS | 5 refills | Status: DC
  Filled 2024-02-02: qty 3, 28d supply, fill #0
  Filled 2024-03-04: qty 3, 28d supply, fill #1
  Filled 2024-04-11: qty 3, 28d supply, fill #2
  Filled 2024-05-11: qty 3, 28d supply, fill #3
  Filled 2024-06-10: qty 3, 28d supply, fill #4
  Filled 2024-07-05: qty 3, 28d supply, fill #5

## 2024-02-02 MED ORDER — METFORMIN HCL ER 500 MG PO TB24
1000.0000 mg | ORAL_TABLET | Freq: Two times a day (BID) | ORAL | 3 refills | Status: AC
  Filled 2024-02-02: qty 360, 90d supply, fill #0
  Filled 2024-08-22: qty 360, 90d supply, fill #1

## 2024-02-15 ENCOUNTER — Other Ambulatory Visit (HOSPITAL_COMMUNITY): Payer: Self-pay

## 2024-02-15 ENCOUNTER — Ambulatory Visit: Payer: Self-pay

## 2024-02-15 ENCOUNTER — Ambulatory Visit: Admitting: Family Medicine

## 2024-02-15 ENCOUNTER — Encounter: Payer: Self-pay | Admitting: Family Medicine

## 2024-02-15 VITALS — BP 138/72 | HR 111 | Temp 98.2°F | Ht 63.0 in | Wt 165.2 lb

## 2024-02-15 DIAGNOSIS — R079 Chest pain, unspecified: Secondary | ICD-10-CM | POA: Diagnosis not present

## 2024-02-15 DIAGNOSIS — G629 Polyneuropathy, unspecified: Secondary | ICD-10-CM

## 2024-02-15 DIAGNOSIS — B029 Zoster without complications: Secondary | ICD-10-CM

## 2024-02-15 MED ORDER — GABAPENTIN 100 MG PO CAPS
ORAL_CAPSULE | ORAL | 0 refills | Status: DC
Start: 2024-02-15 — End: 2024-03-10
  Filled 2024-02-15: qty 81, 30d supply, fill #0

## 2024-02-15 MED ORDER — VALACYCLOVIR HCL 1 G PO TABS
1000.0000 mg | ORAL_TABLET | Freq: Three times a day (TID) | ORAL | 0 refills | Status: AC
  Filled 2024-02-15: qty 21, 7d supply, fill #0

## 2024-02-15 NOTE — Progress Notes (Signed)
 Established Patient Office Visit   Subjective:  Patient ID: [name], female    DOB: July 02, 1961  Age: 63 y.o. MRN: [id number]  Chief Complaint  Patient presents with   Chest Pain    Chest Pain  Pertinent negatives include no abdominal pain, diaphoresis, nausea, shortness of breath, vomiting or weakness.   Encounter Diagnoses  Name Primary?   Chest pain, unspecified type Yes   Herpes zoster without complication    Neuropathy    5-day history of a burning pain in her left axilla, lateral left chest and lower left anterior chest.  Denies shortness of breath, diaphoresis nausea or vomiting.  No abdominal pain.  No rash.  No neck pain.  She has completed the Shingrix vaccine series.  No increase in stress.  She has noted a pain in her left posterior shoulder.  Skin is sensitive to light touch.   Review of Systems  Constitutional: Negative.  Negative for diaphoresis.  HENT: Negative.    Eyes:  Negative for blurred vision, discharge and redness.  Respiratory: Negative.  Negative for shortness of breath.   Cardiovascular:  Positive for chest pain.  Gastrointestinal:  Negative for abdominal pain, nausea and vomiting.  Genitourinary: Negative.   Musculoskeletal: Negative.  Negative for myalgias.  Skin:  Negative for rash.  Neurological:  Negative for tingling, loss of consciousness and weakness.  Endo/Heme/Allergies:  Negative for polydipsia.     Current Outpatient Medications:    amLODipine -olmesartan  (AZOR ) 10-40 MG tablet, Take 1 tablet by mouth daily., Disp: 90 tablet, Rfl: 3   aspirin 81 MG tablet, Take 81 mg by mouth daily., Disp: , Rfl:    atorvastatin  (LIPITOR) 40 MG tablet, Take 1 tablet (40 mg total) by mouth daily., Disp: 90 tablet, Rfl: 3   Blood Pressure Monitoring (OMRON 3 SERIES BP MONITOR) DEVI, Use as directed, Disp: 1 each, Rfl: 0   Calcium -Vitamin D  (CALTRATE 600 PLUS-VIT D PO), Take 1 tablet by mouth 2 (two) times daily., Disp: , Rfl:    doxycycline   (VIBRAMYCIN ) 100 MG capsule, Take 1 capsule (100 mg total) by mouth 2 (two) times daily with food/ water. Caution increased photosensitivity., Disp: 28 capsule, Rfl: 1   esomeprazole  (NEXIUM ) 40 MG capsule, Take 1 capsule (40 mg total) by mouth at bedtime., Disp: 90 capsule, Rfl: 3   gabapentin (NEURONTIN) 100 MG capsule, Take 1 capsule (100 mg total) by mouth at bedtime for 3 days, THEN 1 capsule (100 mg total) 2 (two) times daily for 3 days, THEN 1 capsule (100 mg total) 3 (three) times daily for 24 days., Disp: 81 capsule, Rfl: 0   glucose blood test strip, Use to check blood sugar daily., Disp: 100 each, Rfl: 3   glucose monitoring kit (FREESTYLE) monitoring kit, 1 each by Does not apply route as needed for other., Disp: 1 each, Rfl: 0   imipramine  (TOFRANIL ) 50 MG tablet, Take 1 tablet (50 mg total) by mouth at bedtime., Disp: 90 tablet, Rfl: 3   Lancets MISC, USE TO CHECK BLOOD SUGAR DAILY., Disp: 100 each, Rfl: 3   metFORMIN  (GLUCOPHAGE -XR) 500 MG 24 hr tablet, Take 2 tablets (1,000 mg total) by mouth 2 (two) times daily before a meal., Disp: 360 tablet, Rfl: 3   nitrofurantoin , macrocrystal-monohydrate, (MACROBID ) 100 MG capsule, Take 1 capsule (100 mg total) by mouth every 12 (twelve) hours for 7 days., Disp: 14 capsule, Rfl: 0   Semaglutide , 2 MG/DOSE, (OZEMPIC , 2 MG/DOSE,) 8 MG/3ML SOPN, Inject 2 mg into the  skin once a week., Disp: 3 mL, Rfl: 5   valACYclovir (VALTREX) 1000 MG tablet, Take 1 tablet (1,000 mg total) by mouth 3 (three) times daily for 7 days., Disp: 21 tablet, Rfl: 0  Current Facility-Administered Medications:    0.9 %  sodium chloride  infusion, 500 mL, Intravenous, Once, Tobin Forts, MD   Objective:     BP 138/72 (Cuff Size: Normal)   Pulse (!) 111   Temp 98.2 F (36.8 C) (Temporal)   Ht 5\' 3"  (1.6 m)   Wt 165 lb 3.2 oz (74.9 kg)   SpO2 97%   BMI 29.26 kg/m    Physical Exam Constitutional:      General: She is not in acute distress.    Appearance: Normal  appearance. She is not ill-appearing, toxic-appearing or diaphoretic.  HENT:     Head: Normocephalic and atraumatic.     Right Ear: External ear normal.     Left Ear: External ear normal.  Eyes:     General: No scleral icterus.       Right eye: No discharge.        Left eye: No discharge.     Extraocular Movements: Extraocular movements intact.     Conjunctiva/sclera: Conjunctivae normal.  Cardiovascular:     Rate and Rhythm: Normal rate and regular rhythm.     Heart sounds: Normal heart sounds.  Pulmonary:     Effort: Pulmonary effort is normal. No respiratory distress.  Skin:    General: Skin is warm and dry.     Findings: No bruising, ecchymosis, erythema or rash.  Neurological:     Mental Status: She is alert and oriented to person, place, and time.  Psychiatric:        Mood and Affect: Mood normal.        Behavior: Behavior normal.      No results found for any visits on 02/15/24.    The 10-year ASCVD risk score (Arnett DK, et al., 2019) is: 9.9%    Assessment & Plan:   Chest pain, unspecified type -     EKG 12-Lead  Herpes zoster without complication -     Gabapentin; Take 1 capsule (100 mg total) by mouth at bedtime for 3 days, THEN 1 capsule (100 mg total) 2 (two) times daily for 3 days, THEN 1 capsule (100 mg total) 3 (three) times daily for 24 days.  Dispense: 81 capsule; Refill: 0 -     valACYclovir HCl; Take 1 tablet (1,000 mg total) by mouth 3 (three) times daily for 7 days.  Dispense: 21 tablet; Refill: 0  Neuropathy -     Gabapentin; Take 1 capsule (100 mg total) by mouth at bedtime for 3 days, THEN 1 capsule (100 mg total) 2 (two) times daily for 3 days, THEN 1 capsule (100 mg total) 3 (three) times daily for 24 days.  Dispense: 81 capsule; Refill: 0    Return in about 2 weeks (around 02/29/2024).  Symptoms sound like zoster but there is no rash in multiple dermatomes are involved.  She did have the Shingrix vaccine.  Presentation could be altered  secondary to Shingrix vaccine.  Will go ahead and treat with Valtrex and started on gabapentin.  Follow-up in 2 weeks.  EKG with pain showed normal sinus rhythm with a rate of 105.  There were no ST segment or T wave changes to suggest cardiac ischemia.  Tonna Frederic, MD

## 2024-02-15 NOTE — Telephone Encounter (Signed)
 FYI Only or Action Required?: FYI only for provider  Patient was last seen in primary care on 10/02/2023 by Tonna Frederic, MD. Called Nurse Triage reporting Burning sensation/pain on skin of left arm and spreading across chest. Symptoms began a week ago. Interventions attempted: Nothing. Symptoms are: gradually worsening.  Triage Disposition: See HCP Within 4 Hours (Or PCP Triage)  Patient/caregiver understands and will follow disposition?: Yes     Copied from CRM [id number]. Topic: Clinical - Red Word Triage >> Feb 15, 2024  8:10 AM Howard Macho wrote: Reason for CRM: patient called stating her left arm under her left breast is burning on the inside since last Tuesday. Patient stated its like she scraped her arm on the concrete wall and she stated it is going to the right side Reason for Disposition  Nursing judgment or information in reference  Answer Assessment - Initial Assessment Questions No Guideline Available:  Patient reports to have a burning pain that started last Tuesday on the inside of her left arm. Patient describes it as scraping her arm on a rough concrete wall. Patient does not have any visible signs of skin irritation/shingles/rash. Patient states since last Tuesday, the burning feeling has spread across chest. Patient states it is not constant. Yesterday she did not experience it at all. Patient denies any changes in medication, any history of shingles (states she has had shingles vaccine), any cardiac history, any crushing/pressure chest pain, nausea, GI upset, difficulty breathing, arm pain, back pain, jaw pain.   Patient states she walks and has to hold breast because of burning pain. Earliest availability made.  Protocols used: No Guideline Available-A-[facility]

## 2024-03-04 ENCOUNTER — Ambulatory Visit: Admitting: Family Medicine

## 2024-03-10 ENCOUNTER — Encounter: Payer: Self-pay | Admitting: Family Medicine

## 2024-03-10 ENCOUNTER — Other Ambulatory Visit (HOSPITAL_COMMUNITY): Payer: Self-pay

## 2024-03-10 ENCOUNTER — Encounter (HOSPITAL_COMMUNITY): Payer: Self-pay

## 2024-03-10 ENCOUNTER — Ambulatory Visit: Admitting: Family Medicine

## 2024-03-10 VITALS — BP 122/72 | HR 94 | Temp 97.5°F | Ht 63.0 in | Wt 164.0 lb

## 2024-03-10 DIAGNOSIS — M5414 Radiculopathy, thoracic region: Secondary | ICD-10-CM

## 2024-03-10 DIAGNOSIS — G540 Brachial plexus disorders: Secondary | ICD-10-CM

## 2024-03-10 DIAGNOSIS — M5412 Radiculopathy, cervical region: Secondary | ICD-10-CM

## 2024-03-10 MED ORDER — GABAPENTIN 100 MG PO CAPS
200.0000 mg | ORAL_CAPSULE | Freq: Three times a day (TID) | ORAL | 3 refills | Status: AC
  Filled 2024-03-10 – 2024-08-22 (×3): qty 180, 30d supply, fill #0

## 2024-03-10 NOTE — Progress Notes (Signed)
 Established Patient Office Visit   Subjective:  Patient ID: [name], female    DOB: December 01, 1960  Age: 63 y.o. MRN: [id number]  Chief Complaint  Patient presents with   Follow-up    Still having pain, burning under left arm back side of arm and down her side.gabapentin  not helping.     HPI Encounter Diagnoses  Name Primary?   Thoracic radiculopathy Yes   Thoracic outlet syndrome    Cervical radiculopathy at C8    Patient reports ongoing symptoms that include burning paresthesias in her left medial arm and from the axilla into the lateral left chest wall.  There is no exertional component, shortness of breath, nausea or diarrhea races.  Fortunately discomfort has decreased.  Was not sure if the gabapentin  or Valtrex  made a difference.   Review of Systems  Constitutional: Negative.   HENT: Negative.    Eyes:  Negative for blurred vision, discharge and redness.  Respiratory: Negative.    Cardiovascular: Negative.   Gastrointestinal:  Negative for abdominal pain.  Genitourinary: Negative.   Musculoskeletal: Negative.  Negative for myalgias.  Skin:  Negative for rash.  Neurological:  Negative for tingling, loss of consciousness and weakness.  Endo/Heme/Allergies:  Negative for polydipsia.     Current Outpatient Medications:    amLODipine -olmesartan  (AZOR ) 10-40 MG tablet, Take 1 tablet by mouth daily., Disp: 90 tablet, Rfl: 3   aspirin 81 MG tablet, Take 81 mg by mouth daily., Disp: , Rfl:    atorvastatin  (LIPITOR) 40 MG tablet, Take 1 tablet (40 mg total) by mouth daily., Disp: 90 tablet, Rfl: 3   Blood Pressure Monitoring (OMRON 3 SERIES BP MONITOR) DEVI, Use as directed, Disp: 1 each, Rfl: 0   Calcium -Vitamin D  (CALTRATE 600 PLUS-VIT D PO), Take 1 tablet by mouth 2 (two) times daily., Disp: , Rfl:    esomeprazole  (NEXIUM ) 40 MG capsule, Take 1 capsule (40 mg total) by mouth at bedtime., Disp: 90 capsule, Rfl: 3   gabapentin  (NEURONTIN ) 100 MG capsule, Take 2 capsules  (200 mg total) by mouth 3 (three) times daily., Disp: 180 capsule, Rfl: 3   glucose blood test strip, Use to check blood sugar daily., Disp: 100 each, Rfl: 3   glucose monitoring kit (FREESTYLE) monitoring kit, 1 each by Does not apply route as needed for other., Disp: 1 each, Rfl: 0   imipramine  (TOFRANIL ) 50 MG tablet, Take 1 tablet (50 mg total) by mouth at bedtime., Disp: 90 tablet, Rfl: 3   Lancets MISC, USE TO CHECK BLOOD SUGAR DAILY., Disp: 100 each, Rfl: 3   metFORMIN  (GLUCOPHAGE -XR) 500 MG 24 hr tablet, Take 2 tablets (1,000 mg total) by mouth 2 (two) times daily before a meal., Disp: 360 tablet, Rfl: 3   Semaglutide , 2 MG/DOSE, (OZEMPIC , 2 MG/DOSE,) 8 MG/3ML SOPN, Inject 2 mg into the skin once a week., Disp: 3 mL, Rfl: 5   doxycycline  (VIBRAMYCIN ) 100 MG capsule, Take 1 capsule (100 mg total) by mouth 2 (two) times daily with food/ water. Caution increased photosensitivity. (Patient not taking: Reported on 03/10/2024), Disp: 28 capsule, Rfl: 1   nitrofurantoin , macrocrystal-monohydrate, (MACROBID ) 100 MG capsule, Take 1 capsule (100 mg total) by mouth every 12 (twelve) hours for 7 days. (Patient not taking: Reported on 03/10/2024), Disp: 14 capsule, Rfl: 0  Current Facility-Administered Medications:    0.9 %  sodium chloride  infusion, 500 mL, Intravenous, Once, [name], MD   Objective:     BP 122/72 (BP Location: Right Arm,  Patient Position: Sitting, Cuff Size: Normal)   Pulse 94   Temp (!) 97.5 F (36.4 C) (Temporal)   Ht 5' 3 (1.6 m)   Wt 164 lb (74.4 kg)   SpO2 96%   BMI 29.05 kg/m    Physical Exam Constitutional:      General: She is not in acute distress.    Appearance: Normal appearance. She is not ill-appearing, toxic-appearing or diaphoretic.  HENT:     Head: Normocephalic and atraumatic.     Right Ear: External ear normal.     Left Ear: External ear normal.  Eyes:     General: No scleral icterus.       Right eye: No discharge.        Left eye: No  discharge.     Extraocular Movements: Extraocular movements intact.     Conjunctiva/sclera: Conjunctivae normal.  Cardiovascular:     Rate and Rhythm: Normal rate and regular rhythm.  Pulmonary:     Effort: Pulmonary effort is normal. No respiratory distress.     Breath sounds: No wheezing, rhonchi or rales.  Musculoskeletal:     Cervical back: No bony tenderness. Pain with movement present. Normal range of motion.     Thoracic back: No tenderness or bony tenderness. Normal range of motion.       Back:  Skin:    General: Skin is warm and dry.  Neurological:     Mental Status: She is alert and oriented to person, place, and time.  Psychiatric:        Mood and Affect: Mood normal.        Behavior: Behavior normal.      No results found for any visits on 03/10/24.    The 10-year ASCVD risk score (Arnett DK, et al., 2019) is: 7.8%    Assessment & Plan:   Thoracic radiculopathy -     MR THORACIC SPINE WO CONTRAST; Future -     Ambulatory referral to Sports Medicine -     Gabapentin ; Take 2 capsules (200 mg total) by mouth 3 (three) times daily.  Dispense: 180 capsule; Refill: 3  Thoracic outlet syndrome -     MR CERVICAL SPINE WO CONTRAST; Future -     MR THORACIC SPINE WO CONTRAST; Future -     Ambulatory referral to Sports Medicine -     Gabapentin ; Take 2 capsules (200 mg total) by mouth 3 (three) times daily.  Dispense: 180 capsule; Refill: 3  Cervical radiculopathy at C8 -     MR CERVICAL SPINE WO CONTRAST; Future -     Ambulatory referral to Sports Medicine -     Gabapentin ; Take 2 capsules (200 mg total) by mouth 3 (three) times daily.  Dispense: 180 capsule; Refill: 3    Return if symptoms worsen or fail to improve.  MRIs of the cervical and thoracic spines ordered.  Increased dose of gabapentin  to 200 mg 3 times daily.[name]  Sports medicine referral.  Please return to see me if needed.  Elsie Sim Lent, MD

## 2024-03-14 ENCOUNTER — Other Ambulatory Visit (HOSPITAL_COMMUNITY): Payer: Self-pay

## 2024-03-15 ENCOUNTER — Encounter: Payer: Self-pay | Admitting: Family Medicine

## 2024-03-16 ENCOUNTER — Ambulatory Visit: Admitting: Sports Medicine

## 2024-03-23 ENCOUNTER — Ambulatory Visit
Admission: RE | Admit: 2024-03-23 | Discharge: 2024-03-23 | Disposition: A | Discharge status: Home / Self Care | Source: Ambulatory Visit | Attending: Family Medicine | Admitting: Family Medicine

## 2024-03-23 DIAGNOSIS — G540 Brachial plexus disorders: Secondary | ICD-10-CM

## 2024-03-23 DIAGNOSIS — M4802 Spinal stenosis, cervical region: Secondary | ICD-10-CM | POA: Diagnosis not present

## 2024-03-23 DIAGNOSIS — M5414 Radiculopathy, thoracic region: Secondary | ICD-10-CM

## 2024-03-23 DIAGNOSIS — M5412 Radiculopathy, cervical region: Secondary | ICD-10-CM

## 2024-03-23 DIAGNOSIS — M4804 Spinal stenosis, thoracic region: Secondary | ICD-10-CM | POA: Diagnosis not present

## 2024-03-24 ENCOUNTER — Other Ambulatory Visit (HOSPITAL_COMMUNITY): Payer: Self-pay

## 2024-03-25 ENCOUNTER — Ambulatory Visit: Payer: Self-pay | Admitting: Family Medicine

## 2024-04-01 ENCOUNTER — Ambulatory Visit: Payer: Commercial Managed Care - PPO | Admitting: Family Medicine

## 2024-05-13 ENCOUNTER — Other Ambulatory Visit (HOSPITAL_COMMUNITY): Payer: Self-pay

## 2024-06-10 ENCOUNTER — Other Ambulatory Visit (HOSPITAL_COMMUNITY): Payer: Self-pay

## 2024-06-20 ENCOUNTER — Other Ambulatory Visit (HOSPITAL_COMMUNITY): Payer: Self-pay

## 2024-07-05 ENCOUNTER — Other Ambulatory Visit: Payer: Self-pay

## 2024-07-05 ENCOUNTER — Other Ambulatory Visit: Payer: Self-pay | Admitting: Medical Genetics

## 2024-07-05 ENCOUNTER — Other Ambulatory Visit (HOSPITAL_COMMUNITY): Payer: Self-pay

## 2024-07-05 ENCOUNTER — Other Ambulatory Visit: Payer: Self-pay | Admitting: Family Medicine

## 2024-07-05 DIAGNOSIS — E785 Hyperlipidemia, unspecified: Secondary | ICD-10-CM

## 2024-07-05 DIAGNOSIS — K219 Gastro-esophageal reflux disease without esophagitis: Secondary | ICD-10-CM

## 2024-07-05 DIAGNOSIS — Z006 Encounter for examination for normal comparison and control in clinical research program: Secondary | ICD-10-CM

## 2024-07-05 DIAGNOSIS — M792 Neuralgia and neuritis, unspecified: Secondary | ICD-10-CM

## 2024-07-05 MED ORDER — ATORVASTATIN CALCIUM 40 MG PO TABS
40.0000 mg | ORAL_TABLET | Freq: Every day | ORAL | 3 refills | Status: AC
  Filled 2024-07-05: qty 90, 90d supply, fill #0

## 2024-07-05 MED ORDER — ESOMEPRAZOLE MAGNESIUM 40 MG PO CPDR
40.0000 mg | DELAYED_RELEASE_CAPSULE | Freq: Every evening | ORAL | 3 refills | Status: AC
  Filled 2024-07-05: qty 90, 90d supply, fill #0

## 2024-07-05 MED ORDER — IMIPRAMINE HCL 50 MG PO TABS
50.0000 mg | ORAL_TABLET | Freq: Every day | ORAL | 3 refills | Status: AC
  Filled 2024-07-05: qty 90, 90d supply, fill #0

## 2024-07-08 ENCOUNTER — Other Ambulatory Visit (HOSPITAL_COMMUNITY): Payer: Self-pay

## 2024-08-22 ENCOUNTER — Other Ambulatory Visit: Payer: Self-pay | Admitting: Family Medicine

## 2024-08-22 ENCOUNTER — Other Ambulatory Visit (HOSPITAL_COMMUNITY): Payer: Self-pay

## 2024-08-22 DIAGNOSIS — E119 Type 2 diabetes mellitus without complications: Secondary | ICD-10-CM

## 2024-08-24 ENCOUNTER — Other Ambulatory Visit (HOSPITAL_COMMUNITY): Payer: Self-pay

## 2024-08-24 MED ORDER — OZEMPIC (2 MG/DOSE) 8 MG/3ML ~~LOC~~ SOPN
2.0000 mg | PEN_INJECTOR | SUBCUTANEOUS | 5 refills | Status: AC
  Filled 2024-08-24: qty 3, 28d supply, fill #0
  Filled 2024-09-22: qty 3, 28d supply, fill #1
